# Patient Record
Sex: Male | Born: 1960
Health system: Southern US, Community
[De-identification: ages and names within clinical notes are randomized; demographics above are authoritative.]

## PROBLEM LIST (undated history)

## (undated) DIAGNOSIS — U071 COVID-19: Secondary | ICD-10-CM

## (undated) DIAGNOSIS — I10 Essential (primary) hypertension: Secondary | ICD-10-CM

## (undated) DIAGNOSIS — I493 Ventricular premature depolarization: Secondary | ICD-10-CM

## (undated) DIAGNOSIS — R0789 Other chest pain: Secondary | ICD-10-CM

## (undated) DIAGNOSIS — I4891 Unspecified atrial fibrillation: Secondary | ICD-10-CM

## (undated) DIAGNOSIS — R001 Bradycardia, unspecified: Secondary | ICD-10-CM

## (undated) DIAGNOSIS — K219 Gastro-esophageal reflux disease without esophagitis: Secondary | ICD-10-CM

## (undated) DIAGNOSIS — I491 Atrial premature depolarization: Secondary | ICD-10-CM

## (undated) DIAGNOSIS — Z8489 Family history of other specified conditions: Secondary | ICD-10-CM

## (undated) HISTORY — PX: NO PAST SURGERIES: SHX2092

## (undated) HISTORY — PX: COLONOSCOPY: SHX174

## (undated) HISTORY — DX: Other chest pain: R07.89

## (undated) HISTORY — DX: Ventricular premature depolarization: I49.3

## (undated) HISTORY — DX: Bradycardia, unspecified: R00.1

## (undated) HISTORY — DX: Gastro-esophageal reflux disease without esophagitis: K21.9

## (undated) HISTORY — DX: Atrial premature depolarization: I49.1

---

## 2007-01-04 ENCOUNTER — Ambulatory Visit (HOSPITAL_COMMUNITY): Admission: RE | Admit: 2007-01-04 | Discharge: 2007-01-04 | Payer: Self-pay | Admitting: Family Medicine

## 2009-12-24 ENCOUNTER — Encounter: Admission: RE | Admit: 2009-12-24 | Discharge: 2009-12-24 | Payer: Self-pay | Admitting: Neurosurgery

## 2012-03-27 DIAGNOSIS — Z125 Encounter for screening for malignant neoplasm of prostate: Secondary | ICD-10-CM | POA: Diagnosis not present

## 2012-03-27 DIAGNOSIS — Z23 Encounter for immunization: Secondary | ICD-10-CM | POA: Diagnosis not present

## 2012-03-27 DIAGNOSIS — J309 Allergic rhinitis, unspecified: Secondary | ICD-10-CM | POA: Diagnosis not present

## 2012-03-27 DIAGNOSIS — F329 Major depressive disorder, single episode, unspecified: Secondary | ICD-10-CM | POA: Diagnosis not present

## 2012-03-27 DIAGNOSIS — I10 Essential (primary) hypertension: Secondary | ICD-10-CM | POA: Diagnosis not present

## 2012-05-08 DIAGNOSIS — M47812 Spondylosis without myelopathy or radiculopathy, cervical region: Secondary | ICD-10-CM | POA: Diagnosis not present

## 2012-11-27 DIAGNOSIS — M47812 Spondylosis without myelopathy or radiculopathy, cervical region: Secondary | ICD-10-CM | POA: Diagnosis not present

## 2013-03-07 DIAGNOSIS — M47812 Spondylosis without myelopathy or radiculopathy, cervical region: Secondary | ICD-10-CM | POA: Diagnosis not present

## 2013-04-09 DIAGNOSIS — I10 Essential (primary) hypertension: Secondary | ICD-10-CM | POA: Diagnosis not present

## 2013-04-09 DIAGNOSIS — F329 Major depressive disorder, single episode, unspecified: Secondary | ICD-10-CM | POA: Diagnosis not present

## 2013-04-09 DIAGNOSIS — J019 Acute sinusitis, unspecified: Secondary | ICD-10-CM | POA: Diagnosis not present

## 2013-04-09 DIAGNOSIS — Z6834 Body mass index (BMI) 34.0-34.9, adult: Secondary | ICD-10-CM | POA: Diagnosis not present

## 2013-06-06 DIAGNOSIS — M47812 Spondylosis without myelopathy or radiculopathy, cervical region: Secondary | ICD-10-CM | POA: Diagnosis not present

## 2014-01-07 DIAGNOSIS — M47812 Spondylosis without myelopathy or radiculopathy, cervical region: Secondary | ICD-10-CM | POA: Diagnosis not present

## 2014-04-02 DIAGNOSIS — M47812 Spondylosis without myelopathy or radiculopathy, cervical region: Secondary | ICD-10-CM | POA: Diagnosis not present

## 2014-04-24 DIAGNOSIS — Z6834 Body mass index (BMI) 34.0-34.9, adult: Secondary | ICD-10-CM | POA: Diagnosis not present

## 2014-04-24 DIAGNOSIS — E669 Obesity, unspecified: Secondary | ICD-10-CM | POA: Diagnosis not present

## 2014-04-24 DIAGNOSIS — I10 Essential (primary) hypertension: Secondary | ICD-10-CM | POA: Diagnosis not present

## 2014-04-24 DIAGNOSIS — Z125 Encounter for screening for malignant neoplasm of prostate: Secondary | ICD-10-CM | POA: Diagnosis not present

## 2014-07-10 DIAGNOSIS — M47812 Spondylosis without myelopathy or radiculopathy, cervical region: Secondary | ICD-10-CM | POA: Diagnosis not present

## 2014-07-10 DIAGNOSIS — M25819 Other specified joint disorders, unspecified shoulder: Secondary | ICD-10-CM | POA: Diagnosis not present

## 2014-07-11 ENCOUNTER — Other Ambulatory Visit: Payer: Self-pay | Admitting: Neurosurgery

## 2014-07-11 DIAGNOSIS — M25819 Other specified joint disorders, unspecified shoulder: Secondary | ICD-10-CM

## 2014-08-06 ENCOUNTER — Ambulatory Visit
Admission: RE | Admit: 2014-08-06 | Discharge: 2014-08-06 | Disposition: A | Discharge status: Home / Self Care | Payer: Medicare Other | Source: Ambulatory Visit | Attending: Neurosurgery | Admitting: Neurosurgery

## 2014-08-06 DIAGNOSIS — M7500 Adhesive capsulitis of unspecified shoulder: Secondary | ICD-10-CM | POA: Diagnosis not present

## 2014-08-06 DIAGNOSIS — M6688 Spontaneous rupture of other tendons, other: Secondary | ICD-10-CM | POA: Diagnosis not present

## 2014-08-06 DIAGNOSIS — M25819 Other specified joint disorders, unspecified shoulder: Secondary | ICD-10-CM

## 2014-08-06 DIAGNOSIS — M67919 Unspecified disorder of synovium and tendon, unspecified shoulder: Secondary | ICD-10-CM | POA: Diagnosis not present

## 2014-08-21 DIAGNOSIS — M75120 Complete rotator cuff tear or rupture of unspecified shoulder, not specified as traumatic: Secondary | ICD-10-CM | POA: Diagnosis not present

## 2015-02-19 DIAGNOSIS — M4302 Spondylolysis, cervical region: Secondary | ICD-10-CM | POA: Diagnosis not present

## 2015-02-19 DIAGNOSIS — M25811 Other specified joint disorders, right shoulder: Secondary | ICD-10-CM | POA: Diagnosis not present

## 2015-03-24 DIAGNOSIS — E669 Obesity, unspecified: Secondary | ICD-10-CM | POA: Diagnosis not present

## 2015-03-24 DIAGNOSIS — F329 Major depressive disorder, single episode, unspecified: Secondary | ICD-10-CM | POA: Diagnosis not present

## 2015-03-24 DIAGNOSIS — Z6834 Body mass index (BMI) 34.0-34.9, adult: Secondary | ICD-10-CM | POA: Diagnosis not present

## 2015-03-24 DIAGNOSIS — I10 Essential (primary) hypertension: Secondary | ICD-10-CM | POA: Diagnosis not present

## 2015-03-24 DIAGNOSIS — K625 Hemorrhage of anus and rectum: Secondary | ICD-10-CM | POA: Diagnosis not present

## 2015-08-27 DIAGNOSIS — M25811 Other specified joint disorders, right shoulder: Secondary | ICD-10-CM | POA: Diagnosis not present

## 2015-08-27 DIAGNOSIS — M4302 Spondylolysis, cervical region: Secondary | ICD-10-CM | POA: Diagnosis not present

## 2016-02-25 DIAGNOSIS — M25811 Other specified joint disorders, right shoulder: Secondary | ICD-10-CM | POA: Diagnosis not present

## 2016-02-25 DIAGNOSIS — M4302 Spondylolysis, cervical region: Secondary | ICD-10-CM | POA: Diagnosis not present

## 2016-04-09 DIAGNOSIS — Z125 Encounter for screening for malignant neoplasm of prostate: Secondary | ICD-10-CM | POA: Diagnosis not present

## 2016-04-09 DIAGNOSIS — I10 Essential (primary) hypertension: Secondary | ICD-10-CM | POA: Diagnosis not present

## 2016-04-09 DIAGNOSIS — F329 Major depressive disorder, single episode, unspecified: Secondary | ICD-10-CM | POA: Diagnosis not present

## 2016-04-09 DIAGNOSIS — Z6833 Body mass index (BMI) 33.0-33.9, adult: Secondary | ICD-10-CM | POA: Diagnosis not present

## 2016-08-25 DIAGNOSIS — M25811 Other specified joint disorders, right shoulder: Secondary | ICD-10-CM | POA: Diagnosis not present

## 2016-08-25 DIAGNOSIS — M4302 Spondylolysis, cervical region: Secondary | ICD-10-CM | POA: Diagnosis not present

## 2017-03-29 DIAGNOSIS — M4302 Spondylolysis, cervical region: Secondary | ICD-10-CM | POA: Diagnosis not present

## 2017-03-29 DIAGNOSIS — M25811 Other specified joint disorders, right shoulder: Secondary | ICD-10-CM | POA: Diagnosis not present

## 2017-04-22 ENCOUNTER — Encounter (HOSPITAL_COMMUNITY): Payer: Self-pay

## 2017-04-22 ENCOUNTER — Emergency Department (HOSPITAL_COMMUNITY)
Admission: EM | Admit: 2017-04-22 | Discharge: 2017-04-22 | Disposition: A | Discharge status: Home / Self Care | Payer: Medicare Other | Attending: Emergency Medicine | Admitting: Emergency Medicine

## 2017-04-22 DIAGNOSIS — Z79899 Other long term (current) drug therapy: Secondary | ICD-10-CM | POA: Insufficient documentation

## 2017-04-22 DIAGNOSIS — R51 Headache: Secondary | ICD-10-CM | POA: Diagnosis not present

## 2017-04-22 DIAGNOSIS — I10 Essential (primary) hypertension: Secondary | ICD-10-CM | POA: Diagnosis not present

## 2017-04-22 DIAGNOSIS — R0789 Other chest pain: Secondary | ICD-10-CM | POA: Diagnosis not present

## 2017-04-22 DIAGNOSIS — R519 Headache, unspecified: Secondary | ICD-10-CM

## 2017-04-22 HISTORY — DX: Essential (primary) hypertension: I10

## 2017-04-22 LAB — I-STAT TROPONIN, ED: TROPONIN I, POC: 0.01 ng/mL (ref 0.00–0.08)

## 2017-04-22 NOTE — ED Notes (Signed)
Pt states understanding of care given and follow up instructions.  Pt alert and oriented.  Ambulated from ED with steady gait

## 2017-04-22 NOTE — ED Notes (Signed)
Pt states he does not need any medication for headache or nausea and that he is feeling much better

## 2017-04-22 NOTE — ED Provider Notes (Signed)
[facility] DEPT Provider Note   CSN: [id number] Arrival date & time: 04/22/17  0147  Time seen 03:00 AM   History   Chief Complaint Chief Complaint  Patient presents with  . Hypertension    HPI Ronald Conner is a 56 y.o. male.  HPI  patient states he has a history of hypertension. He states he has been eating healthy and losing weight so he decided to take one half of his blood pressure pills for about 3 or 4 months. He states his blood pressure is normally 962 to 229 systolic and his diastolic is usually around 80. About 3 days ago he decided to stop taking his blood pressure medication currently. He states today he's had a headache off-and-on all day. He states he took his blood pressure about 1 AM this morning and his blood pressure was 220/110. He states about 1:15 he took one whole lisinopril. He states he drank a lot of water. He also reports he took a hydrocodone at 1 AM and he had taken ibuprofen at 8 PM for his chronic neck pain. He states the headaches he had today were behind his eyes but the right was worse than left, he started seeing squiggly vision and he had trouble focusing his eyes. He describes the headache as throbbing. He states the last time he had that was when he had a very high blood pressure about a year ago. He states also about 1:00 he has chest felt tight and he felt like it was hard to breathe. This was about 1:00. He states it lasted about an hour. He denies any numbness or tingling of his extremities. He states since he took the lisinopril he is feeling better. He also complains of his right ear ringing and feeling full.  PCP Redmond [employer], MD   Past Medical History:  Diagnosis Date  . Hypertension     There are no active problems to display for this patient.   History reviewed. No pertinent surgical history.     Home Medications    Prior to Admission medications   Medication Sig Start Date End Date Taking? Authorizing Provider    HYDROcodone-acetaminophen (NORCO/VICODIN) 5-325 MG tablet Take 1 tablet by mouth every 6 (six) hours as needed for moderate pain.   Yes [provider]  lisinopril (PRINIVIL,ZESTRIL) 10 MG tablet Take 10 mg by mouth daily.   Yes [provider]    Family History No family history on file.  Social History Social History  Substance Use Topics  . Smoking status: Never Smoker  . Smokeless tobacco: Current User  . 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   Review of Systems Review of Systems  All other systems reviewed and are negative.    Physical Exam ED Triage Vitals  Enc Vitals Group     BP 04/22/17 0200 (!) 177/93     Pulse Rate 04/22/17 0200 (!) 50     Resp 04/22/17 0200 15     Temp 04/22/17 0200 98.2 F (36.8 C)     Temp Source 04/22/17 0200 Oral     SpO2 04/22/17 0200 100 %     Weight 04/22/17 0201 235 lb (106.6 kg)     Height 04/22/17 0201 5\' 10"  (1.778 m)     Head Circumference --      Peak Flow --      Pain Score 04/22/17 0159 7  Pain Loc --      Pain Edu? --      Excl. in [place]? --    Vital signs normal Except hypertension and bradycardia    Physical Exam  Constitutional: He is oriented to person, place, and time. He appears well-developed and well-nourished.  Non-toxic appearance. He does not appear ill. No distress.  HENT:  Head: Normocephalic and atraumatic.  Right Ear: External ear normal.  Left Ear: External ear normal.  Nose: Nose normal. No mucosal edema or rhinorrhea.  Mouth/Throat: Oropharynx is clear and moist and mucous membranes are normal. No dental abscesses or uvula swelling.  Right TM is normal  Eyes: Conjunctivae and EOM are normal. Pupils are equal, round, and reactive to light.  Neck: Normal range of motion and full passive range of motion without pain. Neck supple.  Cardiovascular: Normal rate, regular rhythm and normal heart sounds.   Exam reveals no gallop and no friction rub.   No murmur heard. Pulmonary/Chest: Effort normal and breath sounds normal. No respiratory distress. He has no wheezes. He has no rhonchi. He has no rales. He exhibits no tenderness and no crepitus.  Abdominal: Soft. Normal appearance and bowel sounds are normal. He exhibits no distension. There is no tenderness. There is no rebound and no guarding.  Musculoskeletal: Normal range of motion. He exhibits no edema or tenderness.  Moves all extremities well.   Neurological: He is alert and oriented to person, place, and time. He has normal strength. No cranial nerve deficit.  Skin: Skin is warm, dry and intact. No rash noted. No erythema. No pallor.  Psychiatric: He has a normal mood and affect. His speech is normal and behavior is normal. His mood appears not anxious.  Nursing note and vitals reviewed.    ED Treatments / Results  Labs (all labs ordered are listed, but only abnormal results are displayed) Results for orders placed or performed during the hospital encounter of 04/22/17  I-stat troponin, ED  Result Value Ref Range   Troponin i, poc 0.01 0.00 - 0.08 ng/mL   Comment 3           Laboratory interpretation all normal    EKG  EKG Interpretation  Date/Time:  Friday April 22 2017 02:50:26 EDT Ventricular Rate:  46 PR Interval:    QRS Duration: 112 QT Interval:  412 QTC Calculation: 361 R Axis:   -2 Text Interpretation:  Sinus bradycardia Multiple ventricular premature complexes Borderline intraventricular conduction delay No old tracing to compare Confirmed by Rolland Porter [phone]) on 04/22/2017 3:01:23 AM       Radiology No results found.  Procedures Procedures (including critical care time)  Medications Ordered in ED Medications - No data to display   Initial Impression / Assessment and Plan / ED Course  I have reviewed the triage vital signs and the nursing notes.  Pertinent labs & imaging results that were available  during my care of the patient were reviewed by me and considered in my medical decision making (see chart for details).   Patient's blood pressure improved to 135/78 in the ED from him taking his lisinopril at home. Patient's heart rate did drop down to 48. 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 He states at the time it dropped into the 40s he was feeling a little bit nauseated. Patient also reports multiple episodes of urinary output since he came to the ED however he does report a lot of  water prior to coming. He states his headache is almost gone and he does not have any chest pain. At this point he was felt stable to be discharged home.   Final Clinical Impressions(s) / ED Diagnoses   Final diagnoses:  Essential hypertension  Chest tightness  Nonintractable headache, unspecified chronicity pattern, unspecified headache type   Plan discharge  Rolland Porter, MD, Barbette Or, MD 04/22/17 [phone]

## 2017-04-22 NOTE — ED Notes (Signed)
Pt c/o nausea and "feeling strange".  Completed ED and put pt on cardiac monitor

## 2017-04-22 NOTE — Discharge Instructions (Signed)
Restart your blood pressure medication. Continue to monitor your blood pressure, recheck if you feel worse again.

## 2017-04-22 NOTE — ED Notes (Signed)
Pt states he is on Lisinopril 10mg  for HTN but has not been taking medication because his BP had been good.  Pt started getting headaches today and took his BP at home and discovered it was high.  Took Ibuprofen for headache and Lisinopril 10mg  for BP around 0100.

## 2017-04-22 NOTE — ED Triage Notes (Signed)
Pt states he has had a headache tonight and when he checked his blood pressure at home it was 220/110.   Pt states he took some ibuprofen for the headache which has improved.

## 2017-05-01 ENCOUNTER — Emergency Department (HOSPITAL_COMMUNITY)
Admission: EM | Admit: 2017-05-01 | Discharge: 2017-05-01 | Disposition: A | Discharge status: Home / Self Care | Payer: Medicare Other | Attending: Emergency Medicine | Admitting: Emergency Medicine

## 2017-05-01 ENCOUNTER — Encounter (HOSPITAL_COMMUNITY): Payer: Self-pay | Admitting: *Deleted

## 2017-05-01 DIAGNOSIS — F17200 Nicotine dependence, unspecified, uncomplicated: Secondary | ICD-10-CM | POA: Diagnosis not present

## 2017-05-01 DIAGNOSIS — R42 Dizziness and giddiness: Secondary | ICD-10-CM | POA: Diagnosis not present

## 2017-05-01 DIAGNOSIS — Z79899 Other long term (current) drug therapy: Secondary | ICD-10-CM | POA: Insufficient documentation

## 2017-05-01 DIAGNOSIS — E876 Hypokalemia: Secondary | ICD-10-CM | POA: Diagnosis not present

## 2017-05-01 DIAGNOSIS — I10 Essential (primary) hypertension: Secondary | ICD-10-CM | POA: Insufficient documentation

## 2017-05-01 DIAGNOSIS — R001 Bradycardia, unspecified: Secondary | ICD-10-CM

## 2017-05-01 DIAGNOSIS — I499 Cardiac arrhythmia, unspecified: Secondary | ICD-10-CM | POA: Diagnosis not present

## 2017-05-01 LAB — BASIC METABOLIC PANEL
Anion gap: 9 (ref 5–15)
BUN: 10 mg/dL (ref 6–20)
CHLORIDE: 102 mmol/L (ref 101–111)
CO2: 26 mmol/L (ref 22–32)
Calcium: 9 mg/dL (ref 8.9–10.3)
Creatinine, Ser: 0.8 mg/dL (ref 0.61–1.24)
GFR calc non Af Amer: >60 mL/min (ref >60)
Glucose, Bld: 126 mg/dL — ABNORMAL HIGH (ref 65–99)
POTASSIUM: 3.1 mmol/L — AB (ref 3.5–5.1)
SODIUM: 137 mmol/L (ref 135–145)

## 2017-05-01 LAB — MAGNESIUM: Magnesium: 2 mg/dL (ref 1.7–2.4)

## 2017-05-01 LAB — URINALYSIS, ROUTINE W REFLEX MICROSCOPIC
BACTERIA UA: NONE SEEN
BILIRUBIN URINE: NEGATIVE
Glucose, UA: NEGATIVE mg/dL
KETONES UR: NEGATIVE mg/dL
LEUKOCYTES UA: NEGATIVE
Nitrite: NEGATIVE
PROTEIN: NEGATIVE mg/dL
Specific Gravity, Urine: 1.018 (ref 1.005–1.030)
pH: 5 (ref 5.0–8.0)

## 2017-05-01 LAB — CBC
HEMATOCRIT: 43.6 % (ref 39.0–52.0)
Hemoglobin: 15.2 g/dL (ref 13.0–17.0)
MCH: 29.9 pg (ref 26.0–34.0)
MCHC: 34.9 g/dL (ref 30.0–36.0)
MCV: 85.8 fL (ref 78.0–100.0)
Platelets: 192 10*3/uL (ref 150–400)
RBC: 5.08 MIL/uL (ref 4.22–5.81)
RDW: 12.9 % (ref 11.5–15.5)
WBC: 6.5 10*3/uL (ref 4.0–10.5)

## 2017-05-01 LAB — TROPONIN I
Troponin I: <0.03 ng/mL (ref <0.03)
Troponin I: <0.03 ng/mL (ref <0.03)

## 2017-05-01 LAB — CBG MONITORING, ED: GLUCOSE-CAPILLARY: 123 mg/dL — AB (ref 65–99)

## 2017-05-01 MED ORDER — POTASSIUM CHLORIDE CRYS ER 20 MEQ PO TBCR: 40.0000 meq | EXTENDED_RELEASE_TABLET | Freq: Two times a day (BID) | ORAL | 0 refills | Status: DC

## 2017-05-01 MED ORDER — MAGNESIUM OXIDE 400 (241.3 MG) MG PO TABS
400.0000 mg | ORAL_TABLET | Freq: Once | ORAL | Status: AC
  Administered 2017-05-01: 400 mg via ORAL
  Filled 2017-05-01: qty 1

## 2017-05-01 MED ORDER — POTASSIUM CHLORIDE CRYS ER 20 MEQ PO TBCR
40.0000 meq | EXTENDED_RELEASE_TABLET | Freq: Once | ORAL | Status: AC
  Administered 2017-05-01: 40 meq via ORAL
  Filled 2017-05-01: qty 2

## 2017-05-01 NOTE — ED Notes (Signed)
Called pharmacy to request mag-ox

## 2017-05-01 NOTE — ED Notes (Signed)
Pt reports he feels weak/ dizziness after being active for a few minutes. Pt denies dizziness while laying on stretcher. Pt denies increase in symptoms upon standing. Pt denies chest pain or other sx at this time.

## 2017-05-01 NOTE — ED Notes (Signed)
Pt ambulated to bathroom, denied any weakness/ dizziness. Gait was stable.

## 2017-05-01 NOTE — ED Provider Notes (Signed)
Medical screening examination/treatment/procedure(s) were conducted as a shared visit with non-physician practitioner(s) and myself.  I personally evaluated the patient during the encounter.  He seems to be having exertional dizziness with nausea and diaphoresis. He has HR as low as 41 in ED for which he is asymptomatic. He walks around here without any problem but states at home it happens if he walks for more than 5-10 minutes. I don't think he needs admission but rather outpatient follow up as he is asymptomatic at this time. I asked him to call the office,   EKG Interpretation  Date/Time:  Sunday May 01 2017 09:50:36 EDT Ventricular Rate:  51 PR Interval:    QRS Duration: 115 QT Interval:  428 QTC Calculation: 395 R Axis:   -15 Text Interpretation:  Sinus rhythm Nonspecific intraventricular conduction delay No significant change since last tracing Confirmed by Merrily Pew [phone]) on 05/01/2017 9:54:40 AM           Roxann Vierra, Corene Cornea, MD 05/02/17 [phone]

## 2017-05-01 NOTE — ED Triage Notes (Signed)
Pt brought in by [facility] c/o dizziness and "not feeling right" for the last several hours. Pt had recent hypertensive crisis and has been taking his BP and HR daily at home. Pt's HR was ranging 48-56 this morning. Vitals otherwise normal. Denies SOB, denies pain.

## 2017-05-01 NOTE — ED Provider Notes (Signed)
[facility] DEPT Provider Note   CSN: [id number] Arrival date & time: 05/01/17  0947     History   Chief Complaint Chief Complaint  Patient presents with  . Dizziness    HPI Ronald Conner is a 56 y.o. male.  The history is provided by the patient. No language interpreter was used.  Dizziness  Quality:  Lightheadedness Severity:  Moderate Onset quality:  Gradual Timing:  Intermittent Progression:  Worsening Chronicity:  New Relieved by:  Nothing Worsened by:  Nothing Ineffective treatments:  None tried Associated symptoms: no chest pain   Risk factors: no multiple medications   Pt takes lisinopril for high blood pressure and hydrocodone for chronic neck pain.  Pt reports he had an episode of high blood pressure a week ago.  Pt reports he has had some dizziness since.  Pt reports his heart rate has been low.  Pt reports at his dizziest it was 5.  Pt reports blood pressure has been normal.  Pt reports he has a strange   Past Medical History:  Diagnosis Date  . Hypertension     There are no active problems to display for this patient.   History reviewed. No pertinent surgical history.     Home Medications    Prior to Admission medications   Medication Sig Start Date End Date Taking? Authorizing Provider  HYDROcodone-acetaminophen (NORCO/VICODIN) 5-325 MG tablet Take 1 tablet by mouth every 6 (six) hours as needed for moderate pain.   Yes [provider]  lisinopril (PRINIVIL,ZESTRIL) 10 MG tablet Take 10 mg by mouth daily.   Yes [provider]    Family History No family history on file.  Social History Social History  Substance Use Topics  . Smoking status: Never Smoker  . Smokeless tobacco: Current User  . Alcohol use No     Allergies   Patient has no known allergies.   Review of Systems Review of Systems  Cardiovascular: Negative for chest pain.  Neurological: Positive for dizziness.  All other systems reviewed and are  negative.    Physical Exam Updated Vital Signs BP 117/87   Pulse (!) 48   Temp 98.9 F (37.2 C) (Oral)   Resp 12   Ht 5\' 10"  (1.778 m)   Wt 106.6 kg (235 lb)   SpO2 97%   BMI 33.72 kg/m   Physical Exam  Constitutional: He appears well-developed and well-nourished.  Musculoskeletal: Normal range of motion.  Neurological: He is alert.  Skin: Skin is warm.  Psychiatric: He has a normal mood and affect.  Nursing note and vitals reviewed.    ED Treatments / Results  Labs (all labs ordered are listed, but only abnormal results are displayed) Labs Reviewed  BASIC METABOLIC PANEL - Abnormal; Notable for the following:       Result Value   Potassium 3.1 (*)    Glucose, Bld 126 (*)    All other components within normal limits  URINALYSIS, ROUTINE W REFLEX MICROSCOPIC - Abnormal; Notable for the following:    Hgb urine dipstick SMALL (*)    Squamous Epithelial / LPF 0-5 (*)    All other components within normal limits  CBG MONITORING, ED - Abnormal; Notable for the following:    Glucose-Capillary 123 (*)    All other components within normal limits  CBC  TROPONIN I    EKG  EKG Interpretation  Date/Time:  Sunday May 01 2017 09:50:36 EDT Ventricular Rate:  51 PR Interval:    QRS  Duration: 115 QT Interval:  428 QTC Calculation: 395 R Axis:   -15 Text Interpretation:  Sinus rhythm Nonspecific intraventricular conduction delay No significant change since last tracing Confirmed by Ronald Conner [phone]) on 05/01/2017 9:54:40 AM       Radiology No results found.  Procedures Procedures (including critical care time)  Medications Ordered in ED Medications  potassium chloride SA (K-DUR,KLOR-CON) CR tablet 40 mEq (not administered)     Initial Impression / Assessment and Plan / ED Course  I have reviewed the triage vital signs and the nursing notes.  Pertinent labs & imaging results that were available during my care of the patient were reviewed by me and  considered in my medical decision making (see chart for details).     Pt seen by Dr. Dolly Conner.  Pt advised to schedule to see cardiology for evaluation.   Final Clinical Impressions(s) / ED Diagnoses   Final diagnoses:  Dizziness  Bradycardia    New Prescriptions New Prescriptions   No medications on file   An After Visit Summary was printed and given to the patient.    Ronald Conner, [place] 05/01/17 1411    Ronald Pew, MD 05/01/17 [phone]

## 2017-05-02 ENCOUNTER — Telehealth: Payer: Self-pay

## 2017-05-02 NOTE — Telephone Encounter (Signed)
-----   Message from Desma Paganini sent at 05/02/2017 10:54 AM EDT ----- Regarding: RE: ASAP Scheduled for Friday @ 9.   ----- Message ----- From: Drema Dallas, CMA Sent: 05/02/2017  10:36 AM To: Desma Paganini Subject: ASAP                                           Hey,  Can we get him in ASAP. \  Thanks! Lattie Haw ----- Message ----- From: Arnoldo Lenis, MD Sent: 05/02/2017  10:12 AM To: Drema Dallas, CMA  Can we get this pt a new pt appt first available, can you let me know when that will be  Zandra Abts MD ----- Message ----- From: Merrily Pew, MD Sent: 05/01/2017   2:05 PM To: Arnoldo Lenis, MD  Dr. Harl Bowie,  I saw the above patient on Sunday in the ED. He seems to be having exertional dizziness with nausea and diaphoresis. He has HR as low as 41 in ED for which he is asymptomatic. He walks around here without any problem but states at home it happens if he walks for more than 5-10 minutes. I don't think he needs admission but rather outpatient follow up as he is asymptomatic at this time. I asked him to call the office, but I wanted to send a note to someone to express my concern and ask for anyone to see him at their earliest convenience. Thank You.  Corene Cornea Mesner

## 2017-05-02 NOTE — Telephone Encounter (Signed)
Pt's wife made aware. 

## 2017-05-02 NOTE — Telephone Encounter (Signed)
Pt's wife made aware of appointment.

## 2017-05-02 NOTE — Telephone Encounter (Signed)
Called pt. No answer, left message for pt to return my call. 

## 2017-05-06 ENCOUNTER — Ambulatory Visit (INDEPENDENT_AMBULATORY_CARE_PROVIDER_SITE_OTHER): Payer: Medicare Other | Admitting: Cardiology

## 2017-05-06 ENCOUNTER — Encounter: Payer: Self-pay | Admitting: Cardiology

## 2017-05-06 ENCOUNTER — Other Ambulatory Visit (HOSPITAL_COMMUNITY)
Admission: RE | Admit: 2017-05-06 | Discharge: 2017-05-06 | Disposition: A | Discharge status: Home / Self Care | Payer: Medicare Other | Source: Ambulatory Visit | Attending: Cardiology | Admitting: Cardiology

## 2017-05-06 VITALS — BP 116/76 | HR 49 | Ht 70.0 in | Wt 231.0 lb

## 2017-05-06 DIAGNOSIS — I10 Essential (primary) hypertension: Secondary | ICD-10-CM

## 2017-05-06 DIAGNOSIS — R001 Bradycardia, unspecified: Secondary | ICD-10-CM

## 2017-05-06 DIAGNOSIS — I493 Ventricular premature depolarization: Secondary | ICD-10-CM | POA: Diagnosis not present

## 2017-05-06 LAB — BASIC METABOLIC PANEL
ANION GAP: 9 (ref 5–15)
BUN: 11 mg/dL (ref 6–20)
CHLORIDE: 102 mmol/L (ref 101–111)
CO2: 27 mmol/L (ref 22–32)
Calcium: 9.6 mg/dL (ref 8.9–10.3)
Creatinine, Ser: 0.98 mg/dL (ref 0.61–1.24)
GFR calc non Af Amer: >60 mL/min (ref >60)
Glucose, Bld: 115 mg/dL — ABNORMAL HIGH (ref 65–99)
POTASSIUM: 3.8 mmol/L (ref 3.5–5.1)
SODIUM: 138 mmol/L (ref 135–145)

## 2017-05-06 LAB — MAGNESIUM: Magnesium: 2.2 mg/dL (ref 1.7–2.4)

## 2017-05-06 LAB — TSH: TSH: 0.817 u[IU]/mL (ref 0.350–4.500)

## 2017-05-06 MED ORDER — LISINOPRIL 20 MG PO TABS: 20.0000 mg | ORAL_TABLET | Freq: Every day | ORAL | 3 refills | Status: DC

## 2017-05-06 NOTE — Patient Instructions (Signed)
Your physician recommends that you schedule a follow-up appointment in: 3-4 weeks    STOP Potassium  STOP Prinzide   START Lisinopril 20 mg daily    GET lab work now: bmet,tsh,mg+      Thank you for choosing [facility] !

## 2017-05-06 NOTE — Progress Notes (Signed)
Clinical Summary Mr. Zunker is a 56 y.o.male  seen today as a new patient, he is referred by Dr Gerarda Fraction for bradycardia and dizzines.   1. Bradycardia/Dizziness - he reports his heart rates chronically being in the 50-60s - recent ER visit with heart rates in low 40s - symptoms started a few months ago.  - episodes of fatigue, low energy.  - episode 2 weeks ago, onset of headache and chest feeling funny. Checked bp 228/113 had not taken meds - warm like feeling in chest, "felt funny". +nausea. No N/V/D. Had to urinate a lot every few minutes. Blurry vision   2. Hypokalemia - noted during recent ER visit.   3. HTN - stop HCTZ, increase lisionpril to 20mg  daily   SH: retired Event organiser.     Past Medical History:  Diagnosis Date  . Hypertension      No Known Allergies   Current Outpatient Prescriptions  Medication Sig Dispense Refill  . HYDROcodone-acetaminophen (NORCO/VICODIN) 5-325 MG tablet Take 1-2 tablets by mouth every 12 (twelve) hours as needed for moderate pain.     Marland Kitchen lisinopril (PRINIVIL,ZESTRIL) 20 MG tablet Take 1 tablet (20 mg total) by mouth daily. 90 tablet 3   No current facility-administered medications for this visit.      Past Surgical History:  Procedure Laterality Date  . NO PAST SURGERIES       No Known Allergies    Family History  Problem Relation Age of Onset  . Arrhythmia Mother        h/o DCCV  . Leukemia Mother   . Hypertension Mother   . Cataracts Mother   . Peripheral Artery Disease Mother   . Heart attack Father 88  . Kidney failure Brother        on dialysis  . Hypertension Brother   . Arrhythmia Maternal Grandmother   . Heart attack Maternal Grandmother   . Stroke Maternal Grandfather   . Peripheral Artery Disease Maternal Grandfather        double amputee  . Dementia Paternal Grandmother   . Diabetes Brother   . Obesity Brother      Social History Mr. Scalisi reports that he has never smoked. His  smokeless tobacco use includes Snuff. Mr. Bracey reports that he does not drink alcohol.   Review of Systems CONSTITUTIONAL: No weight loss, fever, chills, weakness or fatigue.  HEENT: Eyes: No visual loss, blurred vision, double vision or yellow sclerae.No hearing loss, sneezing, congestion, runny nose or sore throat.  SKIN: No rash or itching.  CARDIOVASCULAR: per hpi RESPIRATORY: No shortness of breath, cough or sputum.  GASTROINTESTINAL:per hpi.  GENITOURINARY: No burning on urination, no polyuria NEUROLOGICAL: No headache, dizziness, syncope, paralysis, ataxia, numbness or tingling in the extremities. No change in bowel or bladder control.  MUSCULOSKELETAL: No muscle, back pain, joint pain or stiffness.  LYMPHATICS: No enlarged nodes. No history of splenectomy.  PSYCHIATRIC: No history of depression or anxiety.  ENDOCRINOLOGIC: No reports of sweating, cold or heat intolerance. No polyuria or polydipsia.  Marland Kitchen   Physical Examination Vitals:   05/06/17 0852  BP: 116/76  Pulse: (!) 49   Filed Weights   05/06/17 0852  Weight: 231 lb (104.8 kg)    Gen: resting comfortably, no acute distress HEENT: no scleral icterus, pupils equal round and reactive, no palptable cervical adenopathy,  CV: RRR, no m/r/g no jvd Resp: Clear to auscultation bilaterally GI: abdomen is soft, non-tender, non-distended, normal bowel sounds,  no hepatosplenomegaly MSK: extremities are warm, no edema.  Skin: warm, no rash Neuro:  no focal deficits Psych: appropriate affect     Assessment and Plan  1. Bradycardia - history of chronic bradycardia per his report 50-60s - recent episode of heart rate down to 40s by his home monitor, ER visit at the time actually showed SR with PVCs, I suspect the rate of 40s was due to undetected PVCs by his monitor - hypokalemic during his ER visit, I suspect contributed to his PVCs. He reports his symptoms have improved since taking potassium at home -continue to  monitor at this time. If recurrent symptoms consider heart monitor  2. Hypokalemia - we will d/c his HCTZ, increase lisinopril to 20mg  daily - repeat labs, likely d/c his potassium supplement pending results   F/u 1 month. If persistent symptoms consider heart montior.       Arnoldo Lenis, M.D.

## 2017-05-17 DIAGNOSIS — E876 Hypokalemia: Secondary | ICD-10-CM | POA: Diagnosis not present

## 2017-05-17 DIAGNOSIS — Z6833 Body mass index (BMI) 33.0-33.9, adult: Secondary | ICD-10-CM | POA: Diagnosis not present

## 2017-05-17 DIAGNOSIS — Z1389 Encounter for screening for other disorder: Secondary | ICD-10-CM | POA: Diagnosis not present

## 2017-05-17 DIAGNOSIS — F329 Major depressive disorder, single episode, unspecified: Secondary | ICD-10-CM | POA: Diagnosis not present

## 2017-05-17 DIAGNOSIS — Z125 Encounter for screening for malignant neoplasm of prostate: Secondary | ICD-10-CM | POA: Diagnosis not present

## 2017-05-17 DIAGNOSIS — I10 Essential (primary) hypertension: Secondary | ICD-10-CM | POA: Diagnosis not present

## 2017-05-17 DIAGNOSIS — R001 Bradycardia, unspecified: Secondary | ICD-10-CM | POA: Diagnosis not present

## 2017-05-21 DIAGNOSIS — Z1211 Encounter for screening for malignant neoplasm of colon: Secondary | ICD-10-CM | POA: Diagnosis not present

## 2017-05-23 ENCOUNTER — Emergency Department (HOSPITAL_COMMUNITY): Payer: Medicare Other

## 2017-05-23 ENCOUNTER — Encounter (HOSPITAL_COMMUNITY): Payer: Self-pay | Admitting: *Deleted

## 2017-05-23 ENCOUNTER — Emergency Department (HOSPITAL_COMMUNITY)
Admission: EM | Admit: 2017-05-23 | Discharge: 2017-05-23 | Disposition: A | Discharge status: Home / Self Care | Payer: Medicare Other | Attending: Emergency Medicine | Admitting: Emergency Medicine

## 2017-05-23 DIAGNOSIS — F17220 Nicotine dependence, chewing tobacco, uncomplicated: Secondary | ICD-10-CM | POA: Diagnosis not present

## 2017-05-23 DIAGNOSIS — R42 Dizziness and giddiness: Secondary | ICD-10-CM | POA: Diagnosis not present

## 2017-05-23 DIAGNOSIS — R531 Weakness: Secondary | ICD-10-CM | POA: Insufficient documentation

## 2017-05-23 DIAGNOSIS — Z79899 Other long term (current) drug therapy: Secondary | ICD-10-CM | POA: Diagnosis not present

## 2017-05-23 DIAGNOSIS — I10 Essential (primary) hypertension: Secondary | ICD-10-CM | POA: Diagnosis not present

## 2017-05-23 DIAGNOSIS — R079 Chest pain, unspecified: Secondary | ICD-10-CM | POA: Diagnosis not present

## 2017-05-23 DIAGNOSIS — R404 Transient alteration of awareness: Secondary | ICD-10-CM | POA: Diagnosis not present

## 2017-05-23 LAB — MAGNESIUM: MAGNESIUM: 2 mg/dL (ref 1.7–2.4)

## 2017-05-23 LAB — CBC
HEMATOCRIT: 44.9 % (ref 39.0–52.0)
HEMOGLOBIN: 15.6 g/dL (ref 13.0–17.0)
MCH: 29.7 pg (ref 26.0–34.0)
MCHC: 34.7 g/dL (ref 30.0–36.0)
MCV: 85.5 fL (ref 78.0–100.0)
Platelets: 208 10*3/uL (ref 150–400)
RBC: 5.25 MIL/uL (ref 4.22–5.81)
RDW: 12.6 % (ref 11.5–15.5)
WBC: 8.9 10*3/uL (ref 4.0–10.5)

## 2017-05-23 LAB — URINALYSIS, ROUTINE W REFLEX MICROSCOPIC
BACTERIA UA: NONE SEEN
BILIRUBIN URINE: NEGATIVE
Glucose, UA: NEGATIVE mg/dL
KETONES UR: NEGATIVE mg/dL
Leukocytes, UA: NEGATIVE
NITRITE: NEGATIVE
PROTEIN: NEGATIVE mg/dL
SPECIFIC GRAVITY, URINE: 1.008 (ref 1.005–1.030)
SQUAMOUS EPITHELIAL / LPF: NONE SEEN
pH: 5 (ref 5.0–8.0)

## 2017-05-23 LAB — BASIC METABOLIC PANEL
ANION GAP: 7 (ref 5–15)
BUN: 11 mg/dL (ref 6–20)
CHLORIDE: 105 mmol/L (ref 101–111)
CO2: 27 mmol/L (ref 22–32)
Calcium: 9.1 mg/dL (ref 8.9–10.3)
Creatinine, Ser: 0.94 mg/dL (ref 0.61–1.24)
GFR calc Af Amer: >60 mL/min (ref >60)
GFR calc non Af Amer: >60 mL/min (ref >60)
Glucose, Bld: 100 mg/dL — ABNORMAL HIGH (ref 65–99)
POTASSIUM: 4.2 mmol/L (ref 3.5–5.1)
SODIUM: 139 mmol/L (ref 135–145)

## 2017-05-23 LAB — TROPONIN I

## 2017-05-23 LAB — LIPASE, BLOOD: Lipase: 36 U/L (ref 11–51)

## 2017-05-23 MED ORDER — LACTATED RINGERS IV BOLUS (SEPSIS)
1000.0000 mL | Freq: Once | INTRAVENOUS | Status: AC
  Administered 2017-05-23: 1000 mL via INTRAVENOUS

## 2017-05-23 NOTE — ED Triage Notes (Signed)
Pt comes in by EMS from home for weakness startgin today. Pt was here around 1 month ago for weakness as well, he was dx with hypokalemia. Pt having pain in his chest at this time.

## 2017-05-23 NOTE — ED Provider Notes (Signed)
[facility] DEPT Provider Note   CSN: [id number] Arrival date & time: 05/23/17  1504     History   Chief Complaint Chief Complaint  Patient presents with  . Weakness    HPI Ronald SCHLAFER is a 56 y.o. male.   Weakness  Primary symptoms include dizziness.  Primary symptoms include no focal weakness, no speech change. This is a recurrent problem. The current episode started less than 1 hour ago. The problem has been rapidly improving. There was no focality noted. There has been no fever.    Past Medical History:  Diagnosis Date  . Hypertension     There are no active problems to display for this patient.   Past Surgical History:  Procedure Laterality Date  . NO PAST SURGERIES         Home Medications    Prior to Admission medications   Medication Sig Start Date End Date Taking? Authorizing Provider  HYDROcodone-acetaminophen (NORCO/VICODIN) 5-325 MG tablet Take 1-2 tablets by mouth every 12 (twelve) hours as needed for moderate pain.     [provider]  lisinopril (PRINIVIL,ZESTRIL) 20 MG tablet Take 1 tablet (20 mg total) by mouth daily. 05/06/17 08/04/17  Arnoldo Lenis, MD    Family History Family History  Problem Relation Age of Onset  . Arrhythmia Mother        h/o DCCV  . Leukemia Mother   . Hypertension Mother   . Cataracts Mother   . Peripheral Artery Disease Mother   . Heart attack Father 16  . Kidney failure Brother        on dialysis  . Hypertension Brother   . Arrhythmia Maternal Grandmother   . Heart attack Maternal Grandmother   . Stroke Maternal Grandfather   . Peripheral Artery Disease Maternal Grandfather        double amputee  . Dementia Paternal Grandmother   . Diabetes Brother   . Obesity Brother     Social History Social History  Substance Use Topics  . Smoking status: Never Smoker  . Smokeless tobacco: Current User    Types: Snuff     Comment: dips snuff for 25 years  . Alcohol use No     Allergies     Patient has no known allergies.   Review of Systems Review of Systems  Neurological: Positive for dizziness and weakness. Negative for speech change and focal weakness.  All other systems reviewed and are negative.    Physical Exam Updated Vital Signs BP 125/81   Pulse (!) 51   Temp 98.2 F (36.8 C) (Oral)   Resp 16   Ht 5\' 10"  (1.778 m)   Wt 104.8 kg (231 lb)   SpO2 97%   BMI 33.15 kg/m   Physical Exam  Constitutional: He is oriented to person, place, and time. He appears well-developed and well-nourished.  HENT:  Head: Normocephalic and atraumatic.  Eyes: Conjunctivae and EOM are normal. Pupils are equal, round, and reactive to light.  Neck: Normal range of motion.  Cardiovascular: Regular rhythm.  Bradycardia present.  Exam reveals no gallop and no friction rub.   No murmur heard. Pulmonary/Chest: Effort normal. No respiratory distress. He has no wheezes. He has no rales.  Abdominal: Soft. He exhibits no distension.  Musculoskeletal: Normal range of motion.  Neurological: He is alert and oriented to person, place, and time. No cranial nerve deficit. Coordination normal.  Skin: Skin is warm and dry.  Nursing note and vitals reviewed.  ED Treatments / Results  Labs (all labs ordered are listed, but only abnormal results are displayed) Labs Reviewed  BASIC METABOLIC PANEL - Abnormal; Notable for the following:       Result Value   Glucose, Bld 100 (*)    All other components within normal limits  URINALYSIS, ROUTINE W REFLEX MICROSCOPIC - Abnormal; Notable for the following:    Color, Urine STRAW (*)    Hgb urine dipstick SMALL (*)    All other components within normal limits  CBC  MAGNESIUM  TROPONIN I  LIPASE, BLOOD    EKG  EKG Interpretation  Date/Time:  Monday May 23 2017 15:04:46 EDT Ventricular Rate:  47 PR Interval:    QRS Duration: 114 QT Interval:  425 QTC Calculation: 376 R Axis:   34 Text Interpretation:  Sinus bradycardia Borderline  intraventricular conduction delay Low voltage, precordial leads slightly improved bradycardia from previously Confirmed by Merrily Pew [phone]) on 05/23/2017 3:18:14 PM       Radiology Dg Chest 2 View  Result Date: 05/23/2017 CLINICAL DATA:  Mid lower retrosternal chest pain associated with weakness and lightheadedness and clamminess. EXAM: CHEST  2 VIEW COMPARISON:  None in PACs FINDINGS: The lungs are adequately inflated. There is no focal infiltrate. There is no pleural effusion. The heart and pulmonary vascularity are normal. The mediastinum is normal in width. The bony thorax exhibits no acute abnormality. IMPRESSION: There is no acute cardiopulmonary abnormality. Electronically Signed   By: David  Martinique M.D.   On: 05/23/2017 16:07    Procedures Procedures (including critical care time)  Medications Ordered in ED Medications  lactated ringers bolus 1,000 mL (1,000 mLs Intravenous New Bag/Given 05/23/17 1535)     Initial Impression / Assessment and Plan / ED Course  I have reviewed the triage vital signs and the nursing notes.  Pertinent labs & imaging results that were available during my care of the patient were reviewed by me and considered in my medical decision making (see chart for details).    Episode of light headedness and overall weakness surrounding time of having nausea, belching and a BM. Mild midline chest/abdomen pressure as well.  Able to ambulate in ER multiple times. Not sure what the cause of his symptoms are but low suspicion for neurologic cause. HR improved from previous visit and seems to be managing that with cardiology appropriately so not likely contributing either. May have just been a vagal response from BM? Either way, stable for discharge with pcp follow up at this time.   Final Clinical Impressions(s) / ED Diagnoses   Final diagnoses:  Weakness    New Prescriptions New Prescriptions   No medications on file     Vesna Kable, Corene Cornea, MD 05/23/17  1659

## 2017-05-23 NOTE — ED Notes (Signed)
Pt ambulated with no difficulties noted. No assist needed.

## 2017-06-06 ENCOUNTER — Encounter: Payer: Self-pay | Admitting: Cardiology

## 2017-06-06 ENCOUNTER — Ambulatory Visit (INDEPENDENT_AMBULATORY_CARE_PROVIDER_SITE_OTHER): Payer: Medicare Other | Admitting: Cardiology

## 2017-06-06 VITALS — BP 148/98 | HR 62 | Ht 70.0 in | Wt 235.0 lb

## 2017-06-06 DIAGNOSIS — I493 Ventricular premature depolarization: Secondary | ICD-10-CM

## 2017-06-06 DIAGNOSIS — I10 Essential (primary) hypertension: Secondary | ICD-10-CM | POA: Diagnosis not present

## 2017-06-06 DIAGNOSIS — R001 Bradycardia, unspecified: Secondary | ICD-10-CM

## 2017-06-06 DIAGNOSIS — E876 Hypokalemia: Secondary | ICD-10-CM

## 2017-06-06 NOTE — Patient Instructions (Signed)
Your physician wants you to follow-up in: 6 months with Dr Branch You will receive a reminder letter in the mail two months in advance. If you don't receive a letter, please call our office to schedule the follow-up appointment.     Your physician recommends that you continue on your current medications as directed. Please refer to the Current Medication list given to you today.    If you need a refill on your cardiac medications before your next appointment, please call your pharmacy.     No testing or labs ordered today.      Thank you for choosing [facility] !         

## 2017-06-06 NOTE — Progress Notes (Signed)
Clinical Summary Mr. Beeks is a 56 y.o.male seen today for follow up of the following medical problems.   1 . Bradycardia/Dizziness - he reports his heart rates chronically being in the 50-60s - recent ER visit with heart rates in low 40s - symptoms started a few months ago.  - episodes of fatigue, low energy.  - episode 2 weeks ago, onset of headache and chest feeling funny. Checked bp 228/113 had not taken meds - warm like feeling in chest, "felt funny". +nausea. No N/V/D. Had to urinate a lot every few minutes. Blurry vision  - repeat episode of weakness, seen in ER. Possible vagal response. He reports it was severe heart burn and indigestion.Resolved with passing gas.  - no recurrent episodes   2. Hypokalemia - noted during recent ER visit.  - resolved after stopping thiazide diuretic  3. HTN - stop HCTZ, increase lisionpril to 20mg  daily at last visit - home bp's running around 110-120s/60-70s    SH: retired Event organiser Past Medical History:  Diagnosis Date  . Hypertension      No Known Allergies   Current Outpatient Prescriptions  Medication Sig Dispense Refill  . HYDROcodone-acetaminophen (NORCO/VICODIN) 5-325 MG tablet Take 1-2 tablets by mouth every 12 (twelve) hours as needed for moderate pain.     Marland Kitchen lisinopril (PRINIVIL,ZESTRIL) 20 MG tablet Take 1 tablet (20 mg total) by mouth daily. 90 tablet 3   No current facility-administered medications for this visit.      Past Surgical History:  Procedure Laterality Date  . NO PAST SURGERIES       No Known Allergies    Family History  Problem Relation Age of Onset  . Arrhythmia Mother        h/o DCCV  . Leukemia Mother   . Hypertension Mother   . Cataracts Mother   . Peripheral Artery Disease Mother   . Heart attack Father 69  . Kidney failure Brother        on dialysis  . Hypertension Brother   . Arrhythmia Maternal Grandmother   . Heart attack Maternal Grandmother   . Stroke  Maternal Grandfather   . Peripheral Artery Disease Maternal Grandfather        double amputee  . Dementia Paternal Grandmother   . Diabetes Brother   . Obesity Brother      Social History Mr. Noyola reports that he has never smoked. His smokeless tobacco use includes Snuff. Mr. Novosad reports that he does not drink alcohol.   Review of Systems CONSTITUTIONAL: No weight loss, fever, chills, weakness or fatigue.  HEENT: Eyes: No visual loss, blurred vision, double vision or yellow sclerae.No hearing loss, sneezing, congestion, runny nose or sore throat.  SKIN: No rash or itching.  CARDIOVASCULAR: no chest pain, no palpitations.  RESPIRATORY: No shortness of breath, cough or sputum.  GASTROINTESTINAL: No anorexia, nausea, vomiting or diarrhea. No abdominal pain or blood.  GENITOURINARY: No burning on urination, no polyuria NEUROLOGICAL: No headache, dizziness, syncope, paralysis, ataxia, numbness or tingling in the extremities. No change in bowel or bladder control.  MUSCULOSKELETAL: No muscle, back pain, joint pain or stiffness.  LYMPHATICS: No enlarged nodes. No history of splenectomy.  PSYCHIATRIC: No history of depression or anxiety.  ENDOCRINOLOGIC: No reports of sweating, cold or heat intolerance. No polyuria or polydipsia.  Marland Kitchen   Physical Examination Vitals:   06/06/17 0901  BP: (!) 148/98  Pulse: 62   Vitals:   06/06/17 0901  Weight: 235  lb (106.6 kg)  Height: 5\' 10"  (1.778 m)    Gen: resting comfortably, no acute distress HEENT: no scleral icterus, pupils equal round and reactive, no palptable cervical adenopathy,  CV: RRR, no m/r/g, no jvd Resp: Clear to auscultation bilaterally GI: abdomen is soft, non-tender, non-distended, normal bowel sounds, no hepatosplenomegaly MSK: extremities are warm, no edema.  Skin: warm, no rash Neuro:  no focal deficits Psych: appropriate affect      Assessment and Plan   1. Bradycardia - history of chronic bradycardia  per his report 50-60s - recent episode of heart rate down to 40s by his home monitor, ER visit at the time actually showed SR with PVCs, I suspect the rate of 40s was due to undetected PVCs by his monitor - hypokalemic during his ER visit, I suspect contributed to his PVCs. He reports his symptoms have improved since taking potassium at home  - overall doing well, isolated episode of dizziness probably vagal response. We will continue to montior at this time.   2. Hypokalemia - resolved off HCTZ, continue to monitor  3. HTN - at goal by home numbers, continue current meds   F/u 6 months     Arnoldo Lenis, M.D

## 2017-08-26 ENCOUNTER — Emergency Department (HOSPITAL_COMMUNITY)
Admission: EM | Admit: 2017-08-26 | Discharge: 2017-08-26 | Disposition: A | Discharge status: Home / Self Care | Payer: Medicare Other | Attending: Emergency Medicine | Admitting: Emergency Medicine

## 2017-08-26 ENCOUNTER — Encounter (HOSPITAL_COMMUNITY): Payer: Self-pay | Admitting: Emergency Medicine

## 2017-08-26 ENCOUNTER — Emergency Department (HOSPITAL_COMMUNITY): Payer: Medicare Other

## 2017-08-26 DIAGNOSIS — R0789 Other chest pain: Secondary | ICD-10-CM | POA: Diagnosis not present

## 2017-08-26 DIAGNOSIS — F17220 Nicotine dependence, chewing tobacco, uncomplicated: Secondary | ICD-10-CM | POA: Insufficient documentation

## 2017-08-26 DIAGNOSIS — R079 Chest pain, unspecified: Secondary | ICD-10-CM | POA: Diagnosis not present

## 2017-08-26 DIAGNOSIS — Z7982 Long term (current) use of aspirin: Secondary | ICD-10-CM | POA: Insufficient documentation

## 2017-08-26 DIAGNOSIS — I10 Essential (primary) hypertension: Secondary | ICD-10-CM | POA: Diagnosis not present

## 2017-08-26 DIAGNOSIS — Z79899 Other long term (current) drug therapy: Secondary | ICD-10-CM | POA: Insufficient documentation

## 2017-08-26 DIAGNOSIS — R072 Precordial pain: Secondary | ICD-10-CM | POA: Diagnosis not present

## 2017-08-26 LAB — BASIC METABOLIC PANEL
Anion gap: 12 (ref 5–15)
BUN: 12 mg/dL (ref 6–20)
CHLORIDE: 107 mmol/L (ref 101–111)
CO2: 21 mmol/L — AB (ref 22–32)
Calcium: 9.3 mg/dL (ref 8.9–10.3)
Creatinine, Ser: 0.99 mg/dL (ref 0.61–1.24)
GFR calc Af Amer: >60 mL/min (ref >60)
GFR calc non Af Amer: >60 mL/min (ref >60)
GLUCOSE: 90 mg/dL (ref 65–99)
POTASSIUM: 3.3 mmol/L — AB (ref 3.5–5.1)
Sodium: 140 mmol/L (ref 135–145)

## 2017-08-26 LAB — CBC
HEMATOCRIT: 42.9 % (ref 39.0–52.0)
HEMOGLOBIN: 14.7 g/dL (ref 13.0–17.0)
MCH: 29.5 pg (ref 26.0–34.0)
MCHC: 34.3 g/dL (ref 30.0–36.0)
MCV: 86 fL (ref 78.0–100.0)
Platelets: 214 10*3/uL (ref 150–400)
RBC: 4.99 MIL/uL (ref 4.22–5.81)
RDW: 13.4 % (ref 11.5–15.5)
WBC: 7.4 10*3/uL (ref 4.0–10.5)

## 2017-08-26 LAB — CK: Total CK: 409 U/L — ABNORMAL HIGH (ref 49–397)

## 2017-08-26 LAB — HEPATIC FUNCTION PANEL
ALK PHOS: 61 U/L (ref 38–126)
ALT: 24 U/L (ref 17–63)
AST: 33 U/L (ref 15–41)
Albumin: 4.3 g/dL (ref 3.5–5.0)
BILIRUBIN INDIRECT: 1 mg/dL — AB (ref 0.3–0.9)
Bilirubin, Direct: 0.2 mg/dL (ref 0.1–0.5)
TOTAL PROTEIN: 7.3 g/dL (ref 6.5–8.1)
Total Bilirubin: 1.2 mg/dL (ref 0.3–1.2)

## 2017-08-26 LAB — I-STAT TROPONIN, ED: Troponin i, poc: 0 ng/mL (ref 0.00–0.08)

## 2017-08-26 LAB — TROPONIN I

## 2017-08-26 MED ORDER — POTASSIUM CHLORIDE CRYS ER 20 MEQ PO TBCR
EXTENDED_RELEASE_TABLET | ORAL | Status: AC
  Filled 2017-08-26: qty 2

## 2017-08-26 MED ORDER — SODIUM CHLORIDE 0.9 % IV BOLUS (SEPSIS)
1000.0000 mL | Freq: Once | INTRAVENOUS | Status: AC
  Administered 2017-08-26: 1000 mL via INTRAVENOUS

## 2017-08-26 MED ORDER — POTASSIUM CHLORIDE CRYS ER 20 MEQ PO TBCR
40.0000 meq | EXTENDED_RELEASE_TABLET | Freq: Once | ORAL | Status: AC
  Administered 2017-08-26: 40 meq via ORAL
  Filled 2017-08-26: qty 2

## 2017-08-26 NOTE — ED Provider Notes (Signed)
[facility] DEPT Provider Note   CSN: [id number] Arrival date & time: 08/26/17  1419     History   Chief Complaint Chief Complaint  Patient presents with  . Chest Pain    HPI Ronald Conner is a 56 y.o. male.     Patient complains of chest pain earlier today when he was working out in the yard. Patient became very diaphoretic.   The history is provided by the patient. No language interpreter was used.  Chest Pain   This is a new problem. The current episode started 6 to 12 hours ago. The problem occurs rarely. The problem has been resolved. The pain is associated with exertion. The pain is present in the substernal region. The pain is at a severity of 3/10. The pain is moderate. The quality of the pain is described as brief. The pain does not radiate. The symptoms are aggravated by exertion. Pertinent negatives include no abdominal pain, no back pain, no cough and no headaches. He has tried nothing for the symptoms. The treatment provided significant relief.  Pertinent negatives for past medical history include no seizures.    Past Medical History:  Diagnosis Date  . Hypertension     There are no active problems to display for this patient.   Past Surgical History:  Procedure Laterality Date  . NO PAST SURGERIES         Home Medications    Prior to Admission medications   Medication Sig Start Date End Date Taking? Authorizing Provider  aspirin EC 81 MG tablet Take 324 mg by mouth once.   Yes [provider]  HYDROcodone-acetaminophen (NORCO/VICODIN) 5-325 MG tablet Take 1-2 tablets by mouth every 12 (twelve) hours as needed for moderate pain.    Yes [provider]  lisinopril (PRINIVIL,ZESTRIL) 20 MG tablet Take 1 tablet (20 mg total) by mouth daily. 05/06/17 08/26/17 Yes Branch, Alphonse Guild, MD    Family History Family History  Problem Relation Age of Onset  . Arrhythmia Mother        h/o DCCV  . Leukemia Mother   . Hypertension Mother     . Cataracts Mother   . Peripheral Artery Disease Mother   . Heart attack Father 71  . Kidney failure Brother        on dialysis  . Hypertension Brother   . Arrhythmia Maternal Grandmother   . Heart attack Maternal Grandmother   . Stroke Maternal Grandfather   . Peripheral Artery Disease Maternal Grandfather        double amputee  . Dementia Paternal Grandmother   . Diabetes Brother   . Obesity Brother     Social History Social History  Substance Use Topics  . Smoking status: Never Smoker  . Smokeless tobacco: Current User    Types: Snuff     Comment: dips snuff for 25 years  . Alcohol use No     Allergies   Patient has no known allergies.   Review of Systems Review of Systems  Constitutional: Negative for appetite change and fatigue.  HENT: Negative for congestion, ear discharge and sinus pressure.   Eyes: Negative for discharge.  Respiratory: Negative for cough.   Cardiovascular: Positive for chest pain.  Gastrointestinal: Negative for abdominal pain and diarrhea.  Genitourinary: Negative for frequency and hematuria.  Musculoskeletal: Negative for back pain.  Skin: Negative for rash.  Allergic/Immunologic: Negative for food allergies.  Neurological: Negative for seizures and headaches.  Psychiatric/Behavioral: Negative for hallucinations.  Physical Exam Updated Vital Signs BP 112/70   Pulse (!) 53   Temp 98.7 F (37.1 C) (Oral)   Resp 11   Ht 5\' 10"  (1.778 m)   Wt 106.6 kg (235 lb)   SpO2 98%   BMI 33.72 kg/m   Physical Exam  Constitutional: He is oriented to person, place, and time. He appears well-developed.  HENT:  Head: Normocephalic.  Eyes: Conjunctivae and EOM are normal. No scleral icterus.  Neck: Neck supple. No thyromegaly present.  Cardiovascular: Normal rate and regular rhythm.  Exam reveals no gallop and no friction rub.   No murmur heard. Pulmonary/Chest: No stridor. He has no wheezes. He has no rales. He exhibits no tenderness.   Abdominal: He exhibits no distension. There is no tenderness. There is no rebound.  Musculoskeletal: Normal range of motion. He exhibits no edema.  Lymphadenopathy:    He has no cervical adenopathy.  Neurological: He is oriented to person, place, and time. He exhibits normal muscle tone. Coordination normal.  Skin: No rash noted. No erythema.  Psychiatric: He has a normal mood and affect. His behavior is normal.     ED Treatments / Results  Labs (all labs ordered are listed, but only abnormal results are displayed) Labs Reviewed  BASIC METABOLIC PANEL - Abnormal; Notable for the following:       Result Value   Potassium 3.3 (*)    CO2 21 (*)    All other components within normal limits  HEPATIC FUNCTION PANEL - Abnormal; Notable for the following:    Indirect Bilirubin 1.0 (*)    All other components within normal limits  CK - Abnormal; Notable for the following:    Total CK 409 (*)    All other components within normal limits  CBC  TROPONIN I  I-STAT TROPONIN, ED    EKG  EKG Interpretation None       Radiology Dg Chest 2 View  Result Date: 08/26/2017 CLINICAL DATA:  He was working outside and his shirt became soaking wet, dizziness and chest pain today. EXAM: CHEST  2 VIEW COMPARISON:  05/23/2017 FINDINGS: Normal mediastinum and cardiac silhouette. Normal pulmonary vasculature. No evidence of effusion, infiltrate, or pneumothorax. No acute bony abnormality. IMPRESSION: No acute cardiopulmonary process. Electronically Signed   By: Suzy Bouchard M.D.   On: 08/26/2017 15:08    Procedures Procedures (including critical care time)  Medications Ordered in ED Medications  potassium chloride SA (K-DUR,KLOR-CON) 20 MEQ CR tablet (not administered)  sodium chloride 0.9 % bolus 1,000 mL (0 mLs Intravenous Stopped 08/26/17 1745)  potassium chloride SA (K-DUR,KLOR-CON) CR tablet 40 mEq (40 mEq Oral Given 08/26/17 1622)     Initial Impression / Assessment and Plan / ED  Course  I have reviewed the triage vital signs and the nursing notes.  Pertinent labs & imaging results that were available during my care of the patient were reviewed by me and considered in my medical decision making (see chart for details).     Labs including 2 troponins were normal. EKG unremarkable. Doubt symptoms are related to coronary artery disease. Possible mild heat exhaustion. He will follow-up with PCP next week  Final Clinical Impressions(s) / ED Diagnoses   Final diagnoses:  None    New Prescriptions New Prescriptions   No medications on file     Milton Ferguson, MD 08/26/17 [phone]

## 2017-08-26 NOTE — Discharge Instructions (Signed)
Drink plenty of fluids.  Follow-up with your family doctor next week for recheck 

## 2017-08-26 NOTE — ED Triage Notes (Signed)
Cutting brush in the yard and pulling it when he experienced CP Now likebns it to a pulled muscle

## 2017-09-20 ENCOUNTER — Emergency Department (HOSPITAL_COMMUNITY): Payer: Medicare Other

## 2017-09-20 ENCOUNTER — Encounter (HOSPITAL_COMMUNITY): Payer: Self-pay | Admitting: *Deleted

## 2017-09-20 ENCOUNTER — Emergency Department (HOSPITAL_COMMUNITY)
Admission: EM | Admit: 2017-09-20 | Discharge: 2017-09-21 | Disposition: A | Discharge status: Home / Self Care | Payer: Medicare Other | Attending: Emergency Medicine | Admitting: Emergency Medicine

## 2017-09-20 DIAGNOSIS — R079 Chest pain, unspecified: Secondary | ICD-10-CM | POA: Diagnosis present

## 2017-09-20 DIAGNOSIS — Z7982 Long term (current) use of aspirin: Secondary | ICD-10-CM | POA: Insufficient documentation

## 2017-09-20 DIAGNOSIS — I10 Essential (primary) hypertension: Secondary | ICD-10-CM | POA: Insufficient documentation

## 2017-09-20 DIAGNOSIS — R0789 Other chest pain: Secondary | ICD-10-CM | POA: Insufficient documentation

## 2017-09-20 DIAGNOSIS — F17290 Nicotine dependence, other tobacco product, uncomplicated: Secondary | ICD-10-CM | POA: Insufficient documentation

## 2017-09-20 DIAGNOSIS — K219 Gastro-esophageal reflux disease without esophagitis: Secondary | ICD-10-CM | POA: Insufficient documentation

## 2017-09-20 DIAGNOSIS — Z79899 Other long term (current) drug therapy: Secondary | ICD-10-CM | POA: Insufficient documentation

## 2017-09-20 NOTE — ED Triage Notes (Signed)
Pt reports chest pains to the left sternum. EMS gave 324 ASA & 1 nitro,

## 2017-09-21 DIAGNOSIS — R079 Chest pain, unspecified: Secondary | ICD-10-CM | POA: Diagnosis not present

## 2017-09-21 LAB — BASIC METABOLIC PANEL
ANION GAP: 10 (ref 5–15)
BUN: 9 mg/dL (ref 6–20)
CALCIUM: 9.5 mg/dL (ref 8.9–10.3)
CHLORIDE: 105 mmol/L (ref 101–111)
CO2: 27 mmol/L (ref 22–32)
Creatinine, Ser: 0.96 mg/dL (ref 0.61–1.24)
GFR calc non Af Amer: >60 mL/min (ref >60)
Glucose, Bld: 98 mg/dL (ref 65–99)
POTASSIUM: 4.1 mmol/L (ref 3.5–5.1)
Sodium: 142 mmol/L (ref 135–145)

## 2017-09-21 LAB — CBC
HCT: 42.6 % (ref 39.0–52.0)
HEMOGLOBIN: 14.7 g/dL (ref 13.0–17.0)
MCH: 29.4 pg (ref 26.0–34.0)
MCHC: 34.5 g/dL (ref 30.0–36.0)
MCV: 85.2 fL (ref 78.0–100.0)
Platelets: 213 10*3/uL (ref 150–400)
RBC: 5 MIL/uL (ref 4.22–5.81)
RDW: 12.9 % (ref 11.5–15.5)
WBC: 8 10*3/uL (ref 4.0–10.5)

## 2017-09-21 LAB — D-DIMER, QUANTITATIVE: D-Dimer, Quant: <0.27 ug/mL-FEU (ref 0.00–0.50)

## 2017-09-21 LAB — I-STAT TROPONIN, ED: TROPONIN I, POC: 0 ng/mL (ref 0.00–0.08)

## 2017-09-21 MED ORDER — GI COCKTAIL ~~LOC~~
30.0000 mL | Freq: Once | ORAL | Status: AC
  Administered 2017-09-21: 30 mL via ORAL
  Filled 2017-09-21: qty 30

## 2017-09-21 MED ORDER — FAMOTIDINE 20 MG PO TABS
20.0000 mg | ORAL_TABLET | Freq: Once | ORAL | Status: AC
  Administered 2017-09-21: 20 mg via ORAL
  Filled 2017-09-21: qty 1

## 2017-09-21 MED ORDER — OMEPRAZOLE 20 MG PO CPDR: DELAYED_RELEASE_CAPSULE | ORAL | 0 refills | Status: DC

## 2017-09-21 NOTE — ED Provider Notes (Signed)
[facility] EMERGENCY DEPARTMENT Provider Note   CSN: [id number] Arrival date & time: 09/20/17  2350  Time seen 23:58 PM   History   Chief Complaint Chief Complaint  Patient presents with  . Chest Pain    HPI Ronald Conner is a 56 y.o. male.  HPI patient states yesterday afternoon, October 29 several hours after removing several pieces of firewood he started having some left lower chest discomfort that has been there constantly until tonight.  He states there is a constant "1 out of 10" mildly stabbing discomfort that gets worse if he takes a big deep breath.  He states he feels like he cannot swallow right because his mouth is dry.  He denies shortness of breath or diaphoresis.  He had some nausea last night and he also had some nausea tonight without vomiting.  He states he has been belching and when he belches he gets a burning fluid in his throat.  If this makes him feel very nauseated.  He states during the day he had a bowel movement x4 and then his pain would feel better.  He also states he takes Tums which helps with the burning symptoms and he noticed that it is especially worse after eating spicy foods which he likes.  He states EMS gave him 4 baby aspirin and nitroglycerin sublingually which helped his discomfort.  Patient was seen for the same on October 5 after cutting brush in the yard and had a negative delta troponins and was to follow-up with his primary care doctor.  He states his mother has a irregular heartbeat but never had a MI, his father died of MI, he has a brother with diabetes and another brother on dialysis.  PCP Redmond School, MD   Past Medical History:  Diagnosis Date  . Hypertension     There are no active problems to display for this patient.   Past Surgical History:  Procedure Laterality Date  . NO PAST SURGERIES         Home Medications    Prior to Admission medications   Medication Sig Start Date End Date Taking? Authorizing Provider    aspirin EC 81 MG tablet Take 324 mg by mouth once.   Yes [provider]  HYDROcodone-acetaminophen (NORCO/VICODIN) 5-325 MG tablet Take 1-2 tablets by mouth every 12 (twelve) hours as needed for moderate pain.    Yes [provider]  lisinopril (PRINIVIL,ZESTRIL) 20 MG tablet Take 1 tablet (20 mg total) by mouth daily. 05/06/17 09/20/17 Yes BranchAlphonse Guild, MD  omeprazole (PRILOSEC) 20 MG capsule Take 1 po BID x 2 weeks then once a day 09/21/17   Ronald Porter, MD    Family History Family History  Problem Relation Age of Onset  . Arrhythmia Mother        h/o DCCV  . Leukemia Mother   . Hypertension Mother   . Cataracts Mother   . Peripheral Artery Disease Mother   . Heart attack Father 21  . Kidney failure Brother        on dialysis  . Hypertension Brother   . Arrhythmia Maternal Grandmother   . Heart attack Maternal Grandmother   . Stroke Maternal Grandfather   . Peripheral Artery Disease Maternal Grandfather        double amputee  . Dementia Paternal Grandmother   . Diabetes Brother   . Obesity Brother     Social History Social History  Substance Use Topics  . Smoking status: Never  Smoker  . Smokeless tobacco: Current User    Types: Snuff     Comment: dips snuff for 25 years  . 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   Review of Systems Review of Systems  All other systems reviewed and are negative.    Physical Exam Updated Vital Signs BP (!) 138/94   Pulse (!) 48   Temp 98.6 F (37 C) (Oral)   Resp 10   Ht 5\' 10"  (1.778 m)   Wt 106.6 kg (235 lb)   SpO2 98%   BMI 33.72 kg/m   Vital signs normal except bradycardia, patient states his heart rate is always low   Physical Exam  Constitutional: He is oriented to person, place, and time. He appears well-developed and well-nourished.  Non-toxic appearance. He does not appear ill. No  distress.  HENT:  Head: Normocephalic and atraumatic.  Right Ear: External ear normal.  Left Ear: External ear normal.  Nose: Nose normal. No mucosal edema or rhinorrhea.  Mouth/Throat: Oropharynx is clear and moist and mucous membranes are normal. No dental abscesses or uvula swelling.  Eyes: Pupils are equal, round, and reactive to light. Conjunctivae and EOM are normal.  Neck: Normal range of motion and full passive range of motion without pain. Neck supple.  Cardiovascular: Regular rhythm and normal heart sounds.  Bradycardia present.  Exam reveals no gallop and no friction rub.   No murmur heard. Pulmonary/Chest: Effort normal and breath sounds normal. No respiratory distress. He has no wheezes. He has no rhonchi. He has no rales. He exhibits no tenderness and no crepitus.    Area of chest pain noted, nontender to palpation  Abdominal: Soft. Normal appearance and bowel sounds are normal. He exhibits no distension. There is no tenderness. There is no rebound and no guarding.  Musculoskeletal: Normal range of motion. He exhibits no edema or tenderness.  Moves all extremities well.   Neurological: He is alert and oriented to person, place, and time. He has normal strength. No cranial nerve deficit.  Skin: Skin is warm, dry and intact. No rash noted. No erythema. No pallor.  Psychiatric: He has a normal mood and affect. His speech is normal and behavior is normal. His mood appears not anxious.  Nursing note and vitals reviewed.    ED Treatments / Results  Labs (all labs ordered are listed, but only abnormal results are displayed) Results for orders placed or performed during the hospital encounter of 95/63/87  Basic metabolic panel  Result Value Ref Range   Sodium 142 135 - 145 mmol/L   Potassium 4.1 3.5 - 5.1 mmol/L   Chloride 105 101 - 111 mmol/L   CO2 27 22 - 32 mmol/L   Glucose, Bld 98 65 - 99 mg/dL   BUN 9 6 - 20 mg/dL   Creatinine, Ser 0.96 0.61 - 1.24 mg/dL   Calcium 9.5  8.9 - 10.3 mg/dL   GFR calc non Af Amer >60 >60 mL/min   GFR calc Af Amer >60 >60 mL/min   Anion gap 10 5 - 15  CBC  Result Value Ref Range   WBC 8.0 4.0 - 10.5 K/uL   RBC 5.00 4.22 - 5.81 MIL/uL   Hemoglobin 14.7 13.0 - 17.0 g/dL   HCT 42.6 39.0 - 52.0 %   MCV 85.2 78.0 - 100.0 fL   MCH 29.4 26.0 - 34.0 pg  MCHC 34.5 30.0 - 36.0 g/dL   RDW 12.9 11.5 - 15.5 %   Platelets 213 150 - 400 K/uL  D-dimer, quantitative  Result Value Ref Range   D-Dimer, Quant <0.27 0.00 - 0.50 ug/mL-FEU  I-stat troponin, ED  Result Value Ref Range   Troponin i, poc 0.00 0.00 - 0.08 ng/mL   Comment 3           Laboratory interpretation all normal     EKG  EKG Interpretation  Date/Time:  Tuesday September 20 2017 23:51:39 EDT Ventricular Rate:  49 PR Interval:    QRS Duration: 107 QT Interval:  434 QTC Calculation: 392 R Axis:   37 Text Interpretation:  Sinus bradycardia Otherwise within normal limits Since last tracing rate slower 26 Aug 2017 Confirmed by Ronald Porter [phone]) on 09/20/2017 11:59:04 PM       Radiology Dg Chest 2 View  Result Date: 09/21/2017 CLINICAL DATA:  56 year old male with chest pain and difficulty swallowing. EXAM: CHEST  2 VIEW COMPARISON:  Chest radiograph dated 08/26/2017 FINDINGS: The lungs are clear. There is no pleural effusion or pneumothorax. The cardiac silhouette is within normal limits. No acute osseous pathology. IMPRESSION: No active cardiopulmonary disease. Electronically Signed   By: Anner Crete M.D.   On: 09/21/2017 00:19    Procedures Procedures (including critical care time)  Medications Ordered in ED Medications  gi cocktail (Maalox,Lidocaine,Donnatal) (30 mLs Oral Given 09/21/17 0043)  famotidine (PEPCID) tablet 20 mg (20 mg Oral Given 09/21/17 0043)     Initial Impression / Assessment and Plan / ED Course  I have reviewed the triage vital signs and the nursing notes.  Pertinent labs & imaging results that were available during my care  of the patient were reviewed by me and considered in my medical decision making (see chart for details).    Patient states he has had continuous chest pain since yesterday.  One troponin was done.  Also screen for PE was done which was negative.  Patient was given a GI cocktail and Pepcid which greatly improved his symptoms.  We discussed starting a acid reducer such as Prilosec for his GERD symptoms.  He was discharged home to follow-up with his PCP and to start Prilosec.   Final Clinical Impressions(s) / ED Diagnoses   Final diagnoses:  Atypical chest pain  Gastroesophageal reflux disease without esophagitis    New Prescriptions New Prescriptions   OMEPRAZOLE (PRILOSEC) 20 MG CAPSULE    Take 1 po BID x 2 weeks then once a day    Plan discharge  Ronald Porter, MD, Barbette Or, MD 09/21/17 [phone]

## 2017-09-21 NOTE — Discharge Instructions (Signed)
Start the prilosec as prescribed. It is also OTC if you run out. Look at the diet information for acid reflux disease. Follow up with Dr Gerarda Fraction.

## 2017-09-23 ENCOUNTER — Encounter (HOSPITAL_COMMUNITY): Payer: Self-pay

## 2017-09-23 ENCOUNTER — Emergency Department (HOSPITAL_COMMUNITY)
Admission: EM | Admit: 2017-09-23 | Discharge: 2017-09-23 | Disposition: A | Discharge status: Home / Self Care | Payer: Medicare Other | Attending: Emergency Medicine | Admitting: Emergency Medicine

## 2017-09-23 DIAGNOSIS — Z7982 Long term (current) use of aspirin: Secondary | ICD-10-CM | POA: Diagnosis not present

## 2017-09-23 DIAGNOSIS — R0602 Shortness of breath: Secondary | ICD-10-CM | POA: Diagnosis not present

## 2017-09-23 DIAGNOSIS — R072 Precordial pain: Secondary | ICD-10-CM | POA: Diagnosis not present

## 2017-09-23 DIAGNOSIS — R42 Dizziness and giddiness: Secondary | ICD-10-CM | POA: Diagnosis not present

## 2017-09-23 DIAGNOSIS — I10 Essential (primary) hypertension: Secondary | ICD-10-CM | POA: Insufficient documentation

## 2017-09-23 DIAGNOSIS — Z79899 Other long term (current) drug therapy: Secondary | ICD-10-CM | POA: Diagnosis not present

## 2017-09-23 DIAGNOSIS — R079 Chest pain, unspecified: Secondary | ICD-10-CM | POA: Diagnosis not present

## 2017-09-23 LAB — CBC
HEMATOCRIT: 42.8 % (ref 39.0–52.0)
Hemoglobin: 14.4 g/dL (ref 13.0–17.0)
MCH: 29.2 pg (ref 26.0–34.0)
MCHC: 33.6 g/dL (ref 30.0–36.0)
MCV: 86.8 fL (ref 78.0–100.0)
PLATELETS: 212 10*3/uL (ref 150–400)
RBC: 4.93 MIL/uL (ref 4.22–5.81)
RDW: 13.1 % (ref 11.5–15.5)
WBC: 8.3 10*3/uL (ref 4.0–10.5)

## 2017-09-23 LAB — COMPREHENSIVE METABOLIC PANEL
ALT: 22 U/L (ref 17–63)
AST: 23 U/L (ref 15–41)
Albumin: 4.1 g/dL (ref 3.5–5.0)
Alkaline Phosphatase: 67 U/L (ref 38–126)
Anion gap: 9 (ref 5–15)
BUN: 9 mg/dL (ref 6–20)
CHLORIDE: 106 mmol/L (ref 101–111)
CO2: 26 mmol/L (ref 22–32)
CREATININE: 0.87 mg/dL (ref 0.61–1.24)
Calcium: 9.2 mg/dL (ref 8.9–10.3)
GFR calc Af Amer: >60 mL/min (ref >60)
Glucose, Bld: 93 mg/dL (ref 65–99)
Potassium: 3.7 mmol/L (ref 3.5–5.1)
Sodium: 141 mmol/L (ref 135–145)
Total Bilirubin: 0.9 mg/dL (ref 0.3–1.2)
Total Protein: 7.2 g/dL (ref 6.5–8.1)

## 2017-09-23 LAB — LIPASE, BLOOD: LIPASE: 42 U/L (ref 11–51)

## 2017-09-23 LAB — I-STAT TROPONIN, ED
TROPONIN I, POC: 0.01 ng/mL (ref 0.00–0.08)
Troponin i, poc: 0 ng/mL (ref 0.00–0.08)

## 2017-09-23 LAB — D-DIMER, QUANTITATIVE: D-Dimer, Quant: 0.31 ug/mL-FEU (ref 0.00–0.50)

## 2017-09-23 NOTE — Discharge Instructions (Addendum)
Continue taking the Prilosec as previously prescribed.  You may take Zantac or Pepcid as well if needed.  Please schedule follow-up with Dr. Riley Kill for repeat evaluation.

## 2017-09-23 NOTE — ED Provider Notes (Signed)
[facility] EMERGENCY DEPARTMENT Provider Note   CSN: [id number] Arrival date & time: 09/23/17  0040     History   Chief Complaint Chief Complaint  Patient presents with  . Chest Pain    HPI Ronald Conner is a 56 y.o. male.  Patient presents to the ER for evaluation of chest pain.  Patient reports sudden onset of chest pain earlier tonight.  Pain was in the left sternal area and radiated into his back.  He reported shortness of breath.  Patient reports that he called EMS.  By the time he came to the house his symptoms had resolved, so he was not transported.  Since then he has been feeling weak, dizzy, like he might pass out.  No further chest or back pain.  Patient reports nausea, no vomiting.      Past Medical History:  Diagnosis Date  . Hypertension     There are no active problems to display for this patient.   Past Surgical History:  Procedure Laterality Date  . NO PAST SURGERIES         Home Medications    Prior to Admission medications   Medication Sig Start Date End Date Taking? Authorizing Provider  aspirin EC 81 MG tablet Take 324 mg by mouth once.    [provider]  HYDROcodone-acetaminophen (NORCO/VICODIN) 5-325 MG tablet Take 1-2 tablets by mouth every 12 (twelve) hours as needed for moderate pain.     [provider]  lisinopril (PRINIVIL,ZESTRIL) 20 MG tablet Take 1 tablet (20 mg total) by mouth daily. 05/06/17 09/20/17  Arnoldo Lenis, MD  omeprazole (PRILOSEC) 20 MG capsule Take 1 po BID x 2 weeks then once a day 09/21/17   Rolland Porter, MD    Family History Family History  Problem Relation Age of Onset  . Arrhythmia Mother        h/o DCCV  . Leukemia Mother   . Hypertension Mother   . Cataracts Mother   . Peripheral Artery Disease Mother   . Heart attack Father 33  . Kidney failure Brother        on dialysis  . Hypertension Brother   . Arrhythmia Maternal Grandmother   . Heart attack Maternal Grandmother   .  Stroke Maternal Grandfather   . Peripheral Artery Disease Maternal Grandfather        double amputee  . Dementia Paternal Grandmother   . Diabetes Brother   . Obesity Brother     Social History Social History  Substance Use Topics  . Smoking status: Never Smoker  . Smokeless tobacco: Current User    Types: Snuff     Comment: dips snuff for 25 years  . Alcohol use No     Allergies   Patient has no known allergies.   Review of Systems Review of Systems  Respiratory: Positive for shortness of breath.   Cardiovascular: Positive for chest pain.  Neurological: Positive for dizziness.  All other systems reviewed and are negative.    Physical Exam Updated Vital Signs BP 110/78 (BP Location: Left Arm)   Pulse (!) 45   Temp 98.2 F (36.8 C) (Oral)   Resp 18   Ht 5\' 9"  (1.753 m)   Wt 106.6 kg (235 lb)   SpO2 100%   BMI 34.70 kg/m   Physical Exam  Constitutional: He is oriented to person, place, and time. He appears well-developed and well-nourished. No distress.  HENT:  Head: Normocephalic and atraumatic.  Right Ear:  Hearing normal.  Left Ear: Hearing normal.  Nose: Nose normal.  Mouth/Throat: Oropharynx is clear and moist and mucous membranes are normal.  Eyes: Pupils are equal, round, and reactive to light. Conjunctivae and EOM are normal.  Neck: Normal range of motion. Neck supple.  Cardiovascular: Regular rhythm, S1 normal and S2 normal.  Exam reveals no gallop and no friction rub.   No murmur heard. Pulmonary/Chest: Effort normal and breath sounds normal. No respiratory distress. He exhibits no tenderness.  Abdominal: Soft. Normal appearance and bowel sounds are normal. There is no hepatosplenomegaly. There is no tenderness. There is no rebound, no guarding, no tenderness at McBurney's point and negative Murphy's sign. No hernia.  Musculoskeletal: Normal range of motion.  Neurological: He is alert and oriented to person, place, and time. He has normal strength.  No cranial nerve deficit or sensory deficit. Coordination normal. GCS eye subscore is 4. GCS verbal subscore is 5. GCS motor subscore is 6.  Skin: Skin is warm, dry and intact. No rash noted. No cyanosis.  Psychiatric: He has a normal mood and affect. His speech is normal and behavior is normal. Thought content normal.  Nursing note and vitals reviewed.    ED Treatments / Results  Labs (all labs ordered are listed, but only abnormal results are displayed) Labs Reviewed  CBC  COMPREHENSIVE METABOLIC PANEL  LIPASE, BLOOD  D-DIMER, QUANTITATIVE (NOT AT [facility])  I-STAT TROPONIN, ED  I-STAT TROPONIN, ED    EKG  EKG Interpretation  Date/Time:  Friday September 23 2017 00:52:27 EDT Ventricular Rate:  47 PR Interval:    QRS Duration: 114 QT Interval:  429 QTC Calculation: 380 R Axis:   24 Text Interpretation:  Sinus bradycardia Otherwise within normal limits No significant change since last tracing Confirmed by Orpah Greek [phone]) on 09/23/2017 1:20:30 AM       Radiology No results found.  Procedures Procedures (including critical care time)  Medications Ordered in ED Medications - No data to display   Initial Impression / Assessment and Plan / ED Course  I have reviewed the triage vital signs and the nursing notes.  Pertinent labs & imaging results that were available during my care of the patient were reviewed by me and considered in my medical decision making (see chart for details).     Patient presents to the ER for evaluation of multiple complaints.  Patient initially had chest pain that resolved spontaneously.  He has been seen in the ER 2 other times in the last month with similar complaints.  Patient reports resolution of the chest pain, then onset of weakness and dizziness.  He has had multiple visits to the ER for this as well.  Patient did follow-up with cardiology in July of this year for his bradycardia, generalized weakness, dizziness.  This was felt  to be multifactorial including possible vasovagal symptoms.  All in all, no new symptoms present today and his workup has been negative.  Patient reassured, will follow up with his primary care doctor for repeat evaluation.  Final Clinical Impressions(s) / ED Diagnoses   Final diagnoses:  Chest pain, unspecified type    New Prescriptions Discharge Medication List as of 09/23/2017  4:18 AM       Orpah Greek, MD 09/23/17 [phone]

## 2017-09-23 NOTE — ED Triage Notes (Signed)
Pt states he was sitting at home when he had sudden onset of chest pain that sometimes goes through to his back.  Pt states ems came to the house but then he started feeling better and did not come at that time.  Pain and discomfort worsened since then

## 2017-09-27 ENCOUNTER — Other Ambulatory Visit: Payer: Self-pay

## 2017-09-27 NOTE — Patient Outreach (Signed)
[place] [facility]) Care Management  09/27/2017  Ronald Conner 05-12-1961 [id number]   1st Telephone call to patient for ED Utilization screening.  No answer.  HIPAA complaint voice message left.     Plan:  RN Health Coach will make outreach attempt to patient within three business days.   Lazaro Arms RN, BSN, [place] Direct Dial:  [phone] Fax: [phone]

## 2017-09-28 ENCOUNTER — Emergency Department (HOSPITAL_COMMUNITY): Payer: Medicare Other

## 2017-09-28 ENCOUNTER — Encounter (HOSPITAL_COMMUNITY): Payer: Self-pay

## 2017-09-28 ENCOUNTER — Other Ambulatory Visit: Payer: Self-pay

## 2017-09-28 ENCOUNTER — Emergency Department (HOSPITAL_COMMUNITY)
Admission: EM | Admit: 2017-09-28 | Discharge: 2017-09-28 | Disposition: A | Discharge status: Home / Self Care | Payer: Medicare Other | Attending: Emergency Medicine | Admitting: Emergency Medicine

## 2017-09-28 DIAGNOSIS — Z79899 Other long term (current) drug therapy: Secondary | ICD-10-CM | POA: Insufficient documentation

## 2017-09-28 DIAGNOSIS — R079 Chest pain, unspecified: Secondary | ICD-10-CM | POA: Diagnosis not present

## 2017-09-28 DIAGNOSIS — K219 Gastro-esophageal reflux disease without esophagitis: Secondary | ICD-10-CM | POA: Diagnosis not present

## 2017-09-28 DIAGNOSIS — Z7982 Long term (current) use of aspirin: Secondary | ICD-10-CM | POA: Diagnosis not present

## 2017-09-28 DIAGNOSIS — R072 Precordial pain: Secondary | ICD-10-CM | POA: Insufficient documentation

## 2017-09-28 DIAGNOSIS — I10 Essential (primary) hypertension: Secondary | ICD-10-CM | POA: Diagnosis not present

## 2017-09-28 HISTORY — DX: Gastro-esophageal reflux disease without esophagitis: K21.9

## 2017-09-28 LAB — TROPONIN I

## 2017-09-28 LAB — CBC WITH DIFFERENTIAL/PLATELET
Basophils Absolute: 0 10*3/uL (ref 0.0–0.1)
Basophils Relative: 0 %
EOS ABS: 0.2 10*3/uL (ref 0.0–0.7)
EOS PCT: 3 %
HCT: 43.2 % (ref 39.0–52.0)
HEMOGLOBIN: 14.9 g/dL (ref 13.0–17.0)
LYMPHS ABS: 2.9 10*3/uL (ref 0.7–4.0)
Lymphocytes Relative: 36 %
MCH: 30 pg (ref 26.0–34.0)
MCHC: 34.5 g/dL (ref 30.0–36.0)
MCV: 86.9 fL (ref 78.0–100.0)
MONOS PCT: 7 %
Monocytes Absolute: 0.6 10*3/uL (ref 0.1–1.0)
Neutro Abs: 4.4 10*3/uL (ref 1.7–7.7)
Neutrophils Relative %: 54 %
PLATELETS: 206 10*3/uL (ref 150–400)
RBC: 4.97 MIL/uL (ref 4.22–5.81)
RDW: 13 % (ref 11.5–15.5)
WBC: 8.2 10*3/uL (ref 4.0–10.5)

## 2017-09-28 LAB — COMPREHENSIVE METABOLIC PANEL
ALBUMIN: 4 g/dL (ref 3.5–5.0)
ALK PHOS: 64 U/L (ref 38–126)
ALT: 24 U/L (ref 17–63)
AST: 22 U/L (ref 15–41)
Anion gap: 8 (ref 5–15)
BUN: 9 mg/dL (ref 6–20)
CALCIUM: 8.8 mg/dL — AB (ref 8.9–10.3)
CO2: 27 mmol/L (ref 22–32)
CREATININE: 0.87 mg/dL (ref 0.61–1.24)
Chloride: 103 mmol/L (ref 101–111)
GFR calc Af Amer: >60 mL/min (ref >60)
GFR calc non Af Amer: >60 mL/min (ref >60)
GLUCOSE: 105 mg/dL — AB (ref 65–99)
Potassium: 3.7 mmol/L (ref 3.5–5.1)
SODIUM: 138 mmol/L (ref 135–145)
Total Bilirubin: 0.8 mg/dL (ref 0.3–1.2)
Total Protein: 7.2 g/dL (ref 6.5–8.1)

## 2017-09-28 LAB — LIPASE, BLOOD: Lipase: 38 U/L (ref 11–51)

## 2017-09-28 MED ORDER — ONDANSETRON HCL 4 MG/2ML IJ SOLN
4.0000 mg | Freq: Once | INTRAMUSCULAR | Status: AC
  Administered 2017-09-28: 4 mg via INTRAVENOUS
  Filled 2017-09-28: qty 2

## 2017-09-28 MED ORDER — SODIUM CHLORIDE 0.9 % IV BOLUS (SEPSIS)
500.0000 mL | Freq: Once | INTRAVENOUS | Status: AC
  Administered 2017-09-28: 500 mL via INTRAVENOUS

## 2017-09-28 MED ORDER — PANTOPRAZOLE SODIUM 40 MG IV SOLR
40.0000 mg | Freq: Once | INTRAVENOUS | Status: AC
  Administered 2017-09-28: 40 mg via INTRAVENOUS
  Filled 2017-09-28: qty 40

## 2017-09-28 MED ORDER — SODIUM CHLORIDE 0.9 % IV SOLN
INTRAVENOUS | Status: DC
  Administered 2017-09-28: 09:00:00 via INTRAVENOUS

## 2017-09-28 NOTE — ED Notes (Signed)
Chest pain started about 12 MN.  Part to mid chest and pain goes though to back. Rates pain 3/10.  C/o muscles soreness to chest 5/10.  C/o tenderness to left chest.

## 2017-09-28 NOTE — ED Provider Notes (Signed)
[facility] EMERGENCY DEPARTMENT Provider Note   CSN: [id number] Arrival date & time: 09/28/17  7628     History   Chief Complaint Chief Complaint  Patient presents with  . Chest Pain    HPI Ronald Conner is a 56 y.o. male.  PATIENT presents with chest pain that started at midnight.  Is been constant across the anterior part of the chest radiates to the back behind and between the shoulder blades.  No nausea or vomiting no shortness of breath.  Is associated with lots of belching.  Patient does have a history of reflux disease.  Patient currently on Prilosec.  Patient states this is worse than normal.  But it does remind him of his reflux disease.      Past Medical History:  Diagnosis Date  . Acid reflux   . Hypertension     There are no active problems to display for this patient.   Past Surgical History:  Procedure Laterality Date  . NO PAST SURGERIES         Home Medications    Prior to Admission medications   Medication Sig Start Date End Date Taking? Authorizing Provider  aspirin EC 81 MG tablet Take 324 mg by mouth once.   Yes [provider]  HYDROcodone-acetaminophen (NORCO/VICODIN) 5-325 MG tablet Take 1-2 tablets by mouth every 12 (twelve) hours as needed for moderate pain.    Yes [provider]  lisinopril (PRINIVIL,ZESTRIL) 20 MG tablet Take 1 tablet (20 mg total) by mouth daily. 05/06/17 09/28/17 Yes BranchAlphonse Guild, MD  Multiple Vitamin (MULTIVITAMIN) tablet Take 1 tablet daily by mouth.   Yes [provider]  omeprazole (PRILOSEC) 20 MG capsule Take 1 po BID x 2 weeks then once a day 09/21/17  Yes Rolland Porter, MD    Family History Family History  Problem Relation Age of Onset  . Arrhythmia Mother        h/o DCCV  . Leukemia Mother   . Hypertension Mother   . Cataracts Mother   . Peripheral Artery Disease Mother   . Heart attack Father 49  . Kidney failure Brother        on dialysis  . Hypertension Brother     . Arrhythmia Maternal Grandmother   . Heart attack Maternal Grandmother   . Stroke Maternal Grandfather   . Peripheral Artery Disease Maternal Grandfather        double amputee  . Dementia Paternal Grandmother   . Diabetes Brother   . Obesity Brother     Social History Social History   Tobacco Use  . Smoking status: Never Smoker  . Smokeless tobacco: Current User    Types: Snuff  . Tobacco comment: dips snuff for 25 years  Substance Use Topics  . Alcohol use: No  . Drug use: No     Allergies   Patient has no known allergies.   Review of Systems Review of Systems  Constitutional: Negative for fever.  HENT: Negative for congestion.   Eyes: Negative for redness.  Respiratory: Negative for shortness of breath.   Cardiovascular: Positive for chest pain.  Gastrointestinal: Negative for abdominal pain, nausea and vomiting.  Genitourinary: Negative for dysuria.  Musculoskeletal: Positive for back pain.  Skin: Negative for rash.  Neurological: Negative for headaches.  Hematological: Does not bruise/bleed easily.  Psychiatric/Behavioral: Negative for confusion.     Physical Exam Updated Vital Signs BP (!) 134/93   Pulse (!) 41   Temp 97.7 F (36.5  C) (Oral)   Resp 17   Ht 1.753 m (5\' 9" )   Wt 106.6 kg (235 lb)   SpO2 97%   BMI 34.70 kg/m   Physical Exam  Constitutional: He is oriented to person, place, and time. He appears well-developed and well-nourished. He does not appear ill.  HENT:  Head: Normocephalic and atraumatic.  Eyes: Pupils are equal, round, and reactive to light.  Neck: Normal range of motion.  Cardiovascular: Normal rate and regular rhythm.  No murmur heard. Pulses:      Radial pulses are 2+ on the right side, and 2+ on the left side.  Pulmonary/Chest: Effort normal and breath sounds normal. No respiratory distress. He has no decreased breath sounds.  Abdominal: Soft. Bowel sounds are normal. There is no tenderness.  Musculoskeletal:        Right lower leg: Normal.       Left lower leg: Normal.  Neurological: He is alert and oriented to person, place, and time. No cranial nerve deficit.  Skin: Skin is warm and dry.  Nursing note and vitals reviewed.    ED Treatments / Results  Labs (all labs ordered are listed, but only abnormal results are displayed) Labs Reviewed  COMPREHENSIVE METABOLIC PANEL - Abnormal; Notable for the following components:      Result Value   Glucose, Bld 105 (*)    Calcium 8.8 (*)    All other components within normal limits  LIPASE, BLOOD  CBC WITH DIFFERENTIAL/PLATELET  TROPONIN I    EKG  EKG Interpretation None      ED ECG REPORT   Date: 09/28/2017  Rate: 45  Rhythm: sinus bradycardia  QRS Axis: normal  Intervals: normal  ST/T Wave abnormalities: normal  Conduction Disutrbances:nonspecific intraventricular conduction delay  Narrative Interpretation:   Old EKG Reviewed: none available  I have personally reviewed the EKG tracing and agree with the computerized printout as noted.     Radiology Dg Chest 2 View  Result Date: 09/28/2017 CLINICAL DATA:  Chest pain with heartburn and belching. EXAM: CHEST  2 VIEW COMPARISON:  09/20/2017 FINDINGS: The heart size and mediastinal contours are within normal limits. Both lungs are clear. The visualized skeletal structures are unremarkable. IMPRESSION: No active cardiopulmonary disease. Electronically Signed   By: Kathreen Devoid   On: 09/28/2017 09:21    Procedures Procedures (including critical care time)  Medications Ordered in ED Medications  0.9 %  sodium chloride infusion ( Intravenous New Bag/Given 09/28/17 0923)  sodium chloride 0.9 % bolus 500 mL (0 mLs Intravenous Stopped 09/28/17 0922)  ondansetron (ZOFRAN) injection 4 mg (4 mg Intravenous Given 09/28/17 0859)  pantoprazole (PROTONIX) injection 40 mg (40 mg Intravenous Given 09/28/17 0900)     Initial Impression / Assessment and Plan / ED Course  I have reviewed the triage  vital signs and the nursing notes.  Pertinent labs & imaging results that were available during my care of the patient were reviewed by me and considered in my medical decision making (see chart for details).    Patient's chest pain has been constant.  Troponin negative.  EKG without acute changes.  Chest x-ray without any acute findings.  Does not seem to be consistent with acute cardiac chest pain.  Patient given Protonix here with significant improvement in his symptoms.  Patient stable for discharge home he has primary care doctor for follow-up.  Also given referral to GI medicine.  Final Clinical Impressions(s) / ED Diagnoses   Final diagnoses:  Precordial  pain  Gastroesophageal reflux disease, esophagitis presence not specified    ED Discharge Orders    None       Fredia Sorrow, MD 09/28/17 1534

## 2017-09-28 NOTE — ED Triage Notes (Signed)
Pt reports was diagnosed recently with acid reflux and started taking omeprazole.  Reports symptoms improved until last night.  Pt says started belching a lot and having chest pain.  Reports pt high and legs feeling numb, tingly, and hot.

## 2017-09-28 NOTE — ED Notes (Signed)
Dr. Zackowski in with pt  

## 2017-09-28 NOTE — Patient Outreach (Addendum)
[place] [facility]) Care Management  09/28/2017  Ronald Conner October 19, 1961 [id number]   2nd Telephone call to patient for ED Utilization screening.  Wife answered the phone patient was asleep.  HIPAA complaint message left.     Plan: RN Health Coach will make outreach attempt to patient within three business days.   Lazaro Arms RN, BSN, [employer] Direct Dial:  [phone] Fax: [phone]

## 2017-09-28 NOTE — Discharge Instructions (Signed)
Workup today for the chest pain without evidence of any acute cardiac findings.  Symptoms seem to be most consistent with reflux disease.  Referral made to GI medicine for additional follow-up.  Continue current medications.

## 2017-09-30 ENCOUNTER — Other Ambulatory Visit: Payer: Self-pay

## 2017-09-30 NOTE — Patient Outreach (Signed)
[facility]) Care Management  09/30/2017  REHMAN LEVINSON 1961-08-06 [id number]   3rd Telephone call to patient for ED Utilization screening.  No answer.  HIPAA complaint voice message left.    Plan: RN Health Coach will send letter to attempt outreach.  If no response within ten business days will proceed with case closure.     Lazaro Arms RN, BSN, [place] Direct Dial:  [phone] Fax: [phone]

## 2017-10-03 ENCOUNTER — Ambulatory Visit (INDEPENDENT_AMBULATORY_CARE_PROVIDER_SITE_OTHER): Payer: Medicare Other | Admitting: Internal Medicine

## 2017-10-03 ENCOUNTER — Encounter (INDEPENDENT_AMBULATORY_CARE_PROVIDER_SITE_OTHER): Payer: Self-pay | Admitting: *Deleted

## 2017-10-03 ENCOUNTER — Encounter (INDEPENDENT_AMBULATORY_CARE_PROVIDER_SITE_OTHER): Payer: Self-pay | Admitting: Internal Medicine

## 2017-10-03 DIAGNOSIS — K219 Gastro-esophageal reflux disease without esophagitis: Secondary | ICD-10-CM | POA: Diagnosis not present

## 2017-10-03 HISTORY — DX: Gastro-esophageal reflux disease without esophagitis: K21.9

## 2017-10-03 MED ORDER — PANTOPRAZOLE SODIUM 40 MG PO TBEC: 40.0000 mg | DELAYED_RELEASE_TABLET | Freq: Every day | ORAL | 3 refills | Status: DC

## 2017-10-03 NOTE — Progress Notes (Signed)
Subjective:    Patient ID: Ronald Conner, male    DOB: 11-28-1960, 56 y.o.   MRN: [id number]  HPI PCP Dr. Gerarda Fraction. Referred by ED for belching.  Recently seen in the ED with chest pain x 4  in November. States all of his symtoms started after eating homemade stew in October.  After eating, he ran a fever, chills, and developed diarrhea.  Symptoms last x 4 days. Chills lasted for about 8 hrs.  Cardiac work up in the ED was normal.  X 2. States he received a GI cocktail in the ED and states it helped.  Chest pain associated with belching. Had anterior chest pain radiating to back between shoulder blades. No nausea of vomiting. Received Protonix in  The ED with significant relief.  Hx of GERD. Takes Prilosec.  He says today he feels better. He says sometimes he get queasy. Wife says he has decreased appetite because of the gas.  Has a BM daily. No melena or BRRB.  No NSAIDs.  Patient has appt with Dr. Harl Bowie (Cardiac) today.  No dysphagia.    CBC    Component Value Date/Time   WBC 8.2 09/28/2017 0816   RBC 4.97 09/28/2017 0816   HGB 14.9 09/28/2017 0816   HCT 43.2 09/28/2017 0816   PLT 206 09/28/2017 0816   MCV 86.9 09/28/2017 0816   MCH 30.0 09/28/2017 0816   MCHC 34.5 09/28/2017 0816   RDW 13.0 09/28/2017 0816   LYMPHSABS 2.9 09/28/2017 0816   MONOABS 0.6 09/28/2017 0816   EOSABS 0.2 09/28/2017 0816   BASOSABS 0.0 09/28/2017 0816   Hepatic Function Latest Ref Rng & Units 09/28/2017 09/23/2017 08/26/2017  Total Protein 6.5 - 8.1 g/dL 7.2 7.2 7.3  Albumin 3.5 - 5.0 g/dL 4.0 4.1 4.3  AST 15 - 41 U/L 22 23 33  ALT 17 - 63 U/L '24 22 24  '$ Alk Phosphatase 38 - 126 U/L 64 67 61  Total Bilirubin 0.3 - 1.2 mg/dL 0.8 0.9 1.2  Bilirubin, Direct 0.1 - 0.5 mg/dL - - 0.2     Review of Systems Past Medical History:  Diagnosis Date  . Acid reflux   . GERD (gastroesophageal reflux disease) 10/03/2017  . Hypertension     Past Surgical History:  Procedure Laterality Date  . NO PAST  SURGERIES      No Known Allergies  Current Outpatient Medications on File Prior to Visit  Medication Sig Dispense Refill  . aspirin EC 81 MG tablet Take 324 mg by mouth once.    Marland Kitchen HYDROcodone-acetaminophen (NORCO/VICODIN) 5-325 MG tablet Take 1-2 tablets by mouth every 12 (twelve) hours as needed for moderate pain.     . Multiple Vitamin (MULTIVITAMIN) tablet Take 1 tablet daily by mouth.    Marland Kitchen omeprazole (PRILOSEC) 20 MG capsule Take 1 po BID x 2 weeks then once a day 60 capsule 0  . lisinopril (PRINIVIL,ZESTRIL) 20 MG tablet Take 1 tablet (20 mg total) by mouth daily. 90 tablet 3   No current facility-administered medications on file prior to visit.         Objective:   Physical Exam  Blood pressure 118/70, pulse 60, temperature 98 F (36.7 C), height '5\' 10"'$  (1.778 m), weight 224 lb 4.8 oz (101.7 kg). Alert and oriented. Skin warm and dry. Oral mucosa is moist.   . Sclera anicteric, conjunctivae is pink. Thyroid not enlarged. No cervical lymphadenopathy. Lungs clear. Heart regular rate and rhythm.  Abdomen is soft. Bowel sounds  are positive. No hepatomegaly. No abdominal masses felt. No tenderness.  No edema to lower extremities.          Assessment & Plan:  GERD. Will get an EGD.  The risks of bleeding, perforation and infection were reviewed with patient.

## 2017-10-03 NOTE — Patient Instructions (Addendum)
EGD. The risks of bleeding, perforation and infection were reviewed with patient. Rx for Protonix sent to his pharmacy.

## 2017-10-07 ENCOUNTER — Ambulatory Visit (HOSPITAL_COMMUNITY)
Admission: RE | Admit: 2017-10-07 | Discharge: 2017-10-07 | Disposition: A | Discharge status: Home / Self Care | Payer: Medicare Other | Source: Ambulatory Visit | Attending: Internal Medicine | Admitting: Internal Medicine

## 2017-10-07 ENCOUNTER — Encounter (HOSPITAL_COMMUNITY)
Admission: RE | Disposition: A | Discharge status: Home / Self Care | Payer: Self-pay | Source: Ambulatory Visit | Attending: Internal Medicine

## 2017-10-07 ENCOUNTER — Encounter (HOSPITAL_COMMUNITY): Payer: Self-pay | Admitting: *Deleted

## 2017-10-07 ENCOUNTER — Other Ambulatory Visit: Payer: Self-pay

## 2017-10-07 DIAGNOSIS — I10 Essential (primary) hypertension: Secondary | ICD-10-CM | POA: Insufficient documentation

## 2017-10-07 DIAGNOSIS — K317 Polyp of stomach and duodenum: Secondary | ICD-10-CM | POA: Insufficient documentation

## 2017-10-07 DIAGNOSIS — F17290 Nicotine dependence, other tobacco product, uncomplicated: Secondary | ICD-10-CM | POA: Insufficient documentation

## 2017-10-07 DIAGNOSIS — R1013 Epigastric pain: Secondary | ICD-10-CM | POA: Diagnosis not present

## 2017-10-07 DIAGNOSIS — R0789 Other chest pain: Secondary | ICD-10-CM | POA: Insufficient documentation

## 2017-10-07 DIAGNOSIS — Z8249 Family history of ischemic heart disease and other diseases of the circulatory system: Secondary | ICD-10-CM | POA: Diagnosis not present

## 2017-10-07 DIAGNOSIS — K219 Gastro-esophageal reflux disease without esophagitis: Secondary | ICD-10-CM | POA: Diagnosis not present

## 2017-10-07 DIAGNOSIS — Z79899 Other long term (current) drug therapy: Secondary | ICD-10-CM | POA: Diagnosis not present

## 2017-10-07 DIAGNOSIS — K449 Diaphragmatic hernia without obstruction or gangrene: Secondary | ICD-10-CM

## 2017-10-07 DIAGNOSIS — Z7982 Long term (current) use of aspirin: Secondary | ICD-10-CM | POA: Diagnosis not present

## 2017-10-07 DIAGNOSIS — K228 Other specified diseases of esophagus: Secondary | ICD-10-CM | POA: Diagnosis not present

## 2017-10-07 HISTORY — PX: ESOPHAGOGASTRODUODENOSCOPY: SHX5428

## 2017-10-07 SURGERY — EGD (ESOPHAGOGASTRODUODENOSCOPY)
Anesthesia: Moderate Sedation

## 2017-10-07 MED ORDER — ATROPINE SULFATE 1 MG/ML IJ SOLN
INTRAMUSCULAR | Status: DC | PRN
  Administered 2017-10-07: .5 mg via INTRAVENOUS

## 2017-10-07 MED ORDER — STERILE WATER FOR IRRIGATION IR SOLN
Status: DC | PRN
  Administered 2017-10-07: 2.5 mL

## 2017-10-07 MED ORDER — ATROPINE SULFATE 1 MG/ML IJ SOLN
INTRAMUSCULAR | Status: AC
  Filled 2017-10-07: qty 1

## 2017-10-07 MED ORDER — MIDAZOLAM HCL 5 MG/5ML IJ SOLN
INTRAMUSCULAR | Status: DC | PRN
  Administered 2017-10-07 (×4): 2 mg via INTRAVENOUS

## 2017-10-07 MED ORDER — LIDOCAINE VISCOUS 2 % MT SOLN
OROMUCOSAL | Status: AC
  Filled 2017-10-07: qty 15

## 2017-10-07 MED ORDER — MIDAZOLAM HCL 5 MG/5ML IJ SOLN
INTRAMUSCULAR | Status: AC
  Filled 2017-10-07: qty 10

## 2017-10-07 MED ORDER — MEPERIDINE HCL 50 MG/ML IJ SOLN
INTRAMUSCULAR | Status: DC | PRN
  Administered 2017-10-07 (×2): 25 mg via INTRAVENOUS

## 2017-10-07 MED ORDER — SODIUM CHLORIDE 0.9 % IV SOLN
INTRAVENOUS | Status: DC
Start: 2017-10-07 — End: 2017-10-07
  Administered 2017-10-07: 1000 mL via INTRAVENOUS

## 2017-10-07 MED ORDER — MEPERIDINE HCL 50 MG/ML IJ SOLN
INTRAMUSCULAR | Status: AC
  Filled 2017-10-07: qty 1

## 2017-10-07 MED ORDER — LIDOCAINE VISCOUS 2 % MT SOLN
OROMUCOSAL | Status: DC | PRN
  Administered 2017-10-07: 1 via OROMUCOSAL

## 2017-10-07 NOTE — Discharge Instructions (Signed)
Discontinue omeprazole and begin pantoprazole at 40 mg by mouth twice daily.  Take it 30 minutes before breakfast and evening meal. Resume other medications as before. Resume usual diet. No driving for 24 hours. Physician will call with biopsy results. Gastrointestinal Endoscopy, Care After Refer to this sheet in the next few weeks. These instructions provide you with information about caring for yourself after your procedure. Your health care provider may also give you more specific instructions. Your treatment has been planned according to current medical practices, but problems sometimes occur. Call your health care provider if you have any problems or questions after your procedure. What can I expect after the procedure? After your procedure, it is common to feel:  Bloated.  Soreness in your throat.  Sleepy.  Follow these instructions at home:  Do not drive for 24 hours if you received a if you received a medicine to help you relax (sedative).  Avoid drinking warm beverages and alcohol for the first 24 hours after the procedure.  Take over-the-counter and prescription medicines only as told by your health care provider.  Drink enough fluids to keep your urine clear or pale yellow.  If you feel bloated, try going for a walk. Walking may help the feeling go away.  If your throat is sore, try gargling with salt water. Get help right away if:  You have severe nausea or vomiting.  You have severe abdominal pain, abdominal cramps that last longer than 6 hours, or abdominal swelling.  You have severe shoulder or back pain.  You have trouble swallowing.  You have shortness of breath, your breathing is shallow, or you breathing is faster than normal.  You have a fever.  Your heart is beating very fast.  You vomit blood or material that looks like coffee grounds.  You have bloody, black, or tarry stools. This information is not intended to replace advice given to you by your  health care provider. Make sure you discuss any questions you have with your health care provider. Document Released: 06/22/2004 Document Revised: 09/15/2016 Document Reviewed: 08/31/2015 Elsevier Interactive Patient Education  2018 [employer].

## 2017-10-07 NOTE — H&P (Signed)
Ronald Conner is an 56 y.o. male.   Chief Complaint: Patient is here for EGD. HPI: Patient is a 56 year old Caucasian male who was present illness began a few weeks ago when he woke up in the middle of night with profuse diaphoreses and nonbloody diarrhea.  His symptoms occurred within a few hours of eating beef stew.  Since then he has had epigastric fullness tightness chest pain as well as frequent burping.  He was begun on a PPI and felt better but since then he has had a few more episodes.  He has been back to emergency room a few more times.  He has feeling better.  However he still has some burping.  He rarely has heartburn.  Until this episode occurred he took Tums sporadically.  He did not experience nausea vomiting melena or rectal bleeding.  He does not take OTC NSAIDs.  He does not smoke cigarettes or drink alcohol.  Past Medical History:  Diagnosis Date      . GERD (gastroesophageal reflux disease) 10/03/2017  . Hypertension           Past Surgical History:  Procedure Laterality Date  . NO PAST SURGERIES      Family History  Problem Relation Age of Onset  . Arrhythmia Mother        h/o DCCV  . Leukemia Mother   . Hypertension Mother   . Cataracts Mother   . Peripheral Artery Disease Mother   . Heart attack Father 10  . Kidney failure Brother        on dialysis  . Hypertension Brother   . Arrhythmia Maternal Grandmother   . Heart attack Maternal Grandmother   . Stroke Maternal Grandfather   . Peripheral Artery Disease Maternal Grandfather        double amputee  . Dementia Paternal Grandmother   . Diabetes Brother   . Obesity Brother    Social History:  reports that he has been smoking cigars.  His smokeless tobacco use includes snuff. He reports that he does not drink alcohol or use drugs.  Allergies: No Known Allergies  Medications Prior to Admission  Medication Sig Dispense Refill  . aspirin EC 81 MG tablet Take 324 mg by mouth once.    Marland Kitchen  HYDROcodone-acetaminophen (NORCO/VICODIN) 5-325 MG tablet Take 1-2 tablets by mouth every 12 (twelve) hours as needed for moderate pain.     Marland Kitchen lisinopril (PRINIVIL,ZESTRIL) 20 MG tablet Take 1 tablet (20 mg total) by mouth daily. 90 tablet 3  . Multiple Vitamin (MULTIVITAMIN) tablet Take 1 tablet daily by mouth.    Marland Kitchen omeprazole (PRILOSEC) 20 MG capsule Take 1 po BID x 2 weeks then once a day 60 capsule 0  . pantoprazole (PROTONIX) 40 MG tablet Take 1 tablet (40 mg total) daily by mouth. 30 minutes before breakfast 90 tablet 3    No results found for this or any previous visit (from the past 48 hour(s)). No results found.  ROS  Blood pressure (!) 155/98, pulse (!) 41, temperature 97.7 F (36.5 C), temperature source Oral, resp. rate 13, height 5\' 10"  (1.778 m), weight 225 lb (102.1 kg), SpO2 99 %. Physical Exam  Constitutional: He appears well-developed and well-nourished.  HENT:  Mouth/Throat: Oropharynx is clear and moist.  Eyes: Conjunctivae are normal. No scleral icterus.  Neck: No thyromegaly present.  Cardiovascular: Normal rate, regular rhythm and normal heart sounds.  No murmur heard. Respiratory: Effort normal and breath sounds normal.  GI: Soft. He  exhibits no distension and no mass. There is no tenderness.  Musculoskeletal: He exhibits no edema.  Lymphadenopathy:    He has no cervical adenopathy.  Neurological: He is alert.  Skin: Skin is warm and dry.     Assessment/Plan  Noncardiac chest pain. Epigastric fullness and recurrent belching. Diagnostic EGD.   Hildred Laser, MD 10/07/2017, 1:20 PM

## 2017-10-07 NOTE — Op Note (Signed)
[facility] Patient Name: Ronald Conner Procedure Date: 10/07/2017 12:55 PM MRN: [id number] Date of Birth: [birth year] Attending MD: Hildred Laser , MD CSN: [id number] Age: 56 Admit Type: Outpatient Procedure:                Upper GI endoscopy Indications:              Follow-up of gastro-esophageal reflux disease,                            Chest pain (non cardiac) Providers:                Hildred Laser, MD, Lurline Del, RN, Purcell Nails. [place],                            [employer]chant [employer] officer Referring MD:             Redmond School, MD Medicines:                None, Lidocaine spray, Meperidine 50 mg IV,                            Midazolam 8 mg IV Complications:            No immediate complications. Estimated Blood Loss:     Estimated blood loss was minimal. Procedure:                Pre-Anesthesia Assessment:                           - Prior to the procedure, a History and Physical                            was performed, and patient medications and                            allergies were reviewed. The patient's tolerance of                            previous anesthesia was also reviewed. The risks                            and benefits of the procedure and the sedation                            options and risks were discussed with the patient.                            All questions were answered, and informed consent                            was obtained. ASA Grade Assessment: II - A patient                            with mild systemic disease. After reviewing the  risks and benefits, the patient was deemed in                            satisfactory condition to undergo the procedure.                           After obtaining informed consent, the endoscope was                            passed under direct vision. Throughout the                            procedure, the patient's blood pressure, pulse, and                            oxygen  saturations were monitored continuously. The                            [id number]-[id number] ([id number]) scope was introduced through the                            mouth, and advanced to the second part of duodenum.                            The upper GI endoscopy was accomplished without                            difficulty. The patient tolerated the procedure                            well. Scope In: 1:37:14 PM Scope Out: 1:48:52 PM Total Procedure Duration: 0 hours 11 minutes 38 seconds  Findings:      The proximal esophagus and mid esophagus were normal.      There were esophageal mucosal changes suspicious for short-segment       Barrett's esophagus present in the distal esophagus. There are two       patches.. The maximum longitudinal extent of these mucosal changes was       20 cm in length. Mucosa was biopsied with a cold forceps for histology       in the lower third of the esophagus. One specimen bottle was sent to       pathology.      The Z-line was irregular and was found 38 cm from the incisors.      A 2 cm hiatal hernia was present.      Two small sessile polyps with no stigmata of recent bleeding were found       in the gastric body. Biopsies were taken with a cold forceps for       histology. The pathology specimen was placed into Bottle Number 1.      The exam of the stomach was otherwise normal.      The duodenal bulb and second portion of the duodenum were normal. Impression:               - Normal proximal esophagus and mid esophagus.                           -  Esophageal mucosal changes suspicious for                            short-segment Barrett's esophagus. Biopsied.                           - Z-line irregular, 38 cm from the incisors.                           - 2 cm hiatal hernia.                           - Two gastric polyps. Biopsied.                           - Normal duodenal bulb and second portion of the                            duodenum. Moderate  Sedation:      Moderate (conscious) sedation was administered by the endoscopy nurse       and supervised by the endoscopist. The following parameters were       monitored: oxygen saturation, heart rate, blood pressure, CO2       capnography and response to care. Total physician intraservice time was       21 minutes. Recommendation:           - Patient has a contact number available for                            emergencies. The signs and symptoms of potential                            delayed complications were discussed with the                            patient. Return to normal activities tomorrow.                            Written discharge instructions were provided to the                            patient.                           - Resume previous diet today.                           - Continue present medications.                           - Await pathology results.                           - Return to GI clinic in 3 months. Procedure Code(s):        --- Professional ---  67893, Esophagogastroduodenoscopy, flexible,                            transoral; with biopsy, single or multiple                           99152, Moderate sedation services provided by the                            same physician or other qualified health care                            professional performing the diagnostic or                            therapeutic service that the sedation supports,                            requiring the presence of an independent trained                            observer to assist in the monitoring of the                            patient's level of consciousness and physiological                            status; initial 15 minutes of intraservice time,                            patient age 70 years or older Diagnosis Code(s):        --- Professional ---                           K22.8, Other specified diseases of esophagus                            K44.9, Diaphragmatic hernia without obstruction or                            gangrene                           K31.7, Polyp of stomach and duodenum                           K21.9, Gastro-esophageal reflux disease without                            esophagitis                           R07.89, Other chest pain CPT copyright 2016 American Medical Association. All rights reserved. The codes documented in this report are preliminary and upon coder review may  be revised to meet current compliance requirements. Hildred Laser, MD Hildred Laser, MD 10/07/2017  2:12:34 PM This report has been signed electronically. Number of Addenda: 0

## 2017-10-07 NOTE — Progress Notes (Deleted)
Cardiology Office Note    Date:  10/07/2017   ID:  Ronald Conner, DOB [birth year], MRN [id number]  PCP:  Redmond School, MD  Cardiologist: Dr. Harl Bowie  No chief complaint on file.   History of Present Illness:    Ronald Conner is a 56 y.o. male with past medical history of HTN, GERD, and bradycardia who presents to the office today for Emergency Department follow-up.   He was last evaluated by Dr. Harl Bowie in 05/2017 and reported having an episode two weeks prior with headaches and a "funny feeling" along his chest at which time he checked his BP and it was significantly elevated to 228/113. He presented to the ED for further evaluation and was found to have a HR in the 40's with frequent PVC's noted. Lab work showed he was hypokalemic at the time, therefore HCTZ was discontinued at the time of his appointment and Lisinopril was further titrated to 20mg  daily.   He was evaluated in the ER on 3 separate occasions in 08/2017 for chest pain. His pain was thought to be atypical during his visits, as pain was often worse with taking a deep breath or relieved with bowel movements. Troponin values were negative during his visits and his EKG's showed no acute ischemic changes. Was started on Omeprazole. His most recent ED visit was on 09/28/2017 at which time he reported having constant pain across his chest which had been constant since onset. Cardiac enzymes were again negative and his pain resolved with Protonix. He was informed to follow up with GI in the outpatient setting.     Past Medical History:  Diagnosis Date  . Acid reflux   . GERD (gastroesophageal reflux disease) 10/03/2017  . Hypertension     Past Surgical History:  Procedure Laterality Date  . NO PAST SURGERIES      Current Medications: Outpatient Medications Prior to Visit  Medication Sig Dispense Refill  . aspirin EC 81 MG tablet Take 324 mg by mouth once.    Marland Kitchen HYDROcodone-acetaminophen (NORCO/VICODIN) 5-325 MG  tablet Take 1-2 tablets by mouth every 12 (twelve) hours as needed for moderate pain.     Marland Kitchen lisinopril (PRINIVIL,ZESTRIL) 20 MG tablet Take 1 tablet (20 mg total) by mouth daily. 90 tablet 3  . Multiple Vitamin (MULTIVITAMIN) tablet Take 1 tablet daily by mouth.    . pantoprazole (PROTONIX) 40 MG tablet Take 1 tablet (40 mg total) daily by mouth. 30 minutes before breakfast 90 tablet 3   Facility-Administered Medications Prior to Visit  Medication Dose Route Frequency Provider Last Rate Last Dose  . 0.9 %  sodium chloride infusion   Intravenous Continuous Setzer, Terri L, NP   Stopped at 10/07/17 1430  . atropine 1 MG/ML injection           . lidocaine (XYLOCAINE) 2 % viscous mouth solution              Allergies:   Patient has no known allergies.   Social History   Socioeconomic History  . Marital status: Married    Spouse name: Not on file  . Number of children: Not on file  . Years of education: Not on file  . Highest education level: Not on file  Social Needs  . Financial resource strain: Not on file  . Food insecurity - worry: Not on file  . Food insecurity - inability: Not on file  . Transportation needs - medical: Not on file  . Transportation needs - non-medical:  Not on file  Occupational History  . Not on file  Tobacco Use  . Smoking status: Current Every Day Smoker    Types: Cigars  . Smokeless tobacco: Current User    Types: Snuff  . Tobacco comment: dips snuff for 25 years.cigar once or twicea year  Substance and Sexual Activity  . Alcohol use: No  . Drug use: No  . Sexual activity: Not on file  Other Topics Concern  . Not on file  Social History Narrative  . Not on file     Family History:  The patient's ***family history includes Arrhythmia in his maternal grandmother and mother; Cataracts in his mother; Dementia in his paternal grandmother; Diabetes in his brother; Heart attack in his maternal grandmother; Heart attack (age of onset: 45) in his father;  Hypertension in his brother and mother; Kidney failure in his brother; Leukemia in his mother; Obesity in his brother; Peripheral Artery Disease in his maternal grandfather and mother; Stroke in his maternal grandfather.   Review of Systems:   Please see the history of present illness.     General:  No chills, fever, night sweats or weight changes.  Cardiovascular:  No chest pain, dyspnea on exertion, edema, orthopnea, palpitations, paroxysmal nocturnal dyspnea. Dermatological: No rash, lesions/masses Respiratory: No cough, dyspnea Urologic: No hematuria, dysuria Abdominal:   No nausea, vomiting, diarrhea, bright red blood per rectum, melena, or hematemesis Neurologic:  No visual changes, wkns, changes in mental status. All other systems reviewed and are otherwise negative except as noted above.   Physical Exam:    VS:  There were no vitals taken for this visit.   General: Well developed, well nourished,male appearing in no acute distress. Head: Normocephalic, atraumatic, sclera non-icteric, no xanthomas, nares are without discharge.  Neck: No carotid bruits. JVD not elevated.  Lungs: Respirations regular and unlabored, without wheezes or rales.  Heart: ***Regular rate and rhythm. No S3 or S4.  No murmur, no rubs, or gallops appreciated. Abdomen: Soft, non-tender, non-distended with normoactive bowel sounds. No hepatomegaly. No rebound/guarding. No obvious abdominal masses. Msk:  Strength and tone appear normal for age. No joint deformities or effusions. Extremities: No clubbing or cyanosis. No edema.  Distal pedal pulses are 2+ bilaterally. Neuro: Alert and oriented X 3. Moves all extremities spontaneously. No focal deficits noted. Psych:  Responds to questions appropriately with a normal affect. Skin: No rashes or lesions noted  Wt Readings from Last 3 Encounters:  10/07/17 225 lb (102.1 kg)  10/03/17 224 lb 4.8 oz (101.7 kg)  09/28/17 235 lb (106.6 kg)        Studies/Labs  Reviewed:   EKG:  EKG is*** ordered today.  The ekg ordered today demonstrates ***  Recent Labs: 05/06/2017: TSH 0.817 05/23/2017: Magnesium 2.0 09/28/2017: ALT 24; BUN 9; Creatinine, Ser 0.87; Hemoglobin 14.9; Platelets 206; Potassium 3.7; Sodium 138   Lipid Panel No results found for: CHOL, TRIG, HDL, CHOLHDL, VLDL, LDLCALC, LDLDIRECT  Additional studies/ records that were reviewed today include:   CXR: 09/28/2017 FINDINGS: The heart size and mediastinal contours are within normal limits. Both lungs are clear. The visualized skeletal structures are unremarkable.  IMPRESSION: No active cardiopulmonary disease.   Assessment:    No diagnosis found.   Plan:   In order of problems listed above:  1. ***    Medication Adjustments/Labs and Tests Ordered: Current medicines are reviewed at length with the patient today.  Concerns regarding medicines are outlined above.  Medication changes, Labs and Tests  ordered today are listed in the Patient Instructions below. There are no Patient Instructions on file for this visit.   Signed, Erma Heritage, PA-C  10/07/2017 3:40 PM    [facility] [place], [place], [place]  [address] Phone: [phone]; Fax: [phone]  [address] [place], [place] [address] Phone: [phone]

## 2017-10-10 ENCOUNTER — Ambulatory Visit: Payer: Medicare Other | Admitting: Student

## 2017-10-10 ENCOUNTER — Encounter (INDEPENDENT_AMBULATORY_CARE_PROVIDER_SITE_OTHER): Payer: Self-pay | Admitting: *Deleted

## 2017-10-11 ENCOUNTER — Emergency Department (HOSPITAL_COMMUNITY)
Admission: EM | Admit: 2017-10-11 | Discharge: 2017-10-11 | Disposition: A | Discharge status: Home / Self Care | Payer: Medicare Other | Attending: Emergency Medicine | Admitting: Emergency Medicine

## 2017-10-11 ENCOUNTER — Other Ambulatory Visit: Payer: Self-pay

## 2017-10-11 ENCOUNTER — Emergency Department (HOSPITAL_COMMUNITY): Payer: Medicare Other

## 2017-10-11 ENCOUNTER — Encounter (HOSPITAL_COMMUNITY): Payer: Self-pay | Admitting: *Deleted

## 2017-10-11 ENCOUNTER — Telehealth (INDEPENDENT_AMBULATORY_CARE_PROVIDER_SITE_OTHER): Payer: Self-pay | Admitting: Internal Medicine

## 2017-10-11 ENCOUNTER — Encounter (INDEPENDENT_AMBULATORY_CARE_PROVIDER_SITE_OTHER): Payer: Self-pay | Admitting: Internal Medicine

## 2017-10-11 DIAGNOSIS — R110 Nausea: Secondary | ICD-10-CM | POA: Insufficient documentation

## 2017-10-11 DIAGNOSIS — Z7982 Long term (current) use of aspirin: Secondary | ICD-10-CM | POA: Diagnosis not present

## 2017-10-11 DIAGNOSIS — I10 Essential (primary) hypertension: Secondary | ICD-10-CM | POA: Diagnosis not present

## 2017-10-11 DIAGNOSIS — K760 Fatty (change of) liver, not elsewhere classified: Secondary | ICD-10-CM | POA: Diagnosis not present

## 2017-10-11 DIAGNOSIS — F17290 Nicotine dependence, other tobacco product, uncomplicated: Secondary | ICD-10-CM | POA: Insufficient documentation

## 2017-10-11 DIAGNOSIS — R1011 Right upper quadrant pain: Secondary | ICD-10-CM | POA: Insufficient documentation

## 2017-10-11 DIAGNOSIS — R0789 Other chest pain: Secondary | ICD-10-CM | POA: Insufficient documentation

## 2017-10-11 DIAGNOSIS — R109 Unspecified abdominal pain: Secondary | ICD-10-CM | POA: Diagnosis not present

## 2017-10-11 DIAGNOSIS — M25511 Pain in right shoulder: Secondary | ICD-10-CM | POA: Insufficient documentation

## 2017-10-11 DIAGNOSIS — R1031 Right lower quadrant pain: Secondary | ICD-10-CM | POA: Diagnosis not present

## 2017-10-11 LAB — CBC WITH DIFFERENTIAL/PLATELET
Basophils Absolute: 0 10*3/uL (ref 0.0–0.1)
Basophils Relative: 0 %
EOS PCT: 4 %
Eosinophils Absolute: 0.2 10*3/uL (ref 0.0–0.7)
HEMATOCRIT: 42.3 % (ref 39.0–52.0)
Hemoglobin: 14.4 g/dL (ref 13.0–17.0)
LYMPHS ABS: 2.5 10*3/uL (ref 0.7–4.0)
LYMPHS PCT: 39 %
MCH: 29.3 pg (ref 26.0–34.0)
MCHC: 34 g/dL (ref 30.0–36.0)
MCV: 86 fL (ref 78.0–100.0)
MONO ABS: 0.7 10*3/uL (ref 0.1–1.0)
Monocytes Relative: 10 %
NEUTROS ABS: 3.1 10*3/uL (ref 1.7–7.7)
Neutrophils Relative %: 47 %
PLATELETS: 200 10*3/uL (ref 150–400)
RBC: 4.92 MIL/uL (ref 4.22–5.81)
RDW: 13.2 % (ref 11.5–15.5)
WBC: 6.6 10*3/uL (ref 4.0–10.5)

## 2017-10-11 LAB — COMPREHENSIVE METABOLIC PANEL
ALT: 23 U/L (ref 17–63)
AST: 26 U/L (ref 15–41)
Albumin: 3.9 g/dL (ref 3.5–5.0)
Alkaline Phosphatase: 59 U/L (ref 38–126)
Anion gap: 9 (ref 5–15)
BILIRUBIN TOTAL: 1 mg/dL (ref 0.3–1.2)
BUN: 8 mg/dL (ref 6–20)
CALCIUM: 8.8 mg/dL — AB (ref 8.9–10.3)
CO2: 24 mmol/L (ref 22–32)
Chloride: 107 mmol/L (ref 101–111)
Creatinine, Ser: 0.92 mg/dL (ref 0.61–1.24)
GFR calc Af Amer: >60 mL/min (ref >60)
GFR calc non Af Amer: >60 mL/min (ref >60)
GLUCOSE: 103 mg/dL — AB (ref 65–99)
POTASSIUM: 3.2 mmol/L — AB (ref 3.5–5.1)
Sodium: 140 mmol/L (ref 135–145)
Total Protein: 6.9 g/dL (ref 6.5–8.1)

## 2017-10-11 LAB — LIPASE, BLOOD: Lipase: 32 U/L (ref 11–51)

## 2017-10-11 LAB — TROPONIN I: Troponin I: <0.03 ng/mL (ref <0.03)

## 2017-10-11 MED ORDER — ONDANSETRON 4 MG PO TBDP: 4.0000 mg | ORAL_TABLET | Freq: Three times a day (TID) | ORAL | 0 refills | Status: DC | PRN

## 2017-10-11 MED ORDER — GI COCKTAIL ~~LOC~~
30.0000 mL | Freq: Once | ORAL | Status: DC
  Filled 2017-10-11: qty 30

## 2017-10-11 MED ORDER — SUCRALFATE 1 G PO TABS: 1.0000 g | ORAL_TABLET | Freq: Three times a day (TID) | ORAL | 0 refills | Status: DC

## 2017-10-11 MED ORDER — POTASSIUM CHLORIDE CRYS ER 20 MEQ PO TBCR
40.0000 meq | EXTENDED_RELEASE_TABLET | Freq: Once | ORAL | Status: AC
  Administered 2017-10-11: 40 meq via ORAL
  Filled 2017-10-11: qty 2

## 2017-10-11 NOTE — Telephone Encounter (Signed)
HIDA scan sch'd 10/12/17 at 800 (745), npo after midnight, no pain meds after midnight, patient's wife aware

## 2017-10-11 NOTE — ED Triage Notes (Signed)
Pt c/o right abdominal pain that started x 30 mins ago and states the pain radiated to in between his shoulder blades;

## 2017-10-11 NOTE — ED Notes (Signed)
Pt transported to [facility]

## 2017-10-11 NOTE — Discharge Instructions (Signed)
Continue your protonix. Follow up with Dr. Laural Golden. Return to the ED if you develop new or worsening symptoms.

## 2017-10-11 NOTE — ED Provider Notes (Signed)
[facility] EMERGENCY DEPARTMENT Provider Note   CSN: [id number] Arrival date & time: 10/11/17  0404     History   Chief Complaint Chief Complaint  Patient presents with  . Abdominal Pain    HPI Ronald Conner is a 56 y.o. male.   Patient presents with right-sided abdominal pain that woke him from sleep.  Reports it is starting to improve.  Pain is in his right upper abdomen and radiated to his right shoulder and back.  Associated with nausea and diaphoresis.  Patient states this is a different location than his typical acid reflux pain.  He has had multiple ED visits this month for acid reflux pain with belching and nausea.  States he has never had pain in his abdomen like this before.  Underwent EGD 4 days ago that showed hiatal hernia and Barrett's esophagus.  He is still waiting for biopsy results.  Reports compliance with his PPI.  Has diarrhea at baseline which she attributes to his PPI.  Denies any chest pain or shortness of breath.  Has had nausea but no vomiting.  No fever.  No pain with urination or hematuria.   The history is provided by the patient.  Abdominal Pain   Associated symptoms include nausea. Pertinent negatives include fever, vomiting, dysuria, hematuria, headaches, arthralgias and myalgias.    Past Medical History:  Diagnosis Date  . Acid reflux   . GERD (gastroesophageal reflux disease) 10/03/2017  . Hypertension     Patient Active Problem List   Diagnosis Date Noted  . GERD (gastroesophageal reflux disease) 10/03/2017  . Gastroesophageal reflux disease without esophagitis 10/03/2017    Past Surgical History:  Procedure Laterality Date  . NO PAST SURGERIES         Home Medications    Prior to Admission medications   Medication Sig Start Date End Date Taking? Authorizing Provider  aspirin EC 81 MG tablet Take 324 mg by mouth once.    [provider]  HYDROcodone-acetaminophen (NORCO/VICODIN) 5-325 MG tablet Take 1-2 tablets by  mouth every 12 (twelve) hours as needed for moderate pain.     [provider]  lisinopril (PRINIVIL,ZESTRIL) 20 MG tablet Take 1 tablet (20 mg total) by mouth daily. 05/06/17 10/07/17  Arnoldo Lenis, MD  Multiple Vitamin (MULTIVITAMIN) tablet Take 1 tablet daily by mouth.    [provider]  pantoprazole (PROTONIX) 40 MG tablet Take 1 tablet (40 mg total) daily by mouth. 30 minutes before breakfast 10/03/17   Setzer, Rona Ravens, NP    Family History Family History  Problem Relation Age of Onset  . Arrhythmia Mother        h/o DCCV  . Leukemia Mother   . Hypertension Mother   . Cataracts Mother   . Peripheral Artery Disease Mother   . Heart attack Father 56  . Kidney failure Brother        on dialysis  . Hypertension Brother   . Arrhythmia Maternal Grandmother   . Heart attack Maternal Grandmother   . Stroke Maternal Grandfather   . Peripheral Artery Disease Maternal Grandfather        double amputee  . Dementia Paternal Grandmother   . Diabetes Brother   . Obesity Brother     Social History Social History   Tobacco Use  . Smoking status: Current Every Day Smoker    Types: Cigars  . Smokeless tobacco: Current User    Types: Snuff  . Tobacco comment: dips snuff for 25  years.cigar once or twicea year  Substance Use Topics  . Alcohol use: No  . Drug use: No     Allergies   Patient has no known allergies.   Review of Systems Review of Systems  Constitutional: Negative for activity change, appetite change and fever.  HENT: Negative for congestion and rhinorrhea.   Respiratory: Negative for cough, chest tightness and shortness of breath.   Cardiovascular: Negative for chest pain.  Gastrointestinal: Positive for abdominal pain and nausea. Negative for vomiting.  Genitourinary: Negative for dysuria, hematuria, testicular pain and urgency.  Musculoskeletal: Positive for back pain. Negative for arthralgias and myalgias.  Skin: Negative for rash.    Neurological: Negative for dizziness, weakness and headaches.    all other systems are negative except as noted in the HPI and PMH.    Physical Exam Updated Vital Signs BP (!) 138/95 (BP Location: Right Arm)   Pulse (!) 45   Temp 97.7 F (36.5 C) (Oral)   Resp 16   Ht 5\' 10"  (1.778 m)   Wt 102.1 kg (225 lb)   SpO2 100%   BMI 32.28 kg/m   Physical Exam  Constitutional: He is oriented to person, place, and time. He appears well-developed and well-nourished. No distress.  HENT:  Head: Normocephalic and atraumatic.  Mouth/Throat: Oropharynx is clear and moist. No oropharyngeal exudate.  Eyes: Conjunctivae and EOM are normal. Pupils are equal, round, and reactive to light.  Neck: Normal range of motion. Neck supple.  No meningismus.  Cardiovascular: Normal rate, normal heart sounds and intact distal pulses.  No murmur heard. bradycardic  Pulmonary/Chest: Effort normal and breath sounds normal. No respiratory distress.  Abdominal: Soft. There is tenderness. There is no rebound and no guarding.  TTP RUQ and epigastrium. No guarding or rebound  Musculoskeletal: Normal range of motion. He exhibits no edema or tenderness.  nO CVAT  Neurological: He is alert and oriented to person, place, and time. No cranial nerve deficit. He exhibits normal muscle tone. Coordination normal.  No ataxia on finger to nose bilaterally. No pronator drift. 5/5 strength throughout. CN 2-12 intact.Equal grip strength. Sensation intact.   Skin: Skin is warm.  Psychiatric: He has a normal mood and affect. His behavior is normal.  Nursing note and vitals reviewed.    ED Treatments / Results  Labs (all labs ordered are listed, but only abnormal results are displayed) Labs Reviewed  COMPREHENSIVE METABOLIC PANEL - Abnormal; Notable for the following components:      Result Value   Potassium 3.2 (*)    Glucose, Bld 103 (*)    Calcium 8.8 (*)    All other components within normal limits  CBC WITH  DIFFERENTIAL/PLATELET  LIPASE, BLOOD  TROPONIN I  TROPONIN I    EKG  EKG Interpretation  Date/Time:  Tuesday October 11 2017 04:25:01 EST Ventricular Rate:  47 PR Interval:    QRS Duration: 117 QT Interval:  449 QTC Calculation: 397 R Axis:   -8 Text Interpretation:  Sinus bradycardia Nonspecific intraventricular conduction delay No significant change was found Confirmed by Ezequiel Essex [phone]) on 10/11/2017 4:41:42 AM       Radiology Dg Abdomen Acute W/chest  Result Date: 10/11/2017 CLINICAL DATA:  Right-sided abdominal pain starting 30 minutes ago a radiating to the shoulder blades. EXAM: DG ABDOMEN ACUTE W/ 1V CHEST COMPARISON:  Chest 09/28/2017 FINDINGS: Normal heart size and pulmonary vascularity. No focal airspace disease or consolidation in the lungs. No blunting of costophrenic angles. No pneumothorax. Mediastinal  contours appear intact. Scattered gas and stool in the colon. No small or large bowel distention. No free intra-abdominal air. No abnormal air-fluid levels. No radiopaque stones. Visualized bones appear intact. IMPRESSION: No evidence of active pulmonary disease. Nonobstructive bowel gas pattern. Electronically Signed   By: Lucienne Capers M.D.   On: 10/11/2017 04:58   US Abdomen Limited Ruq  Result Date: 10/11/2017 CLINICAL DATA:  Right upper quadrant pain for the past 3 hours. EXAM: ULTRASOUND ABDOMEN LIMITED RIGHT UPPER QUADRANT COMPARISON:  None. FINDINGS: Gallbladder: No gallstones or wall thickening visualized. No sonographic Murphy sign noted by sonographer. Common bile duct: Diameter: 4 mm, normal. Liver: No focal lesion identified. Increased parenchymal echogenicity. Portal vein is patent on color Doppler imaging with normal direction of blood flow towards the liver. IMPRESSION: Hepatic steatosis.  No acute abnormality. Electronically Signed   By: Titus Dubin M.D.   On: 10/11/2017 08:04    Procedures Procedures (including critical care  time)  Medications Ordered in ED Medications  gi cocktail (Maalox,Lidocaine,Donnatal) (30 mLs Oral Not Given 10/11/17 0445)     Initial Impression / Assessment and Plan / ED Course  I have reviewed the triage vital signs and the nursing notes.  Pertinent labs & imaging results that were available during my care of the patient were reviewed by me and considered in my medical decision making (see chart for details).    Patient with history of acid reflux presenting with right-sided abdominal pain that woke him from sleep.  Different location than his usual pain.  Did have EGD 4 days ago that showed Barrett's esophagus and hiatal hernia.  EKG is sinus bradycardia, similar to previous.  Lipase and LFTs are normal. AAS negative.  Troponin negative x2.  Patient's abdominal pain has improved.  Suspect his pain may be due to his ongoing hiatal hernia and esophagitis issues.  Low suspicion for ACS.   RUQ [place] will be obtained to evaluate gallbladder.  Gallbladder normal on [place]. Pain has improved. Bradycardia stable and asymptomatic.   Follow up with GI, continue PPI. Return precautions discussed.   Final Clinical Impressions(s) / ED Diagnoses   Final diagnoses:  RUQ pain    ED Discharge Orders    None       Elchonon Maxson, Annie Main, MD 10/11/17 [phone]

## 2017-10-11 NOTE — Telephone Encounter (Signed)
Ann, HIDA scan 

## 2017-10-12 ENCOUNTER — Encounter (HOSPITAL_COMMUNITY): Payer: Medicare Other

## 2017-10-17 ENCOUNTER — Other Ambulatory Visit: Payer: Self-pay

## 2017-10-17 NOTE — Patient Outreach (Signed)
[facility] Care Management  10/17/2017  Ronald Conner June 26, 1961 [id number]   Multiple attempts to establish contact with patient without success. No response from letter mailed to patient. Case is being closed at this time.    Plan: RN Health Coach will notify came management assistant of case closure.  Lazaro Arms RN, BSN, [place] Direct Dial:  [phone] Fax: [phone]

## 2017-10-18 ENCOUNTER — Encounter (INDEPENDENT_AMBULATORY_CARE_PROVIDER_SITE_OTHER): Payer: Self-pay | Admitting: Internal Medicine

## 2017-10-18 ENCOUNTER — Telehealth (INDEPENDENT_AMBULATORY_CARE_PROVIDER_SITE_OTHER): Payer: Self-pay | Admitting: Internal Medicine

## 2017-10-18 NOTE — Telephone Encounter (Signed)
Patient's spouse, Bethena Roys called, lmoam stating that the patient is having some stomach pains again.  Stated that he spoke to Dr. Laural Golden last night and was doing fine.  Then got up in the middle of the night belching and belly button is blown.  She stated that his pain level is a 5 on a scale of 1-10.  [phone]

## 2017-10-18 NOTE — Telephone Encounter (Signed)
Precede with Hiida scan per Dr.Rehman. I will call and let the patient's wife know , that Ronald Conner will contact them as to when they are to go.

## 2017-10-19 ENCOUNTER — Other Ambulatory Visit (INDEPENDENT_AMBULATORY_CARE_PROVIDER_SITE_OTHER): Payer: Self-pay | Admitting: *Deleted

## 2017-10-19 MED ORDER — HYDROCODONE-ACETAMINOPHEN 5-325 MG PO TABS: 1.0000 | ORAL_TABLET | Freq: Four times a day (QID) | ORAL | 0 refills | Status: DC | PRN

## 2017-10-19 NOTE — Telephone Encounter (Signed)
HIDA scan resch'd to 10/24/17 at 800 am (745), npo after midnight, no pain meds after midnight -- sent MyChart message advising patient of appt.

## 2017-10-19 NOTE — Telephone Encounter (Signed)
Patient had a [place] 8 days ago , EGD has been done.  Dr.Rehman had called the patient to go over his results and the patient told Dr.Rehman he was feeling better. Early in the following morning , the patient began to have severe pain upper right quadrant.  His Hida Scan has been rescheduled for Monday December 3 rd.  Patient is aware but due to the pain being so bad he request something that may help with the pain. Dr.Rehman stated that we can call in Hydrocodone 5-325 mg take 1 by mouth every 6 hours as needed for severe pain. If the patient develops as fever or the pain medication does not help the discomfort , the patient is to go to the ED for further evaluation.  Patient's wife was called and made aware and due to this drug being a C2 , and Dr.Rehman is in procedures , Raeanne Gathers has agreed to sign the prescription.

## 2017-10-24 ENCOUNTER — Encounter (HOSPITAL_COMMUNITY): Payer: Self-pay

## 2017-10-24 ENCOUNTER — Encounter (HOSPITAL_COMMUNITY)
Admission: RE | Admit: 2017-10-24 | Discharge: 2017-10-24 | Disposition: A | Discharge status: Home / Self Care | Payer: Medicare Other | Source: Ambulatory Visit | Attending: Internal Medicine | Admitting: Internal Medicine

## 2017-10-24 DIAGNOSIS — R1011 Right upper quadrant pain: Secondary | ICD-10-CM | POA: Diagnosis not present

## 2017-10-24 DIAGNOSIS — R109 Unspecified abdominal pain: Secondary | ICD-10-CM | POA: Diagnosis not present

## 2017-10-24 MED ORDER — TECHNETIUM TC 99M MEBROFENIN IV KIT
5.0000 | PACK | Freq: Once | INTRAVENOUS | Status: AC | PRN
  Administered 2017-10-24: 5.3 via INTRAVENOUS

## 2017-10-26 ENCOUNTER — Other Ambulatory Visit (INDEPENDENT_AMBULATORY_CARE_PROVIDER_SITE_OTHER): Payer: Self-pay | Admitting: Internal Medicine

## 2017-10-26 ENCOUNTER — Telehealth (INDEPENDENT_AMBULATORY_CARE_PROVIDER_SITE_OTHER): Payer: Self-pay | Admitting: Internal Medicine

## 2017-10-26 ENCOUNTER — Encounter (INDEPENDENT_AMBULATORY_CARE_PROVIDER_SITE_OTHER): Payer: Self-pay | Admitting: Internal Medicine

## 2017-10-26 DIAGNOSIS — R948 Abnormal results of function studies of other organs and systems: Secondary | ICD-10-CM

## 2017-10-26 NOTE — Telephone Encounter (Signed)
Dr Arnoldo Morale office will contact patient to schedule appt

## 2017-10-26 NOTE — Telephone Encounter (Signed)
Ann, Referral to Dr. Arnoldo Morale. I have ordered

## 2017-10-27 ENCOUNTER — Encounter (HOSPITAL_COMMUNITY): Payer: Self-pay

## 2017-10-27 ENCOUNTER — Emergency Department (HOSPITAL_COMMUNITY): Payer: Medicare Other

## 2017-10-27 ENCOUNTER — Other Ambulatory Visit: Payer: Self-pay

## 2017-10-27 ENCOUNTER — Emergency Department (HOSPITAL_COMMUNITY)
Admission: EM | Admit: 2017-10-27 | Discharge: 2017-10-27 | Disposition: A | Discharge status: Home / Self Care | Payer: Medicare Other | Attending: Emergency Medicine | Admitting: Emergency Medicine

## 2017-10-27 DIAGNOSIS — Z79899 Other long term (current) drug therapy: Secondary | ICD-10-CM | POA: Diagnosis not present

## 2017-10-27 DIAGNOSIS — R1013 Epigastric pain: Secondary | ICD-10-CM | POA: Insufficient documentation

## 2017-10-27 DIAGNOSIS — K5730 Diverticulosis of large intestine without perforation or abscess without bleeding: Secondary | ICD-10-CM | POA: Diagnosis not present

## 2017-10-27 DIAGNOSIS — R404 Transient alteration of awareness: Secondary | ICD-10-CM | POA: Diagnosis not present

## 2017-10-27 DIAGNOSIS — Z1389 Encounter for screening for other disorder: Secondary | ICD-10-CM | POA: Diagnosis not present

## 2017-10-27 DIAGNOSIS — F17220 Nicotine dependence, chewing tobacco, uncomplicated: Secondary | ICD-10-CM | POA: Diagnosis not present

## 2017-10-27 DIAGNOSIS — K449 Diaphragmatic hernia without obstruction or gangrene: Secondary | ICD-10-CM | POA: Diagnosis not present

## 2017-10-27 DIAGNOSIS — R079 Chest pain, unspecified: Secondary | ICD-10-CM | POA: Diagnosis not present

## 2017-10-27 DIAGNOSIS — I10 Essential (primary) hypertension: Secondary | ICD-10-CM | POA: Insufficient documentation

## 2017-10-27 DIAGNOSIS — R42 Dizziness and giddiness: Secondary | ICD-10-CM | POA: Diagnosis not present

## 2017-10-27 DIAGNOSIS — Z6831 Body mass index (BMI) 31.0-31.9, adult: Secondary | ICD-10-CM | POA: Diagnosis not present

## 2017-10-27 DIAGNOSIS — K2270 Barrett's esophagus without dysplasia: Secondary | ICD-10-CM | POA: Diagnosis not present

## 2017-10-27 LAB — CBC WITH DIFFERENTIAL/PLATELET
BASOS ABS: 0 10*3/uL (ref 0.0–0.1)
Basophils Relative: 1 %
EOS PCT: 4 %
Eosinophils Absolute: 0.3 10*3/uL (ref 0.0–0.7)
HCT: 43.6 % (ref 39.0–52.0)
HEMOGLOBIN: 14.3 g/dL (ref 13.0–17.0)
LYMPHS PCT: 35 %
Lymphs Abs: 2.2 10*3/uL (ref 0.7–4.0)
MCH: 29.1 pg (ref 26.0–34.0)
MCHC: 32.8 g/dL (ref 30.0–36.0)
MCV: 88.8 fL (ref 78.0–100.0)
Monocytes Absolute: 0.5 10*3/uL (ref 0.1–1.0)
Monocytes Relative: 8 %
NEUTROS ABS: 3.2 10*3/uL (ref 1.7–7.7)
NEUTROS PCT: 52 %
PLATELETS: 220 10*3/uL (ref 150–400)
RBC: 4.91 MIL/uL (ref 4.22–5.81)
RDW: 12.8 % (ref 11.5–15.5)
WBC: 6.1 10*3/uL (ref 4.0–10.5)

## 2017-10-27 LAB — COMPREHENSIVE METABOLIC PANEL
ALT: 22 U/L (ref 17–63)
AST: 30 U/L (ref 15–41)
Albumin: 4.2 g/dL (ref 3.5–5.0)
Alkaline Phosphatase: 66 U/L (ref 38–126)
Anion gap: 10 (ref 5–15)
BUN: 7 mg/dL (ref 6–20)
CHLORIDE: 104 mmol/L (ref 101–111)
CO2: 23 mmol/L (ref 22–32)
CREATININE: 0.85 mg/dL (ref 0.61–1.24)
Calcium: 9.1 mg/dL (ref 8.9–10.3)
GFR calc Af Amer: >60 mL/min (ref >60)
Glucose, Bld: 97 mg/dL (ref 65–99)
Potassium: 3.4 mmol/L — ABNORMAL LOW (ref 3.5–5.1)
Sodium: 137 mmol/L (ref 135–145)
Total Bilirubin: 0.7 mg/dL (ref 0.3–1.2)
Total Protein: 7 g/dL (ref 6.5–8.1)

## 2017-10-27 LAB — TROPONIN I: Troponin I: <0.03 ng/mL (ref <0.03)

## 2017-10-27 LAB — LIPASE, BLOOD: LIPASE: 42 U/L (ref 11–51)

## 2017-10-27 MED ORDER — IOPAMIDOL (ISOVUE-300) INJECTION 61%
100.0000 mL | Freq: Once | INTRAVENOUS | Status: AC | PRN
  Administered 2017-10-27: 100 mL via INTRAVENOUS

## 2017-10-27 MED ORDER — POLYETHYLENE GLYCOL 3350 17 G PO PACK: 17.0000 g | PACK | Freq: Every day | ORAL | 0 refills | Status: DC

## 2017-10-27 MED ORDER — GI COCKTAIL ~~LOC~~
30.0000 mL | Freq: Once | ORAL | Status: DC
  Filled 2017-10-27: qty 30

## 2017-10-27 MED ORDER — SODIUM CHLORIDE 0.9 % IV SOLN
INTRAVENOUS | Status: DC
  Administered 2017-10-27: 21:00:00 via INTRAVENOUS

## 2017-10-27 MED ORDER — ONDANSETRON 4 MG PO TBDP
4.0000 mg | ORAL_TABLET | Freq: Once | ORAL | Status: DC
  Filled 2017-10-27: qty 1

## 2017-10-27 NOTE — ED Triage Notes (Signed)
Pt in by [facility] for upper abd discomfort and eipgastric pain, states it feels like his acid reflux.   Pt states pain goes up into his chest as well.

## 2017-10-27 NOTE — Discharge Instructions (Signed)
Follow-up with your GI specialist and can keep your consultation to see Dr. Arnoldo Morale.  Today's workup without any acute findings.  Prescription provided for MiraLAX which you can take daily however CT scan did not show any significant constipation.  Continue your current medications.  Return for any new or worse symptoms.

## 2017-10-27 NOTE — ED Notes (Signed)
Notified RN Eboni that patient called out and needed someone to come check on him. Upon entering the room patient and wife states that patient had a episode like at home where he was unable to swallow or talk, but it had just passed.

## 2017-10-27 NOTE — ED Provider Notes (Signed)
[facility] EMERGENCY DEPARTMENT Provider Note   CSN: [id number] Arrival date & time: 10/27/17  1946     History   Chief Complaint Chief Complaint  Patient presents with  . Abdominal Pain    epigastric pain    HPI THATCHER DOBERSTEIN is a 56 y.o. male.  Patient with a long-standing history of epigastric abdominal pain.  Had an extensive workup.  To include HIDA scan which showed some mild gallbladder dysfunction but marginal.  Upper endoscopy which was negative for peptic ulcer disease.  And also had ultrasound of the abdomen which was negative for gallstones.  Patient followed by Dr. Melony Overly from gastroenterology.  Also has a referral to Dr. Arnoldo Morale general surgery for consideration of the HIDA scan results.  Patient presents tonight with worsening of the epigastric abdominal pain brought in by EMS.  Patient states it feels like his acid reflux.  Patient was seen not too long ago and treated with Carafate he says it helps some.  The pain radiates up into his chest.  Symptoms started earlier today several hours ago.  Patient feels as if he is constipated.      Past Medical History:  Diagnosis Date  . Acid reflux   . GERD (gastroesophageal reflux disease) 10/03/2017  . Hypertension     Patient Active Problem List   Diagnosis Date Noted  . GERD (gastroesophageal reflux disease) 10/03/2017  . Gastroesophageal reflux disease without esophagitis 10/03/2017    Past Surgical History:  Procedure Laterality Date  . ESOPHAGOGASTRODUODENOSCOPY N/A 10/07/2017   Procedure: ESOPHAGOGASTRODUODENOSCOPY (EGD);  Surgeon: Rogene Houston, MD;  Location: [facility];  Service: Endoscopy;  Laterality: N/A;  12:00pm  . NO PAST SURGERIES         Home Medications    Prior to Admission medications   Medication Sig Start Date End Date Taking? Authorizing Provider  dicyclomine (BENTYL) 10 MG capsule Take 10 mg by mouth 4 (four) times daily.   Yes [provider]    HYDROcodone-acetaminophen (NORCO/VICODIN) 5-325 MG tablet Take 1 tablet by mouth every 6 (six) hours as needed for moderate pain. 10/19/17  Yes Setzer, Terri L, NP  lisinopril (PRINIVIL,ZESTRIL) 20 MG tablet Take 1 tablet (20 mg total) by mouth daily. 05/06/17 10/27/17 Yes BranchAlphonse Guild, MD  pantoprazole (PROTONIX) 40 MG tablet Take 1 tablet (40 mg total) daily by mouth. 30 minutes before breakfast 10/03/17  Yes Setzer, Terri L, NP  Simethicone (GAS-X PO) Take 1 capsule by mouth once as needed.   Yes [provider]  sucralfate (CARAFATE) 1 g tablet Take 1 tablet (1 g total) 4 (four) times daily -  with meals and at bedtime by mouth. 10/11/17  Yes Rancour, Annie Main, MD  ondansetron (ZOFRAN ODT) 4 MG disintegrating tablet Take 1 tablet (4 mg total) every 8 (eight) hours as needed by mouth for nausea or vomiting. Patient not taking: Reported on 10/27/2017 10/11/17   Rancour, Annie Main, MD  polyethylene glycol [facility]) packet Take 17 g by mouth daily. 10/27/17   Fredia Sorrow, MD    Family History Family History  Problem Relation Age of Onset  . Arrhythmia Mother        h/o DCCV  . Leukemia Mother   . Hypertension Mother   . Cataracts Mother   . Peripheral Artery Disease Mother   . Heart attack Father 11  . Kidney failure Brother        on dialysis  . Hypertension Brother   . Arrhythmia Maternal Grandmother   .  Heart attack Maternal Grandmother   . Stroke Maternal Grandfather   . Peripheral Artery Disease Maternal Grandfather        double amputee  . Dementia Paternal Grandmother   . Diabetes Brother   . Obesity Brother     Social History Social History   Tobacco Use  . Smoking status: Current Every Day Smoker    Types: Cigars  . Smokeless tobacco: Current User    Types: Snuff  . Tobacco comment: dips snuff for 25 years.cigar once or twicea year  Substance Use Topics  . Alcohol use: No  . Drug use: No     Allergies   Patient has no known  allergies.   Review of Systems Review of Systems  Constitutional: Negative for fever.  HENT: Negative for congestion.   Eyes: Negative for visual disturbance.  Respiratory: Negative for shortness of breath.   Cardiovascular: Positive for chest pain.  Gastrointestinal: Positive for abdominal pain and constipation. Negative for diarrhea, nausea and vomiting.  Genitourinary: Negative for dysuria.  Musculoskeletal: Negative for back pain.  Skin: Negative for rash.  Neurological: Negative for headaches.  Hematological: Does not bruise/bleed easily.  Psychiatric/Behavioral: Negative for confusion.     Physical Exam Updated Vital Signs BP 123/83   Pulse (!) 43   Temp 98.1 F (36.7 C) (Oral)   Resp 16   Ht 1.778 m (5\' 10" )   Wt 98.9 kg (218 lb)   SpO2 97%   BMI 31.28 kg/m   Physical Exam  Constitutional: He is oriented to person, place, and time. He appears well-developed and well-nourished.  Non-toxic appearance. He does not appear ill. No distress.  HENT:  Head: Normocephalic and atraumatic.  Mouth/Throat: Oropharynx is clear and moist.  Eyes: Conjunctivae and EOM are normal. Pupils are equal, round, and reactive to light.  Neck: Normal range of motion.  Cardiovascular: Normal rate and regular rhythm.  Pulmonary/Chest: Effort normal and breath sounds normal. No respiratory distress.  Abdominal: Soft. Bowel sounds are normal. There is no tenderness.  Musculoskeletal: Normal range of motion.  Neurological: He is alert and oriented to person, place, and time. No cranial nerve deficit. He exhibits normal muscle tone. Coordination normal.  Skin: Skin is warm.  Nursing note and vitals reviewed.    ED Treatments / Results  Labs (all labs ordered are listed, but only abnormal results are displayed) Labs Reviewed  COMPREHENSIVE METABOLIC PANEL - Abnormal; Notable for the following components:      Result Value   Potassium 3.4 (*)    All other components within normal limits   CBC WITH DIFFERENTIAL/PLATELET  LIPASE, BLOOD  TROPONIN I    EKG  EKG Interpretation None      ED ECG REPORT   Date: 10/28/2017  Rate: 47  Rhythm: sinus bradycardia  QRS Axis: normal  Intervals: normal  ST/T Wave abnormalities: normal  Conduction Disutrbances:nonspecific intraventricular conduction delay  Narrative Interpretation:   Old EKG Reviewed: none available  I have personally reviewed the EKG tracing and agree with the computerized printout as noted.   Radiology Ct Abdomen Pelvis W Contrast  Result Date: 10/27/2017 CLINICAL DATA:  56 year old male with abdominal infection. Upper abdominal and epigastric pain. EXAM: CT ABDOMEN AND PELVIS WITH CONTRAST TECHNIQUE: Multidetector CT imaging of the abdomen and pelvis was performed using the standard protocol following bolus administration of intravenous contrast. CONTRAST:  121mL ISOVUE-300 IOPAMIDOL (ISOVUE-300) INJECTION 61% COMPARISON:  Abdominal ultrasound dated 10/11/2017 FINDINGS: Lower chest: There is a 4 mm subpleural nodule  at the left lung base. The visualized lung bases are otherwise clear. No intra-abdominal free air or free fluid. Hepatobiliary: Subcentimeter hypodense lesion in the left lobe of the liver is too small to characterize but likely represents a cyst or hemangioma. The liver is otherwise unremarkable. No intrahepatic biliary ductal dilatation. The gallbladder is unremarkable. Pancreas: Unremarkable. No pancreatic ductal dilatation or surrounding inflammatory changes. Spleen: Normal in size without focal abnormality. Adrenals/Urinary Tract: A 1 cm right renal hypodense lesion most consistent with a cyst. The kidney is otherwise unremarkable. There is symmetric enhancement and excretion of contrast by both kidneys. The visualized ureters and urinary bladder appear unremarkable. Stomach/Bowel: There is scattered colonic diverticula without active inflammatory changes. There is no evidence of bowel obstruction or  active inflammation. Normal appendix. Vascular/Lymphatic: Mild aortoiliac atherosclerotic disease. The abdominal aorta and IVC are otherwise unremarkable. No portal venous gas. There is no adenopathy. Reproductive: The prostate and seminal vesicles are grossly unremarkable. Other: Small fat containing umbilical hernia. Musculoskeletal: Degenerative changes of the lumbar spine with disc desiccation and vacuum phenomena. No acute osseous pathology. IMPRESSION: 1. No acute intra-abdominal or pelvic pathology. No bowel obstruction or active inflammation. Normal appendix. 2. Mild colonic diverticulosis. 3. Mild Aortic Atherosclerosis (ICD10-I70.0). Electronically Signed   By: Anner Crete M.D.   On: 10/27/2017 22:29    Procedures Procedures (including critical care time)  Medications Ordered in ED Medications  ondansetron (ZOFRAN-ODT) disintegrating tablet 4 mg (4 mg Oral Refused 10/27/17 2053)  0.9 %  sodium chloride infusion ( Intravenous New Bag/Given 10/27/17 2056)  gi cocktail (Maalox,Lidocaine,Donnatal) (30 mLs Oral Refused 10/27/17 2056)  iopamidol (ISOVUE-300) 61 % injection 100 mL (100 mLs Intravenous Contrast Given 10/27/17 2151)     Initial Impression / Assessment and Plan / ED Course  I have reviewed the triage vital signs and the nursing notes.  Pertinent labs & imaging results that were available during my care of the patient were reviewed by me and considered in my medical decision making (see chart for details).    As stated above patient's had extensive workup for the epigastric abdominal pain.  Has not had a full CT scan of the abdomen.  So we will do that this evening.  We will also rule out any acute cardiac event.  Patient's EKG did not crossover.  But is shows a heart rate of 47 with sinus bradycardia.  Nonspecific conduction delay.  Patient's troponin was negative labs without any significant abnormalities CT scan of the abdomen was negative.  Would recommend continuing  current medications and follow-up with gastroenterology and also keep his appointment with general surgery to go over the HIDA scan results.   Final Clinical Impressions(s) / ED Diagnoses   Final diagnoses:  Epigastric pain    ED Discharge Orders        Ordered    polyethylene glycol (MIRALAX) packet  Daily     10/27/17 2311       Fredia Sorrow, MD 10/28/17 [phone]

## 2017-11-01 ENCOUNTER — Encounter: Payer: Self-pay | Admitting: General Surgery

## 2017-11-02 DIAGNOSIS — E6609 Other obesity due to excess calories: Secondary | ICD-10-CM | POA: Diagnosis not present

## 2017-11-02 DIAGNOSIS — Z6830 Body mass index (BMI) 30.0-30.9, adult: Secondary | ICD-10-CM | POA: Diagnosis not present

## 2017-11-02 DIAGNOSIS — K8050 Calculus of bile duct without cholangitis or cholecystitis without obstruction: Secondary | ICD-10-CM | POA: Diagnosis not present

## 2017-11-02 DIAGNOSIS — I10 Essential (primary) hypertension: Secondary | ICD-10-CM | POA: Diagnosis not present

## 2017-11-02 DIAGNOSIS — R1011 Right upper quadrant pain: Secondary | ICD-10-CM | POA: Diagnosis not present

## 2017-11-03 ENCOUNTER — Other Ambulatory Visit: Payer: Self-pay

## 2017-11-03 ENCOUNTER — Ambulatory Visit (INDEPENDENT_AMBULATORY_CARE_PROVIDER_SITE_OTHER): Payer: Medicare Other | Admitting: General Surgery

## 2017-11-03 ENCOUNTER — Encounter: Payer: Self-pay | Admitting: General Surgery

## 2017-11-03 VITALS — BP 110/71 | HR 54 | Temp 97.7°F | Ht 70.0 in | Wt 213.0 lb

## 2017-11-03 DIAGNOSIS — M47812 Spondylosis without myelopathy or radiculopathy, cervical region: Secondary | ICD-10-CM | POA: Diagnosis not present

## 2017-11-03 DIAGNOSIS — K811 Chronic cholecystitis: Secondary | ICD-10-CM | POA: Diagnosis not present

## 2017-11-03 NOTE — Patient Outreach (Signed)
[facility]) Care Management  11/03/2017  Ronald Conner [birth year] [id number]   1st Telephone call to patient for ED Utilization screening. Wife answered the phone HIPAA complaint voice message left with contact information.    Plan: RN Health Coach will contact the patient within three business days.  Lazaro Arms RN, BSN, [place] Direct Dial:  [phone] Fax: [phone]

## 2017-11-03 NOTE — Patient Outreach (Signed)
[facility]) Care Management  11/03/2017  Ronald Conner 1961/05/06 [id number]   Received call from the patient for ED Utilization screen.  HIPAA verified.  The patient lives with his spouse.  He is doing ok but has some pain. He has transportation to his appointments.   He states that he is adherent with his medications. The patient is having his gallbladder removed on Monday and states that he does not have any needs at this time.  He did request that a consent form be mailed to him so that his wife will be able to speak with medical personnel.  Patient has been assessed and no further interventions are needed at this time.  Plan: [facility] will notify the [facility] assistants of case status.   Lazaro Arms RN, BSN, [place] Direct Dial:  [phone] Fax: [phone]

## 2017-11-03 NOTE — Patient Instructions (Signed)
Ronald Conner  11/03/2017     @PREFPERIOPPHARMACY @   Your procedure is scheduled on   11/07/2017   Report to [facility] at  825   A.M.  Call this number if you have problems the morning of surgery:  [phone]   Remember:  Do not eat food or drink liquids after midnight.  Take these medicines the morning of surgery with A SIP OF WATER  Hydrocodone, lisinopril, zofran, protonix.   Do not wear jewelry, make-up or nail polish.  Do not wear lotions, powders, or perfumes, or deodorant.  Do not shave 48 hours prior to surgery.  Men may shave face and neck.  Do not bring valuables to the hospital.  [facility] is not responsible for any belongings or valuables.  Contacts, dentures or bridgework may not be worn into surgery.  Leave your suitcase in the car.  After surgery it may be brought to your room.  For patients admitted to the hospital, discharge time will be determined by your treatment team.  Patients discharged the day of surgery will not be allowed to drive home.   Name and phone number of your driver:   family Special instructions:  None  Please read over the following fact sheets that you were given. Anesthesia Post-op Instructions and Care and Recovery After Surgery       Laparoscopic Cholecystectomy Laparoscopic cholecystectomy is surgery to remove the gallbladder. The gallbladder is a pear-shaped organ that lies beneath the liver on the right side of the body. The gallbladder stores bile, which is a fluid that helps the body to digest fats. Cholecystectomy is often done for inflammation of the gallbladder (cholecystitis). This condition is usually caused by a buildup of gallstones (cholelithiasis) in the gallbladder. Gallstones can block the flow of bile, which can result in inflammation and pain. In severe cases, emergency surgery may be required. This procedure is done though small incisions in your abdomen (laparoscopic surgery). A thin scope  with a camera (laparoscope) is inserted through one incision. Thin surgical instruments are inserted through the other incisions. In some cases, a laparoscopic procedure may be turned into a type of surgery that is done through a larger incision (open surgery). Tell a health care provider about:  Any allergies you have.  All medicines you are taking, including vitamins, herbs, eye drops, creams, and over-the-counter medicines.  Any problems you or family members have had with anesthetic medicines.  Any blood disorders you have.  Any surgeries you have had.  Any medical conditions you have.  Whether you are pregnant or may be pregnant. What are the risks? Generally, this is a safe procedure. However, problems may occur, including:  Infection.  Bleeding.  Allergic reactions to medicines.  Damage to other structures or organs.  A stone remaining in the common bile duct. The common bile duct carries bile from the gallbladder into the small intestine.  A bile leak from the cyst duct that is clipped when your gallbladder is removed.  What happens before the procedure? Staying hydrated Follow instructions from your health care provider about hydration, which may include:  Up to 2 hours before the procedure - you may continue to drink clear liquids, such as water, clear fruit juice, black coffee, and plain tea.  Eating and drinking restrictions Follow instructions from your health care provider about eating and drinking, which may include:  8 hours before the procedure - stop eating heavy  meals or foods such as meat, fried foods, or fatty foods.  6 hours before the procedure - stop eating light meals or foods, such as toast or cereal.  6 hours before the procedure - stop drinking milk or drinks that contain milk.  2 hours before the procedure - stop drinking clear liquids.  Medicines  Ask your health care provider about: ? Changing or stopping your regular medicines. This  is especially important if you are taking diabetes medicines or blood thinners. ? Taking medicines such as aspirin and ibuprofen. These medicines can thin your blood. Do not take these medicines before your procedure if your health care provider instructs you not to.  You may be given antibiotic medicine to help prevent infection. General instructions  Let your health care provider know if you develop a cold or an infection before surgery.  Plan to have someone take you home from the hospital or clinic.  Ask your health care provider how your surgical site will be marked or identified. What happens during the procedure?  To reduce your risk of infection: ? Your health care team will wash or sanitize their hands. ? Your skin will be washed with soap. ? Hair may be removed from the surgical area.  An IV tube may be inserted into one of your veins.  You will be given one or more of the following: ? A medicine to help you relax (sedative). ? A medicine to make you fall asleep (general anesthetic).  A breathing tube will be placed in your mouth.  Your surgeon will make several small cuts (incisions) in your abdomen.  The laparoscope will be inserted through one of the small incisions. The camera on the laparoscope will send images to a TV screen (monitor) in the operating room. This lets your surgeon see inside your abdomen.  Air-like gas will be pumped into your abdomen. This will expand your abdomen to give the surgeon more room to perform the surgery.  Other tools that are needed for the procedure will be inserted through the other incisions. The gallbladder will be removed through one of the incisions.  Your common bile duct may be examined. If stones are found in the common bile duct, they may be removed.  After your gallbladder has been removed, the incisions will be closed with stitches (sutures), staples, or skin glue.  Your incisions may be covered with a bandage  (dressing). The procedure may vary among health care providers and hospitals. What happens after the procedure?  Your blood pressure, heart rate, breathing rate, and blood oxygen level will be monitored until the medicines you were given have worn off.  You will be given medicines as needed to control your pain.  Do not drive for 24 hours if you were given a sedative. This information is not intended to replace advice given to you by your health care provider. Make sure you discuss any questions you have with your health care provider. Document Released: 11/08/2005 Document Revised: 05/30/2016 Document Reviewed: 04/26/2016 Elsevier Interactive Patient Education  2018 [employer].  Laparoscopic Cholecystectomy, Care After This sheet gives you information about how to care for yourself after your procedure. Your health care provider may also give you more specific instructions. If you have problems or questions, contact your health care provider. What can I expect after the procedure? After the procedure, it is common to have:  Pain at your incision sites. You will be given medicines to control this pain.  Mild nausea or  vomiting.  Bloating and possible shoulder pain from the air-like gas that was used during the procedure.  Follow these instructions at home: Incision care   Follow instructions from your health care provider about how to take care of your incisions. Make sure you: ? Wash your hands with soap and water before you change your bandage (dressing). If soap and water are not available, use hand sanitizer. ? Change your dressing as told by your health care provider. ? Leave stitches (sutures), skin glue, or adhesive strips in place. These skin closures may need to be in place for 2 weeks or longer. If adhesive strip edges start to loosen and curl up, you may trim the loose edges. Do not remove adhesive strips completely unless your health care provider tells you to do  that.  Do not take baths, swim, or use a hot tub until your health care provider approves. Ask your health care provider if you can take showers. You may only be allowed to take sponge baths for bathing.  Check your incision area every day for signs of infection. Check for: ? More redness, swelling, or pain. ? More fluid or blood. ? Warmth. ? Pus or a bad smell. Activity  Do not drive or use heavy machinery while taking prescription pain medicine.  Do not lift anything that is heavier than 10 lb (4.5 kg) until your health care provider approves.  Do not play contact sports until your health care provider approves.  Do not drive for 24 hours if you were given a medicine to help you relax (sedative).  Rest as needed. Do not return to work or school until your health care provider approves. General instructions  Take over-the-counter and prescription medicines only as told by your health care provider.  To prevent or treat constipation while you are taking prescription pain medicine, your health care provider may recommend that you: ? Drink enough fluid to keep your urine clear or pale yellow. ? Take over-the-counter or prescription medicines. ? Eat foods that are high in fiber, such as fresh fruits and vegetables, whole grains, and beans. ? Limit foods that are high in fat and processed sugars, such as fried and sweet foods. Contact a health care provider if:  You develop a rash.  You have more redness, swelling, or pain around your incisions.  You have more fluid or blood coming from your incisions.  Your incisions feel warm to the touch.  You have pus or a bad smell coming from your incisions.  You have a fever.  One or more of your incisions breaks open. Get help right away if:  You have trouble breathing.  You have chest pain.  You have increasing pain in your shoulders.  You faint or feel dizzy when you stand.  You have severe pain in your abdomen.  You have  nausea or vomiting that lasts for more than one day.  You have leg pain. This information is not intended to replace advice given to you by your health care provider. Make sure you discuss any questions you have with your health care provider. Document Released: 11/08/2005 Document Revised: 05/29/2016 Document Reviewed: 04/26/2016 Elsevier Interactive Patient Education  2017 [place] Anesthesia, Adult General anesthesia is the use of medicines to make a person "go to sleep" (be unconscious) for a medical procedure. General anesthesia is often recommended when a procedure:  Is long.  Requires you to be still or in an unusual position.  Is major and can  cause you to lose blood.  Is impossible to do without general anesthesia.  The medicines used for general anesthesia are called general anesthetics. In addition to making you sleep, the medicines:  Prevent pain.  Control your blood pressure.  Relax your muscles.  Tell a health care provider about:  Any allergies you have.  All medicines you are taking, including vitamins, herbs, eye drops, creams, and over-the-counter medicines.  Any problems you or family members have had with anesthetic medicines.  Types of anesthetics you have had in the past.  Any bleeding disorders you have.  Any surgeries you have had.  Any medical conditions you have.  Any history of heart or lung conditions, such as heart failure, sleep apnea, or chronic obstructive pulmonary disease (COPD).  Whether you are pregnant or may be pregnant.  Whether you use tobacco, alcohol, marijuana, or street drugs.  Any history of Armed forces logistics/support/administrative officer.  Any history of depression or anxiety. What are the risks? Generally, this is a safe procedure. However, problems may occur, including:  Allergic reaction to anesthetics.  Lung and heart problems.  Inhaling food or liquids from your stomach into your lungs (aspiration).  Injury to  nerves.  Waking up during your procedure and being unable to move (rare).  Extreme agitation or a state of mental confusion (delirium) when you wake up from the anesthetic.  Air in the bloodstream, which can lead to stroke.  These problems are more likely to develop if you are having a major surgery or if you have an advanced medical condition. You can prevent some of these complications by answering all of your health care provider's questions thoroughly and by following all pre-procedure instructions. General anesthesia can cause side effects, including:  Nausea or vomiting  A sore throat from the breathing tube.  Feeling cold or shivery.  Feeling tired, washed out, or achy.  Sleepiness or drowsiness.  Confusion or agitation.  What happens before the procedure? Staying hydrated Follow instructions from your health care provider about hydration, which may include:  Up to 2 hours before the procedure - you may continue to drink clear liquids, such as water, clear fruit juice, black coffee, and plain tea.  Eating and drinking restrictions Follow instructions from your health care provider about eating and drinking, which may include:  8 hours before the procedure - stop eating heavy meals or foods such as meat, fried foods, or fatty foods.  6 hours before the procedure - stop eating light meals or foods, such as toast or cereal.  6 hours before the procedure - stop drinking milk or drinks that contain milk.  2 hours before the procedure - stop drinking clear liquids.  Medicines  Ask your health care provider about: ? Changing or stopping your regular medicines. This is especially important if you are taking diabetes medicines or blood thinners. ? Taking medicines such as aspirin and ibuprofen. These medicines can thin your blood. Do not take these medicines before your procedure if your health care provider instructs you not to. ? Taking new dietary supplements or  medicines. Do not take these during the week before your procedure unless your health care provider approves them.  If you are told to take a medicine or to continue taking a medicine on the day of the procedure, take the medicine with sips of water. General instructions   Ask if you will be going home the same day, the following day, or after a longer hospital stay. ? Plan to have  someone take you home. ? Plan to have someone stay with you for the first 24 hours after you leave the hospital or clinic.  For 3-6 weeks before the procedure, try not to use any tobacco products, such as cigarettes, chewing tobacco, and e-cigarettes.  You may brush your teeth on the morning of the procedure, but make sure to spit out the toothpaste. What happens during the procedure?  You will be given anesthetics through a mask and through an IV tube in one of your veins.  You may receive medicine to help you relax (sedative).  As soon as you are asleep, a breathing tube may be used to help you breathe.  An anesthesia specialist will stay with you throughout the procedure. He or she will help keep you comfortable and safe by continuing to give you medicines and adjusting the amount of medicine that you get. He or she will also watch your blood pressure, pulse, and oxygen levels to make sure that the anesthetics do not cause any problems.  If a breathing tube was used to help you breathe, it will be removed before you wake up. The procedure may vary among health care providers and hospitals. What happens after the procedure?  You will wake up, often slowly, after the procedure is complete, usually in a recovery area.  Your blood pressure, heart rate, breathing rate, and blood oxygen level will be monitored until the medicines you were given have worn off.  You may be given medicine to help you calm down if you feel anxious or agitated.  If you will be going home the same day, your health care provider may  check to make sure you can stand, drink, and urinate.  Your health care providers will treat your pain and side effects before you go home.  Do not drive for 24 hours if you received a sedative.  You may: ? Feel nauseous and vomit. ? Have a sore throat. ? Have mental slowness. ? Feel cold or shivery. ? Feel sleepy. ? Feel tired. ? Feel sore or achy, even in parts of your body where you did not have surgery. This information is not intended to replace advice given to you by your health care provider. Make sure you discuss any questions you have with your health care provider. Document Released: 02/15/2008 Document Revised: 04/20/2016 Document Reviewed: 10/23/2015 Elsevier Interactive Patient Education  2018 [place] Anesthesia, Adult, Care After These instructions provide you with information about caring for yourself after your procedure. Your health care provider may also give you more specific instructions. Your treatment has been planned according to current medical practices, but problems sometimes occur. Call your health care provider if you have any problems or questions after your procedure. What can I expect after the procedure? After the procedure, it is common to have:  Vomiting.  A sore throat.  Mental slowness.  It is common to feel:  Nauseous.  Cold or shivery.  Sleepy.  Tired.  Sore or achy, even in parts of your body where you did not have surgery.  Follow these instructions at home: For at least 24 hours after the procedure:  Do not: ? Participate in activities where you could fall or become injured. ? Drive. ? Use heavy machinery. ? Drink alcohol. ? Take sleeping pills or medicines that cause drowsiness. ? Make important decisions or sign legal documents. ? Take care of children on your own.  Rest. Eating and drinking  If you vomit, drink water,  juice, or soup when you can drink without vomiting.  Drink enough fluid to keep your  urine clear or pale yellow.  Make sure you have little or no nausea before eating solid foods.  Follow the diet recommended by your health care provider. General instructions  Have a responsible adult stay with you until you are awake and alert.  Return to your normal activities as told by your health care provider. Ask your health care provider what activities are safe for you.  Take over-the-counter and prescription medicines only as told by your health care provider.  If you smoke, do not smoke without supervision.  Keep all follow-up visits as told by your health care provider. This is important. Contact a health care provider if:  You continue to have nausea or vomiting at home, and medicines are not helpful.  You cannot drink fluids or start eating again.  You cannot urinate after 8-12 hours.  You develop a skin rash.  You have fever.  You have increasing redness at the site of your procedure. Get help right away if:  You have difficulty breathing.  You have chest pain.  You have unexpected bleeding.  You feel that you are having a life-threatening or urgent problem. This information is not intended to replace advice given to you by your health care provider. Make sure you discuss any questions you have with your health care provider. Document Released: 02/14/2001 Document Revised: 04/12/2016 Document Reviewed: 10/23/2015 Elsevier Interactive Patient Education  [employer].

## 2017-11-03 NOTE — H&P (Signed)
Ronald Conner; [id number]; 08/14/1961   HPI Patient is a 56 year old white male who was referred to my care by Dr. Laural Golden for evaluation and treatment of upper abdominal pain.  Patient states that over the last 2 months, he has had intermittent episodes of epigastric bloating and right upper quadrant abdominal tightness.  This is made worse with fatty foods.  At this point, he is only drinking liquids as he seems to have attacks daily.  He does have nausea, but no emesis.  He denies any fever, chills, or jaundice.  His pain level is 5 out of 10 when these attacks occur.  He has had multiple ER visits for epigastric tightness.  Cardiac workup has been negative.  Ultrasound gallbladder is negative.  HIDA scan reveals a borderline gallbladder ejection fraction at 30% with reproducible symptoms with a fatty meal. Past Medical History:  Diagnosis Date  . Acid reflux   . GERD (gastroesophageal reflux disease) 10/03/2017  . Hypertension     Past Surgical History:  Procedure Laterality Date  . ESOPHAGOGASTRODUODENOSCOPY N/A 10/07/2017   Procedure: ESOPHAGOGASTRODUODENOSCOPY (EGD);  Surgeon: Rogene Houston, MD;  Location: [facility];  Service: Endoscopy;  Laterality: N/A;  12:00pm  . NO PAST SURGERIES      Family History  Problem Relation Age of Onset  . Arrhythmia Mother        h/o DCCV  . Leukemia Mother   . Hypertension Mother   . Cataracts Mother   . Peripheral Artery Disease Mother   . Heart attack Father 7  . Kidney failure Brother        on dialysis  . Hypertension Brother   . Arrhythmia Maternal Grandmother   . Heart attack Maternal Grandmother   . Stroke Maternal Grandfather   . Peripheral Artery Disease Maternal Grandfather        double amputee  . Dementia Paternal Grandmother   . Diabetes Brother   . Obesity Brother     Current Outpatient Medications on File Prior to Visit  Medication Sig Dispense Refill  . dicyclomine (BENTYL) 10 MG capsule Take 10 mg by mouth 4  (four) times daily.    Marland Kitchen HYDROcodone-acetaminophen (NORCO/VICODIN) 5-325 MG tablet Take 1 tablet by mouth every 6 (six) hours as needed for moderate pain. 20 tablet 0  . lisinopril (PRINIVIL,ZESTRIL) 20 MG tablet Take 1 tablet (20 mg total) by mouth daily. 90 tablet 3  . ondansetron (ZOFRAN ODT) 4 MG disintegrating tablet Take 1 tablet (4 mg total) every 8 (eight) hours as needed by mouth for nausea or vomiting. (Patient not taking: Reported on 10/27/2017) 20 tablet 0  . pantoprazole (PROTONIX) 40 MG tablet Take 1 tablet (40 mg total) daily by mouth. 30 minutes before breakfast 90 tablet 3  . polyethylene glycol (MIRALAX) packet Take 17 g by mouth daily. 14 each 0  . Simethicone (GAS-X PO) Take 1 capsule by mouth once as needed.    . sucralfate (CARAFATE) 1 g tablet Take 1 tablet (1 g total) 4 (four) times daily -  with meals and at bedtime by mouth. 30 tablet 0   No current facility-administered medications on file prior to visit.     No Known Allergies  Social History   Substance and Sexual Activity  Alcohol Use No    Social History   Tobacco Use  Smoking Status Current Every Day Smoker  . Types: Cigars  Smokeless Tobacco Current User  . Types: Snuff  Tobacco Comment   dips snuff for  25 years.cigar once or twicea year    Review of Systems  Constitutional: Negative.   HENT: Positive for sinus pain.   Eyes: Positive for blurred vision.  Respiratory: Positive for cough, shortness of breath and wheezing.   Cardiovascular: Negative.   Gastrointestinal: Positive for abdominal pain, heartburn and nausea.  Genitourinary: Positive for frequency.  Musculoskeletal: Positive for back pain, joint pain and neck pain.  Skin: Negative.   Neurological: Positive for dizziness.  Psychiatric/Behavioral: Negative.     Objective   Vitals:   11/03/17 0905  BP: 110/71  Pulse: (!) 54  Temp: 97.7 F (36.5 C)    Physical Exam  Constitutional: He is oriented to person, place, and time  and well-developed, well-nourished, and in no distress.  HENT:  Head: Normocephalic and atraumatic.  Eyes: No scleral icterus.  Cardiovascular: Normal rate, regular rhythm and normal heart sounds. Exam reveals no gallop and no friction rub.  No murmur heard. Pulmonary/Chest: Effort normal and breath sounds normal. No respiratory distress. He has no wheezes. He has no rales.  Abdominal: Soft. Bowel sounds are normal. He exhibits no distension. There is no tenderness. There is no rebound.  Neurological: He is alert and oriented to person, place, and time.  Skin: Skin is warm and dry.  Vitals reviewed. GI notes reviewed.  HIDA scan and U/S reports reviewed.  Assessment  Chronic cholecystitis Plan   Patient is scheduled for laparoscopic cholecystectomy on 11/07/2017.  The risks and benefits of the procedure including bleeding, infection, hepatobiliary injury, the possibility of operative procedure were fully explained to the patient, who gave informed consent.  I did tell the patient that this may not cure all his symptoms.  He understands this and agrees to proceed with the surgery.

## 2017-11-03 NOTE — Progress Notes (Addendum)
Ronald Conner; [id number]; 1961-03-23   HPI Patient is a 56 year old white male who was referred to my care by Dr. Laural Golden for evaluation and treatment of upper abdominal pain.  Patient states that over the last 2 months, he has had intermittent episodes of epigastric bloating and right upper quadrant abdominal tightness.  This is made worse with fatty foods.  At this point, he is only drinking liquids as he seems to have attacks daily.  He does have nausea, but no emesis.  He denies any fever, chills, or jaundice.  His pain level is 5 out of 10 when these attacks occur.  He has had multiple ER visits for epigastric tightness.  Cardiac workup has been negative.  Ultrasound gallbladder is negative.  HIDA scan reveals a borderline gallbladder ejection fraction at 30% with reproducible symptoms with a fatty meal. Past Medical History:  Diagnosis Date  . Acid reflux   . GERD (gastroesophageal reflux disease) 10/03/2017  . Hypertension     Past Surgical History:  Procedure Laterality Date  . ESOPHAGOGASTRODUODENOSCOPY N/A 10/07/2017   Procedure: ESOPHAGOGASTRODUODENOSCOPY (EGD);  Surgeon: Rogene Houston, MD;  Location: [facility];  Service: Endoscopy;  Laterality: N/A;  12:00pm  . NO PAST SURGERIES      Family History  Problem Relation Age of Onset  . Arrhythmia Mother        h/o DCCV  . Leukemia Mother   . Hypertension Mother   . Cataracts Mother   . Peripheral Artery Disease Mother   . Heart attack Father 12  . Kidney failure Brother        on dialysis  . Hypertension Brother   . Arrhythmia Maternal Grandmother   . Heart attack Maternal Grandmother   . Stroke Maternal Grandfather   . Peripheral Artery Disease Maternal Grandfather        double amputee  . Dementia Paternal Grandmother   . Diabetes Brother   . Obesity Brother     Current Outpatient Medications on File Prior to Visit  Medication Sig Dispense Refill  . dicyclomine (BENTYL) 10 MG capsule Take 10 mg by mouth 4  (four) times daily.    Marland Kitchen HYDROcodone-acetaminophen (NORCO/VICODIN) 5-325 MG tablet Take 1 tablet by mouth every 6 (six) hours as needed for moderate pain. 20 tablet 0  . lisinopril (PRINIVIL,ZESTRIL) 20 MG tablet Take 1 tablet (20 mg total) by mouth daily. 90 tablet 3  . ondansetron (ZOFRAN ODT) 4 MG disintegrating tablet Take 1 tablet (4 mg total) every 8 (eight) hours as needed by mouth for nausea or vomiting. (Patient not taking: Reported on 10/27/2017) 20 tablet 0  . pantoprazole (PROTONIX) 40 MG tablet Take 1 tablet (40 mg total) daily by mouth. 30 minutes before breakfast 90 tablet 3  . polyethylene glycol (MIRALAX) packet Take 17 g by mouth daily. 14 each 0  . Simethicone (GAS-X PO) Take 1 capsule by mouth once as needed.    . sucralfate (CARAFATE) 1 g tablet Take 1 tablet (1 g total) 4 (four) times daily -  with meals and at bedtime by mouth. 30 tablet 0   No current facility-administered medications on file prior to visit.     No Known Allergies  Social History   Substance and Sexual Activity  Alcohol Use No    Social History   Tobacco Use  Smoking Status Current Every Day Smoker  . Types: Cigars  Smokeless Tobacco Current User  . Types: Snuff  Tobacco Comment   dips snuff for  25 years.cigar once or twicea year    Review of Systems  Constitutional: Negative.   HENT: Positive for sinus pain.   Eyes: Positive for blurred vision.  Respiratory: Positive for cough, shortness of breath and wheezing.   Cardiovascular: Negative.   Gastrointestinal: Positive for abdominal pain, heartburn and nausea.  Genitourinary: Positive for frequency.  Musculoskeletal: Positive for back pain, joint pain and neck pain.  Skin: Negative.   Neurological: Positive for dizziness.  Psychiatric/Behavioral: Negative.     Objective   Vitals:   11/03/17 0905  BP: 110/71  Pulse: (!) 54  Temp: 97.7 F (36.5 C)    Physical Exam  Constitutional: He is oriented to person, place, and time  and well-developed, well-nourished, and in no distress.  HENT:  Head: Normocephalic and atraumatic.  Eyes: No scleral icterus.  Cardiovascular: Normal rate, regular rhythm and normal heart sounds. Exam reveals no gallop and no friction rub.  No murmur heard. Pulmonary/Chest: Effort normal and breath sounds normal. No respiratory distress. He has no wheezes. He has no rales.  Abdominal: Soft. Bowel sounds are normal. He exhibits no distension. There is no tenderness. There is no rebound.  Neurological: He is alert and oriented to person, place, and time.  Skin: Skin is warm and dry.  Vitals reviewed. GI notes reviewed.  HIDA scan and U/S reports reviewed.  Assessment  Chronic cholecystitis Plan   Patient is scheduled for laparoscopic cholecystectomy on 11/07/2017.  The risks and benefits of the procedure including bleeding, infection, hepatobiliary injury, the possibility of operative procedure were fully explained to the patient, who gave informed consent.  I did tell the patient that this may not cure all his symptoms.  He understands this and agrees to proceed with the surgery.

## 2017-11-03 NOTE — Patient Instructions (Signed)

## 2017-11-04 ENCOUNTER — Encounter (HOSPITAL_COMMUNITY): Payer: Self-pay

## 2017-11-04 ENCOUNTER — Encounter (HOSPITAL_COMMUNITY)
Admission: RE | Admit: 2017-11-04 | Discharge: 2017-11-04 | Disposition: A | Discharge status: Home / Self Care | Payer: Medicare Other | Source: Ambulatory Visit | Attending: General Surgery | Admitting: General Surgery

## 2017-11-04 ENCOUNTER — Other Ambulatory Visit: Payer: Self-pay

## 2017-11-04 DIAGNOSIS — Z833 Family history of diabetes mellitus: Secondary | ICD-10-CM | POA: Diagnosis not present

## 2017-11-04 DIAGNOSIS — Z79899 Other long term (current) drug therapy: Secondary | ICD-10-CM | POA: Diagnosis not present

## 2017-11-04 DIAGNOSIS — Z820 Family history of epilepsy and other diseases of the nervous system: Secondary | ICD-10-CM | POA: Diagnosis not present

## 2017-11-04 DIAGNOSIS — K219 Gastro-esophageal reflux disease without esophagitis: Secondary | ICD-10-CM | POA: Diagnosis not present

## 2017-11-04 DIAGNOSIS — I10 Essential (primary) hypertension: Secondary | ICD-10-CM | POA: Diagnosis not present

## 2017-11-04 DIAGNOSIS — Z8489 Family history of other specified conditions: Secondary | ICD-10-CM | POA: Diagnosis not present

## 2017-11-04 DIAGNOSIS — K811 Chronic cholecystitis: Secondary | ICD-10-CM | POA: Diagnosis not present

## 2017-11-04 DIAGNOSIS — Z8249 Family history of ischemic heart disease and other diseases of the circulatory system: Secondary | ICD-10-CM | POA: Diagnosis not present

## 2017-11-04 DIAGNOSIS — Z823 Family history of stroke: Secondary | ICD-10-CM | POA: Diagnosis not present

## 2017-11-04 DIAGNOSIS — Z806 Family history of leukemia: Secondary | ICD-10-CM | POA: Diagnosis not present

## 2017-11-04 DIAGNOSIS — F17290 Nicotine dependence, other tobacco product, uncomplicated: Secondary | ICD-10-CM | POA: Diagnosis not present

## 2017-11-04 DIAGNOSIS — Z841 Family history of disorders of kidney and ureter: Secondary | ICD-10-CM | POA: Diagnosis not present

## 2017-11-04 HISTORY — DX: Family history of other specified conditions: Z84.89

## 2017-11-07 ENCOUNTER — Ambulatory Visit (HOSPITAL_COMMUNITY)
Admission: RE | Admit: 2017-11-07 | Discharge: 2017-11-07 | Disposition: A | Discharge status: Home / Self Care | Payer: Medicare Other | Source: Ambulatory Visit | Attending: General Surgery | Admitting: General Surgery

## 2017-11-07 ENCOUNTER — Ambulatory Visit: Payer: Self-pay

## 2017-11-07 ENCOUNTER — Ambulatory Visit (HOSPITAL_COMMUNITY): Payer: Medicare Other | Admitting: Anesthesiology

## 2017-11-07 ENCOUNTER — Encounter (HOSPITAL_COMMUNITY): Payer: Self-pay | Admitting: Anesthesiology

## 2017-11-07 ENCOUNTER — Encounter (HOSPITAL_COMMUNITY)
Admission: RE | Disposition: A | Discharge status: Home / Self Care | Payer: Self-pay | Source: Ambulatory Visit | Attending: General Surgery

## 2017-11-07 ENCOUNTER — Encounter (INDEPENDENT_AMBULATORY_CARE_PROVIDER_SITE_OTHER): Payer: Self-pay | Admitting: *Deleted

## 2017-11-07 DIAGNOSIS — Z8249 Family history of ischemic heart disease and other diseases of the circulatory system: Secondary | ICD-10-CM | POA: Insufficient documentation

## 2017-11-07 DIAGNOSIS — K219 Gastro-esophageal reflux disease without esophagitis: Secondary | ICD-10-CM | POA: Diagnosis not present

## 2017-11-07 DIAGNOSIS — Z841 Family history of disorders of kidney and ureter: Secondary | ICD-10-CM | POA: Insufficient documentation

## 2017-11-07 DIAGNOSIS — I10 Essential (primary) hypertension: Secondary | ICD-10-CM | POA: Diagnosis not present

## 2017-11-07 DIAGNOSIS — K811 Chronic cholecystitis: Secondary | ICD-10-CM | POA: Insufficient documentation

## 2017-11-07 DIAGNOSIS — Z8489 Family history of other specified conditions: Secondary | ICD-10-CM | POA: Diagnosis not present

## 2017-11-07 DIAGNOSIS — Z806 Family history of leukemia: Secondary | ICD-10-CM | POA: Insufficient documentation

## 2017-11-07 DIAGNOSIS — Z833 Family history of diabetes mellitus: Secondary | ICD-10-CM | POA: Insufficient documentation

## 2017-11-07 DIAGNOSIS — Z823 Family history of stroke: Secondary | ICD-10-CM | POA: Diagnosis not present

## 2017-11-07 DIAGNOSIS — Z820 Family history of epilepsy and other diseases of the nervous system: Secondary | ICD-10-CM | POA: Diagnosis not present

## 2017-11-07 DIAGNOSIS — Z79899 Other long term (current) drug therapy: Secondary | ICD-10-CM | POA: Diagnosis not present

## 2017-11-07 DIAGNOSIS — F17290 Nicotine dependence, other tobacco product, uncomplicated: Secondary | ICD-10-CM | POA: Insufficient documentation

## 2017-11-07 HISTORY — PX: GALLBLADDER SURGERY: SHX652

## 2017-11-07 HISTORY — PX: CHOLECYSTECTOMY: SHX55

## 2017-11-07 SURGERY — LAPAROSCOPIC CHOLECYSTECTOMY
Anesthesia: General

## 2017-11-07 MED ORDER — MIDAZOLAM HCL 2 MG/2ML IJ SOLN
1.0000 mg | INTRAMUSCULAR | Status: AC
  Administered 2017-11-07: 2 mg via INTRAVENOUS

## 2017-11-07 MED ORDER — ONDANSETRON HCL 4 MG/2ML IJ SOLN
INTRAMUSCULAR | Status: AC
  Filled 2017-11-07: qty 2

## 2017-11-07 MED ORDER — ATROPINE SULFATE 0.4 MG/ML IJ SOLN
INTRAMUSCULAR | Status: DC | PRN
  Administered 2017-11-07: 0.4 mg via INTRAVENOUS

## 2017-11-07 MED ORDER — BUPIVACAINE HCL (PF) 0.5 % IJ SOLN
INTRAMUSCULAR | Status: DC | PRN
  Administered 2017-11-07: 10 mL

## 2017-11-07 MED ORDER — PROPOFOL 10 MG/ML IV BOLUS
INTRAVENOUS | Status: AC
  Filled 2017-11-07: qty 40

## 2017-11-07 MED ORDER — ONDANSETRON HCL 4 MG/2ML IJ SOLN
4.0000 mg | Freq: Once | INTRAMUSCULAR | Status: AC
  Administered 2017-11-07: 4 mg via INTRAVENOUS

## 2017-11-07 MED ORDER — ROCURONIUM BROMIDE 100 MG/10ML IV SOLN
INTRAVENOUS | Status: DC | PRN
  Administered 2017-11-07: 40 mg via INTRAVENOUS

## 2017-11-07 MED ORDER — SODIUM CHLORIDE 0.9 % IR SOLN
Status: DC | PRN
  Administered 2017-11-07: 1000 mL

## 2017-11-07 MED ORDER — MIDAZOLAM HCL 2 MG/2ML IJ SOLN
INTRAMUSCULAR | Status: AC
  Filled 2017-11-07: qty 2

## 2017-11-07 MED ORDER — FENTANYL CITRATE (PF) 100 MCG/2ML IJ SOLN
INTRAMUSCULAR | Status: DC | PRN
Start: 2017-11-07 — End: 2017-11-07
  Administered 2017-11-07 (×5): 50 ug via INTRAVENOUS

## 2017-11-07 MED ORDER — BUPIVACAINE HCL (PF) 0.5 % IJ SOLN
INTRAMUSCULAR | Status: AC
  Filled 2017-11-07: qty 30

## 2017-11-07 MED ORDER — SUGAMMADEX SODIUM 500 MG/5ML IV SOLN
INTRAVENOUS | Status: DC | PRN
  Administered 2017-11-07: 192.4 mg via INTRAVENOUS

## 2017-11-07 MED ORDER — ROCURONIUM BROMIDE 50 MG/5ML IV SOLN
INTRAVENOUS | Status: AC
  Filled 2017-11-07: qty 1

## 2017-11-07 MED ORDER — FENTANYL CITRATE (PF) 100 MCG/2ML IJ SOLN
25.0000 ug | INTRAMUSCULAR | Status: DC | PRN
  Administered 2017-11-07 (×2): 50 ug via INTRAVENOUS
  Filled 2017-11-07: qty 2

## 2017-11-07 MED ORDER — EPHEDRINE SULFATE 50 MG/ML IJ SOLN
INTRAMUSCULAR | Status: DC | PRN
  Administered 2017-11-07: 10 mg via INTRAVENOUS

## 2017-11-07 MED ORDER — CHLORHEXIDINE GLUCONATE CLOTH 2 % EX PADS: 6.0000 | MEDICATED_PAD | Freq: Once | CUTANEOUS | Status: DC

## 2017-11-07 MED ORDER — GLYCOPYRROLATE 0.2 MG/ML IJ SOLN
INTRAMUSCULAR | Status: AC
  Filled 2017-11-07: qty 1

## 2017-11-07 MED ORDER — SUCCINYLCHOLINE CHLORIDE 20 MG/ML IJ SOLN
INTRAMUSCULAR | Status: AC
  Filled 2017-11-07: qty 1

## 2017-11-07 MED ORDER — HYDROCODONE-ACETAMINOPHEN 5-325 MG PO TABS: 1.0000 | ORAL_TABLET | Freq: Four times a day (QID) | ORAL | 0 refills | Status: AC | PRN

## 2017-11-07 MED ORDER — HEMOSTATIC AGENTS (NO CHARGE) OPTIME
TOPICAL | Status: DC | PRN
  Administered 2017-11-07: 1 via TOPICAL

## 2017-11-07 MED ORDER — FENTANYL CITRATE (PF) 250 MCG/5ML IJ SOLN
INTRAMUSCULAR | Status: AC
  Filled 2017-11-07: qty 5

## 2017-11-07 MED ORDER — EPHEDRINE SULFATE 50 MG/ML IJ SOLN
INTRAMUSCULAR | Status: AC
  Filled 2017-11-07: qty 1

## 2017-11-07 MED ORDER — LACTATED RINGERS IV SOLN
INTRAVENOUS | Status: DC
  Administered 2017-11-07 (×2): via INTRAVENOUS

## 2017-11-07 MED ORDER — DEXAMETHASONE SODIUM PHOSPHATE 4 MG/ML IJ SOLN
INTRAMUSCULAR | Status: AC
  Filled 2017-11-07: qty 1

## 2017-11-07 MED ORDER — LIDOCAINE HCL (CARDIAC) 20 MG/ML IV SOLN
INTRAVENOUS | Status: DC | PRN
  Administered 2017-11-07: 30 mg via INTRAVENOUS

## 2017-11-07 MED ORDER — KETOROLAC TROMETHAMINE 30 MG/ML IJ SOLN
30.0000 mg | Freq: Once | INTRAMUSCULAR | Status: AC
  Administered 2017-11-07: 30 mg via INTRAVENOUS
  Filled 2017-11-07: qty 1

## 2017-11-07 MED ORDER — PHENYLEPHRINE 40 MCG/ML (10ML) SYRINGE FOR IV PUSH (FOR BLOOD PRESSURE SUPPORT)
PREFILLED_SYRINGE | INTRAVENOUS | Status: AC
  Filled 2017-11-07: qty 10

## 2017-11-07 MED ORDER — GLYCOPYRROLATE 0.2 MG/ML IJ SOLN
INTRAMUSCULAR | Status: DC | PRN
  Administered 2017-11-07: 0.2 mg via INTRAVENOUS

## 2017-11-07 MED ORDER — PROPOFOL 10 MG/ML IV BOLUS
INTRAVENOUS | Status: DC | PRN
  Administered 2017-11-07: 150 mg via INTRAVENOUS
  Administered 2017-11-07: 50 mg via INTRAVENOUS

## 2017-11-07 MED ORDER — DEXAMETHASONE SODIUM PHOSPHATE 4 MG/ML IJ SOLN
4.0000 mg | Freq: Once | INTRAMUSCULAR | Status: AC
  Administered 2017-11-07: 4 mg via INTRAVENOUS

## 2017-11-07 MED ORDER — LIDOCAINE HCL (PF) 1 % IJ SOLN
INTRAMUSCULAR | Status: AC
  Filled 2017-11-07: qty 5

## 2017-11-07 MED ORDER — POVIDONE-IODINE 10 % EX OINT
TOPICAL_OINTMENT | CUTANEOUS | Status: AC
  Filled 2017-11-07: qty 1

## 2017-11-07 MED ORDER — SODIUM CHLORIDE 0.9 % IJ SOLN
INTRAMUSCULAR | Status: AC
  Filled 2017-11-07: qty 10

## 2017-11-07 MED ORDER — SUGAMMADEX SODIUM 500 MG/5ML IV SOLN
INTRAVENOUS | Status: AC
  Filled 2017-11-07: qty 5

## 2017-11-07 MED ORDER — CIPROFLOXACIN IN D5W 400 MG/200ML IV SOLN
400.0000 mg | INTRAVENOUS | Status: AC
  Administered 2017-11-07: 400 mg via INTRAVENOUS
  Filled 2017-11-07: qty 200

## 2017-11-07 SURGICAL SUPPLY — 48 items
ADH SKN CLS APL DERMABOND .7 (GAUZE/BANDAGES/DRESSINGS) ×1
APPLIER CLIP ROT 10 11.4 M/L (STAPLE) ×2
APR CLP MED LRG 11.4X10 (STAPLE) ×1
BAG HAMPER (MISCELLANEOUS) ×2 IMPLANT
BAG RETRIEVAL 10 (BASKET) ×1
CHLORAPREP W/TINT 26ML (MISCELLANEOUS) ×2 IMPLANT
CLIP APPLIE ROT 10 11.4 M/L (STAPLE) ×1 IMPLANT
CLOTH BEACON ORANGE TIMEOUT ST (SAFETY) ×2 IMPLANT
COVER LIGHT HANDLE STERIS (MISCELLANEOUS) ×4 IMPLANT
DECANTER SPIKE VIAL GLASS SM (MISCELLANEOUS) ×2 IMPLANT
DERMABOND ADVANCED (GAUZE/BANDAGES/DRESSINGS) ×1
DERMABOND ADVANCED .7 DNX12 (GAUZE/BANDAGES/DRESSINGS) IMPLANT
ELECT REM PT RETURN 9FT ADLT (ELECTROSURGICAL) ×2
ELECTRODE REM PT RTRN 9FT ADLT (ELECTROSURGICAL) ×1 IMPLANT
FILTER SMOKE EVAC LAPAROSHD (FILTER) ×2 IMPLANT
GLOVE BIOGEL PI IND STRL 6.5 (GLOVE) IMPLANT
GLOVE BIOGEL PI IND STRL 7.0 (GLOVE) ×1 IMPLANT
GLOVE BIOGEL PI INDICATOR 6.5 (GLOVE) ×2
GLOVE BIOGEL PI INDICATOR 7.0 (GLOVE) ×2
GLOVE ECLIPSE 6.5 STRL STRAW (GLOVE) ×2 IMPLANT
GLOVE SURG SS PI 7.5 STRL IVOR (GLOVE) ×2 IMPLANT
GOWN STRL REUS W/ TWL XL LVL3 (GOWN DISPOSABLE) ×1 IMPLANT
GOWN STRL REUS W/TWL LRG LVL3 (GOWN DISPOSABLE) ×4 IMPLANT
GOWN STRL REUS W/TWL XL LVL3 (GOWN DISPOSABLE) ×2
HEMOSTAT SNOW SURGICEL 2X4 (HEMOSTASIS) ×2 IMPLANT
INST SET LAPROSCOPIC AP (KITS) ×2 IMPLANT
IV NS IRRIG 3000ML ARTHROMATIC (IV SOLUTION) IMPLANT
KIT ROOM TURNOVER APOR (KITS) ×2 IMPLANT
MANIFOLD NEPTUNE II (INSTRUMENTS) ×2 IMPLANT
NDL INSUFFLATION 14GA 120MM (NEEDLE) ×1 IMPLANT
NEEDLE INSUFFLATION 14GA 120MM (NEEDLE) ×2 IMPLANT
NS IRRIG 1000ML POUR BTL (IV SOLUTION) ×2 IMPLANT
PACK LAP CHOLE LZT030E (CUSTOM PROCEDURE TRAY) ×2 IMPLANT
PAD ARMBOARD 7.5X6 YLW CONV (MISCELLANEOUS) ×2 IMPLANT
SET BASIN LINEN APH (SET/KITS/TRAYS/PACK) ×2 IMPLANT
SET TUBE IRRIG SUCTION NO TIP (IRRIGATION / IRRIGATOR) IMPLANT
SLEEVE ENDOPATH XCEL 5M (ENDOMECHANICALS) ×2 IMPLANT
SPONGE GAUZE 2X2 8PLY STRL LF (GAUZE/BANDAGES/DRESSINGS) ×8 IMPLANT
SUT MNCRL AB 4-0 PS2 18 (SUTURE) ×2 IMPLANT
SUT VICRYL 0 UR6 27IN ABS (SUTURE) ×2 IMPLANT
SYS BAG RETRIEVAL 10MM (BASKET) ×1
SYSTEM BAG RETRIEVAL 10MM (BASKET) ×1 IMPLANT
TROCAR ENDO BLADELESS 11MM (ENDOMECHANICALS) ×2 IMPLANT
TROCAR XCEL NON-BLD 5MMX100MML (ENDOMECHANICALS) ×2 IMPLANT
TROCAR XCEL UNIV SLVE 11M 100M (ENDOMECHANICALS) ×2 IMPLANT
TUBE CONNECTING 12X1/4 (SUCTIONS) ×2 IMPLANT
TUBING INSUFFLATION (TUBING) ×2 IMPLANT
WARMER LAPAROSCOPE (MISCELLANEOUS) ×2 IMPLANT

## 2017-11-07 NOTE — Anesthesia Procedure Notes (Signed)
Procedure Name: Intubation Date/Time: 11/07/2017 10:23 AM Performed by: Andree Elk, Tarika Mckethan A, CRNA Pre-anesthesia Checklist: Patient identified, Patient being monitored, Timeout performed, Emergency Drugs available and Suction available Patient Re-evaluated:Patient Re-evaluated prior to induction Oxygen Delivery Method: Circle System Utilized Preoxygenation: Pre-oxygenation with 100% oxygen Induction Type: IV induction Ventilation: Mask ventilation without difficulty Laryngoscope Size: Mac and 3 Grade View: Grade II Tube type: Oral Tube size: 7.0 mm Number of attempts: 1 Airway Equipment and Method: Stylet Placement Confirmation: ETT inserted through vocal cords under direct vision,  positive ETCO2 and breath sounds checked- equal and bilateral Secured at: 21 cm Tube secured with: Tape Dental Injury: Teeth and Oropharynx as per pre-operative assessment

## 2017-11-07 NOTE — Anesthesia Preprocedure Evaluation (Signed)
Anesthesia Evaluation  Patient identified by MRN, date of birth, ID band Patient awake    Reviewed: Allergy & Precautions, NPO status , Patient's Chart, lab work & pertinent test results, reviewed documented beta blocker date and time   Airway Mallampati: III  TM Distance: >3 FB Neck ROM: Full    Dental  (+) Edentulous Upper, Edentulous Lower   Pulmonary former smoker,    breath sounds clear to auscultation       Cardiovascular hypertension, Pt. on medications and Pt. on home beta blockers  Rhythm:Regular Rate:Normal     Neuro/Psych negative neurological ROS  negative psych ROS   GI/Hepatic GERD  Medicated and Poorly Controlled,  Endo/Other    Renal/GU      Musculoskeletal   Abdominal   Peds  Hematology   Anesthesia Other Findings   Reproductive/Obstetrics                             Anesthesia Physical Anesthesia Plan  ASA: II  Anesthesia Plan: General   Post-op Pain Management:    Induction: Intravenous, Rapid sequence and Cricoid pressure planned  PONV Risk Score and Plan:   Airway Management Planned: Oral ETT  Additional Equipment:   Intra-op Plan:   Post-operative Plan: Extubation in OR  Informed Consent: I have reviewed the patients History and Physical, chart, labs and discussed the procedure including the risks, benefits and alternatives for the proposed anesthesia with the patient or authorized representative who has indicated his/her understanding and acceptance.     Plan Discussed with:   Anesthesia Plan Comments:         Anesthesia Quick Evaluation

## 2017-11-07 NOTE — Anesthesia Postprocedure Evaluation (Signed)
Anesthesia Post Note  Patient: Ronald Conner  Procedure(s) Performed: LAPAROSCOPIC CHOLECYSTECTOMY (N/A )  Patient location during evaluation: PACU Anesthesia Type: General Level of consciousness: awake and alert, oriented and patient cooperative Pain management: pain level controlled Vital Signs Assessment: post-procedure vital signs reviewed and stable Respiratory status: spontaneous breathing and respiratory function stable Cardiovascular status: stable Postop Assessment: no apparent nausea or vomiting Anesthetic complications: no     Last Vitals:  Vitals:   11/07/17 1124 11/07/17 1130  BP: (!) 154/98 137/89  Pulse: 83 80  Resp: 15 16  Temp: 36.5 C   SpO2: 93% 96%    Last Pain:  Vitals:   11/07/17 1124  TempSrc:   PainSc: 8                  ADAMS, AMY A

## 2017-11-07 NOTE — Op Note (Signed)
Patient:  Ronald Conner  DOB:  [birth year]  MRN:  [id number]   Preop Diagnosis: Chronic cholecystitis  Postop Diagnosis: Same  Procedure: Laparoscopic cholecystectomy  Surgeon: Aviva Signs, MD  Anes: General endotracheal  Indications: Patient is a 56 year old white male who presents with biliary colic secondary to chronic cholecystitis.  The risks and benefits of the procedure including bleeding, infection, hepatobiliary injury, and the possibility of an open procedure were fully explained to the patient, who gave informed consent.  Procedure note: Patient was placed in supine position.  After induction of general endotracheal anesthesia, the abdomen was prepped and draped using the usual sterile technique with DuraPrep.  Surgical site confirmation was performed.  A supraumbilical incision was made down to the fascia.  A Veress needle was introduced into the abdominal cavity and confirmation of placement was done using saline drop test.  The abdomen was then insufflated to 16 mmHg pressure.  An 11 mm trocar was introduced into the abdominal cavity under direct visualization without difficulty.  The patient was placed in reverse Trendelenburg position and an additional 11 mm trocar was placed in the epigastric region and 5 mm trochars were placed in the right upper quadrant and right flank regions.  Liver was inspected and noted to be within normal limits.  The gallbladder was retracted in a dynamic fashion in order to provide a critical view of the triangle of Calot.  The cystic duct was first identified.  Its juncture to the infundibulum was fully identified.  Endoclips were placed proximally and distally on the cystic duct, and the cystic duct was divided.  This was likewise done to the cystic artery.  The gallbladder was then freed away from the gallbladder fossa using Bovie electrocautery.  The gallbladder was delivered through the epigastric trocar site using an Endo Catch bag.  The  gallbladder fossa was inspected and no abnormal bleeding or bile leakage was noted.  Surgicel was placed in the gallbladder fossa.  All fluid and air were then evacuated from the abdominal cavity prior to the removal of the trochars.  All wounds were irrigated with normal saline.  All wounds were injected with 0.5% Sensorcaine.  The supraumbilical fascia as well as epigastric fascia were reapproximated using 0 Vicryl interrupted sutures.  All skin incisions were closed using staples.  Betadine ointment and dry sterile dressings were applied.  All tape needle counts were correct at the end of the procedure.  The patient was extubated in the operating room and transferred to PACU in stable condition.  Complications: None  EBL: Minimal  Specimen: Gallbladder

## 2017-11-07 NOTE — Transfer of Care (Signed)
Immediate Anesthesia Transfer of Care Note  Patient: Ronald Conner  Procedure(s) Performed: LAPAROSCOPIC CHOLECYSTECTOMY (N/A )  Patient Location: PACU  Anesthesia Type:General  Level of Consciousness: awake, oriented and patient cooperative  Airway & Oxygen Therapy: Patient Spontanous Breathing  Post-op Assessment: Report given to RN and Post -op Vital signs reviewed and stable  Post vital signs: Reviewed and stable  Last Vitals:  Vitals:   11/07/17 0940 11/07/17 0950  BP: 110/79 119/77  Pulse: (!) 44   Resp: 18 14  Temp: 36.6 C   SpO2: 100% 98%    Last Pain:  Vitals:   11/07/17 0940  TempSrc: Oral      Patients Stated Pain Goal: 6 (46/[id number]/29 [id number])  Complications: No apparent anesthesia complications

## 2017-11-07 NOTE — Discharge Instructions (Signed)
PATIENT INSTRUCTIONS POST-ANESTHESIA  IMMEDIATELY FOLLOWING SURGERY:  Do not drive or operate machinery for the first twenty four hours after surgery.  Do not make any important decisions for twenty four hours after surgery or while taking narcotic pain medications or sedatives.  If you develop intractable nausea and vomiting or a severe headache please notify your doctor immediately.  FOLLOW-UP:  Please make an appointment with your surgeon as instructed. You do not need to follow up with anesthesia unless specifically instructed to do so.  WOUND CARE INSTRUCTIONS (if applicable):  Keep a dry clean dressing on the anesthesia/puncture wound site if there is drainage.  Once the wound has quit draining you may leave it open to air.  Generally you should leave the bandage intact for twenty four hours unless there is drainage.  If the epidural site drains for more than 36-48 hours please call the anesthesia department.  QUESTIONS?:  Please feel free to call your physician or the hospital operator if you have any questions, and they will be happy to assist you.      Laparoscopic Cholecystectomy, Care After This sheet gives you information about how to care for yourself after your procedure. Your doctor may also give you more specific instructions. If you have problems or questions, contact your doctor. Follow these instructions at home: Care for cuts from surgery (incisions)   Follow instructions from your doctor about how to take care of your cuts from surgery. Make sure you: ? Wash your hands with soap and water before you change your bandage (dressing). If you cannot use soap and water, use hand sanitizer. ? Change your bandage as told by your doctor. ? Leave stitches (sutures), skin glue, or skin tape (adhesive) strips in place. They may need to stay in place for 2 weeks or longer. If tape strips get loose and curl up, you may trim the loose edges. Do not remove tape strips completely unless your  doctor says it is okay.  Do not take baths, swim, or use a hot tub until your doctor says it is okay. Ask your doctor if you can take showers. You may only be allowed to take sponge baths for bathing.  Check your surgical cut area every day for signs of infection. Check for: ? More redness, swelling, or pain. ? More fluid or blood. ? Warmth. ? Pus or a bad smell. Activity  Do not drive or use heavy machinery while taking prescription pain medicine.  Do not lift anything that is heavier than 10 lb (4.5 kg) until your doctor says it is okay.  Do not play contact sports until your doctor says it is okay.  Do not drive for 24 hours if you were given a medicine to help you relax (sedative).  Rest as needed. Do not return to work or school until your doctor says it is okay. General instructions  Take over-the-counter and prescription medicines only as told by your doctor.  To prevent or treat constipation while you are taking prescription pain medicine, your doctor may recommend that you: ? Drink enough fluid to keep your pee (urine) clear or pale yellow. ? Take over-the-counter or prescription medicines. ? Eat foods that are high in fiber, such as fresh fruits and vegetables, whole grains, and beans. ? Limit foods that are high in fat and processed sugars, such as fried and sweet foods. Contact a doctor if:  You develop a rash.  You have more redness, swelling, or pain around your surgical cuts.  You have more fluid or blood coming from your surgical cuts.  Your surgical cuts feel warm to the touch.  You have pus or a bad smell coming from your surgical cuts.  You have a fever.  One or more of your surgical cuts breaks open. Get help right away if:  You have trouble breathing.  You have chest pain.  You have pain that is getting worse in your shoulders.  You faint or feel dizzy when you stand.  You have very bad pain in your belly (abdomen).  You are sick to your  stomach (nauseous) for more than one day.  You have throwing up (vomiting) that lasts for more than one day.  You have leg pain. This information is not intended to replace advice given to you by your health care provider. Make sure you discuss any questions you have with your health care provider. Document Released: 08/17/2008 Document Revised: 05/29/2016 Document Reviewed: 04/26/2016 Elsevier Interactive Patient Education  2017 [employer].

## 2017-11-07 NOTE — Interval H&P Note (Signed)
History and Physical Interval Note:  11/07/2017 9:40 AM  Ronald Conner  has presented today for surgery, with the diagnosis of chronic cholecystitis  The various methods of treatment have been discussed with the patient and family. After consideration of risks, benefits and other options for treatment, the patient has consented to  Procedure(s): LAPAROSCOPIC CHOLECYSTECTOMY (N/A) as a surgical intervention .  The patient's history has been reviewed, patient examined, no change in status, stable for surgery.  I have reviewed the patient's chart and labs.  Questions were answered to the patient's satisfaction.     Aviva Signs

## 2017-11-08 ENCOUNTER — Ambulatory Visit: Payer: Medicare Other | Admitting: General Surgery

## 2017-11-08 ENCOUNTER — Encounter (HOSPITAL_COMMUNITY): Payer: Self-pay | Admitting: General Surgery

## 2017-11-16 ENCOUNTER — Encounter (INDEPENDENT_AMBULATORY_CARE_PROVIDER_SITE_OTHER): Payer: Self-pay | Admitting: Internal Medicine

## 2017-11-20 ENCOUNTER — Emergency Department (HOSPITAL_COMMUNITY): Payer: Medicare Other

## 2017-11-20 ENCOUNTER — Encounter (HOSPITAL_COMMUNITY): Payer: Self-pay | Admitting: Emergency Medicine

## 2017-11-20 ENCOUNTER — Emergency Department (HOSPITAL_COMMUNITY)
Admission: EM | Admit: 2017-11-20 | Discharge: 2017-11-21 | Disposition: A | Discharge status: Home / Self Care | Payer: Medicare Other | Attending: Emergency Medicine | Admitting: Emergency Medicine

## 2017-11-20 ENCOUNTER — Other Ambulatory Visit: Payer: Self-pay

## 2017-11-20 DIAGNOSIS — Z79899 Other long term (current) drug therapy: Secondary | ICD-10-CM | POA: Insufficient documentation

## 2017-11-20 DIAGNOSIS — Z87891 Personal history of nicotine dependence: Secondary | ICD-10-CM | POA: Insufficient documentation

## 2017-11-20 DIAGNOSIS — M546 Pain in thoracic spine: Secondary | ICD-10-CM | POA: Diagnosis not present

## 2017-11-20 DIAGNOSIS — R0789 Other chest pain: Secondary | ICD-10-CM

## 2017-11-20 DIAGNOSIS — I10 Essential (primary) hypertension: Secondary | ICD-10-CM | POA: Diagnosis not present

## 2017-11-20 DIAGNOSIS — R079 Chest pain, unspecified: Secondary | ICD-10-CM | POA: Diagnosis not present

## 2017-11-20 LAB — CBC
HEMATOCRIT: 45.4 % (ref 39.0–52.0)
Hemoglobin: 14.8 g/dL (ref 13.0–17.0)
MCH: 28.5 pg (ref 26.0–34.0)
MCHC: 32.6 g/dL (ref 30.0–36.0)
MCV: 87.3 fL (ref 78.0–100.0)
PLATELETS: 226 10*3/uL (ref 150–400)
RBC: 5.2 MIL/uL (ref 4.22–5.81)
RDW: 12.8 % (ref 11.5–15.5)
WBC: 7.3 10*3/uL (ref 4.0–10.5)

## 2017-11-20 LAB — URINALYSIS, ROUTINE W REFLEX MICROSCOPIC
BILIRUBIN URINE: NEGATIVE
Bacteria, UA: NONE SEEN
Glucose, UA: NEGATIVE mg/dL
KETONES UR: 5 mg/dL — AB
LEUKOCYTES UA: NEGATIVE
Nitrite: NEGATIVE
Protein, ur: 30 mg/dL — AB
SPECIFIC GRAVITY, URINE: 1.026 (ref 1.005–1.030)
pH: 5 (ref 5.0–8.0)

## 2017-11-20 LAB — D-DIMER, QUANTITATIVE (NOT AT ARMC): D DIMER QUANT: 0.55 ug{FEU}/mL — AB (ref 0.00–0.50)

## 2017-11-20 LAB — BASIC METABOLIC PANEL
Anion gap: 11 (ref 5–15)
BUN: 9 mg/dL (ref 6–20)
CALCIUM: 8.9 mg/dL (ref 8.9–10.3)
CO2: 24 mmol/L (ref 22–32)
CREATININE: 0.88 mg/dL (ref 0.61–1.24)
Chloride: 105 mmol/L (ref 101–111)
GFR calc Af Amer: >60 mL/min (ref >60)
GLUCOSE: 88 mg/dL (ref 65–99)
Potassium: 3.8 mmol/L (ref 3.5–5.1)
Sodium: 140 mmol/L (ref 135–145)

## 2017-11-20 LAB — TROPONIN I

## 2017-11-20 NOTE — ED Triage Notes (Signed)
Pt states he had gall bladder surgery 11/07/17, started right lower leg pain yesterday, back pain started today below right shoulder blade, and left chest tightness with right jaw pain, denies SOB/n/v/d

## 2017-11-21 ENCOUNTER — Emergency Department (HOSPITAL_COMMUNITY): Payer: Medicare Other

## 2017-11-21 DIAGNOSIS — R0789 Other chest pain: Secondary | ICD-10-CM | POA: Diagnosis not present

## 2017-11-21 NOTE — Discharge Instructions (Signed)
As discussed, your lab tests, ekg and imaging tests tonight are normal with no obvious reason for your symptoms. You may try tylenol or motrin if your pain returns. Call your doctor for a recheck this week if your symptoms continue.

## 2017-11-21 NOTE — Progress Notes (Signed)
Cardiology Office Note    Date:  11/23/2017   ID:  [name], DOB July 19, 1961, MRN [id number]  PCP:  Redmond School, MD  Cardiologist: Dr. Harl Bowie  Chief Complaint  Patient presents with  . Follow-up    recent Emergency Dept visit    History of Present Illness:    Ronald Conner is a 56 y.o. male with past medical history of HTN, GERD, and bradycardia who presents to the office today for Emergency Department follow-up.   He was last evaluated by Dr. Harl Bowie in 05/2017 and reported having an episode two weeks prior with headaches and a "funny feeling" along his chest at which time he checked his BP and it was significantly elevated to 228/113. He presented to the ED for further evaluation and was found to have a HR in the 40's with frequent PVC's noted. Lab work showed he was hypokalemic at the time, therefore HCTZ was discontinued at the time of his appointment and Lisinopril was further titrated to 20mg  daily.   He was evaluated in the ER on 3 separate occasions in 08/2017 for chest pain. His pain was thought to be atypical during his visits, as pain was often worse with taking a deep breath or relieved with bowel movements. Troponin values were negative during his visits and his EKG's showed no acute ischemic changes. Was started on Omeprazole.  He followed up with GI in the outpatient setting for episodes of intermittent RUQ pain. HIDA Scan was pursued and showed a gallbladder EF of 30%. He was referred to General Surgery and underwent laparoscopic cholecystectomy on 11/07/2017 with no immediate complications noted.   In talking with the patient today, he reports significant improvement in his symptoms since his recent surgery. He denies any repeat episodes of chest discomfort.  Does have occasional abdominal pain which resolves with belching.  He denies any recent orthopnea, PND, lower extremity edema, or palpitations. Does have occasional lightheadedness with the belching  episodes but this resolves within seconds to minutes. No other associated symptoms.    Past Medical History:  Diagnosis Date  . Acid reflux   . Family history of adverse reaction to anesthesia    MOM- PONV  . GERD (gastroesophageal reflux disease) 10/03/2017  . Hypertension     Past Surgical History:  Procedure Laterality Date  . CHOLECYSTECTOMY N/A 11/07/2017   Procedure: LAPAROSCOPIC CHOLECYSTECTOMY;  Surgeon: Aviva Signs, MD;  Location: [facility];  Service: General;  Laterality: N/A;  . ESOPHAGOGASTRODUODENOSCOPY N/A 10/07/2017   Procedure: ESOPHAGOGASTRODUODENOSCOPY (EGD);  Surgeon: Rogene Houston, MD;  Location: [facility];  Service: Endoscopy;  Laterality: N/A;  12:00pm  . GALLBLADDER SURGERY  11/07/2017  . NO PAST SURGERIES      Current Medications: Outpatient Medications Prior to Visit  Medication Sig Dispense Refill  . HYDROcodone-acetaminophen (NORCO/VICODIN) 5-325 MG tablet Take 1 tablet by mouth every 6 (six) hours as needed for moderate pain. 25 tablet 0  . Oxymetazoline HCl (AFRIN NASAL SPRAY NA) Place 1 spray into the nose daily as needed (stuffiness).    . pantoprazole (PROTONIX) 40 MG tablet Take 40 mg by mouth daily.    Marland Kitchen lisinopril (PRINIVIL,ZESTRIL) 20 MG tablet Take 1 tablet (20 mg total) by mouth daily. 90 tablet 3  . Simethicone (GAS-X PO) Take 1 capsule by mouth once as needed.    . dicyclomine (BENTYL) 10 MG capsule Take 10 mg by mouth 4 (four) times daily.    Marland Kitchen omeprazole (PRILOSEC) 20 MG capsule Take  20 mg by mouth daily.    . polyethylene glycol (MIRALAX) packet Take 17 g by mouth daily. 14 each 0   No facility-administered medications prior to visit.      Allergies:   Patient has no known allergies.   Social History   Socioeconomic History  . Marital status: Married    Spouse name: None  . Number of children: None  . Years of education: None  . Highest education level: None  Social Needs  . Financial resource strain: None  . Food  insecurity - worry: None  . Food insecurity - inability: None  . Transportation needs - medical: None  . Transportation needs - non-medical: None  Occupational History  . None  Tobacco Use  . Smoking status: Former Smoker    Types: Cigars    Last attempt to quit: 11/05/2007    Years since quitting: 10.0  . Smokeless tobacco: Former Systems developer    Types: Snuff    Quit date: 11/05/2015  . Tobacco comment: dips snuff for 25 years.cigar once or twicea year  Substance and Sexual Activity  . Alcohol use: No  . Drug use: No  . Sexual activity: Yes    Birth control/protection: None  Other Topics Concern  . None  Social History Narrative  . None     Family History:  The patient's family history includes Arrhythmia in his maternal grandmother and mother; Cataracts in his mother; Dementia in his paternal grandmother; Diabetes in his brother; Heart attack in his maternal grandmother; Heart attack (age of onset: 71) in his father; Hypertension in his brother and mother; Kidney failure in his brother; Leukemia in his mother; Obesity in his brother; Peripheral Artery Disease in his maternal grandfather and mother; Stroke in his maternal grandfather.   Review of Systems:   Please see the history of present illness.     General:  No chills, fever, night sweats or weight changes.  Cardiovascular:  No chest pain, dyspnea on exertion, edema, orthopnea, palpitations, paroxysmal nocturnal dyspnea. Dermatological: No rash, lesions/masses Respiratory: No cough, dyspnea Urologic: No hematuria, dysuria Abdominal:   No nausea, vomiting, diarrhea, bright red blood per rectum, melena, or hematemesis Neurologic:  No visual changes, wkns, changes in mental status. Positive for lightheadedness.   All other systems reviewed and are otherwise negative except as noted above.   Physical Exam:    VS:  BP 132/88   Pulse 64   SpO2 95%    General: Well developed, well nourished Caucasian male appearing in no acute  distress. Head: Normocephalic, atraumatic, sclera non-icteric, no xanthomas, nares are without discharge.  Neck: No carotid bruits. JVD not elevated.  Lungs: Respirations regular and unlabored, without wheezes or rales.  Heart: Regular rate and rhythm. No S3 or S4.  No murmur, no rubs, or gallops appreciated. Abdomen: Soft, non-tender, non-distended with normoactive bowel sounds. No hepatomegaly. No rebound/guarding. No obvious abdominal masses. Incision from recent lap cholecystectomy appear well-healing with no drainage or significant erythema.  Msk:  Strength and tone appear normal for age. No joint deformities or effusions. Extremities: No clubbing or cyanosis. No edema.  Distal pedal pulses are 2+ bilaterally. Neuro: Alert and oriented X 3. Moves all extremities spontaneously. No focal deficits noted. Psych:  Responds to questions appropriately with a normal affect. Skin: No rashes or lesions noted  Wt Readings from Last 3 Encounters:  11/20/17 212 lb (96.2 kg)  11/04/17 212 lb (96.2 kg)  11/03/17 213 lb (96.6 kg)     Studies/Labs  Reviewed:   EKG:  EKG is not ordered today.    Recent Labs: 05/06/2017: TSH 0.817 05/23/2017: Magnesium 2.0 10/27/2017: ALT 22 11/20/2017: BUN 9; Creatinine, Ser 0.88; Hemoglobin 14.8; Platelets 226; Potassium 3.8; Sodium 140   Lipid Panel No results found for: CHOL, TRIG, HDL, CHOLHDL, VLDL, LDLCALC, LDLDIRECT  Additional studies/ records that were reviewed today include:   EKG: 11/20/2017: Sinus bradycardia, HR 53, with no acute ST or T-wave changes.   Assessment:    1. Essential hypertension   2. Bradycardia   3. Chronic cholecystitis      Plan:   In order of problems listed above:  1. HTN - BP is well-controlled at 132/88 during today's visit. Reports this has been in the 110's-130's/70's when checked at home.  - continue Lisinopril 20mg  daily. K+ and kidney function were recently checked on 11/20/2017 and stable at that time.   2.  Bradycardia - baseline HR is in the 40's - 50's. He denies any associated symptoms with this. HR is in the 60's during today's visit.  - continue to avoid AV nodal blocking agents.   3. GERD/ Abdominal Pain - had been followed by GI and HIDA scan showed a gallbladder EF of 30%. He was referred to General Surgery and underwent a laparoscopic cholecystectomy on 11/07/2017 with no immediate complications noted.  - reports significant improvement in his abdominal/chest pain since surgery and his appetite has significantly improved. Has follow-up with Dr. Arnoldo Morale scheduled for tomorrow.  - Would not pursue further ischemic evaluation at this time as his episodes of pain have resolved. I informed the patient to contact our office if he develops recurrent chest pain as we could consider further testing at that time.     Medication Adjustments/Labs and Tests Ordered: Current medicines are reviewed at length with the patient today.  Concerns regarding medicines are outlined above.  Medication changes, Labs and Tests ordered today are listed in the Patient Instructions below. Patient Instructions  Medication Instructions:  Your physician recommends that you continue on your current medications as directed. Please refer to the Current Medication list given to you today.   Labwork: NONE   Testing/Procedures: NONE   Follow-Up: Your physician wants you to follow-up in: 1 Year with Dr. Harl Bowie. You will receive a reminder letter in the mail two months in advance. If you don't receive a letter, please call our office to schedule the follow-up appointment.  Any Other Special Instructions Will Be Listed Below (If Applicable).  If you need a refill on your cardiac medications before your next appointment, please call your pharmacy. Thank you for choosing [facility]!     Signed, Erma Heritage, PA-C  11/23/2017 4:33 PM    Ronald Conner, [address]  [place], [place]  [address] Phone: [phone]; Fax: [phone]  [address], [place], [place] [address] Phone: [phone]

## 2017-11-21 NOTE — ED Provider Notes (Signed)
[facility] EMERGENCY DEPARTMENT Provider Note   CSN: [id number] Arrival date & time: 11/20/17  2003     History   Chief Complaint Chief Complaint  Patient presents with  . Chest Pain  . Back Pain  . Leg Pain    HPI Ronald Conner is a 56 y.o. male with a past medical history of hypertension, GERD and recently underwent a laparoscopic cholecystectomy on December 17 presenting with vague chest pain, describing fleeting episodes of stabbing pain of his left chest and midsternal which has been intermittent since yesterday along with right lower leg pain described as a charley horse sensation in his right calf, also yesterday but now resolved.  This afternoon he also experienced sudden onset of right mid back pain which he stated initially was intermittent and worsened with movement and walking and over time has become constant and was pretty intense prior to arrival but has nearly resolved.  He denies fevers or chills, denies shortness of breath or pleuritic chest pain, no neck pain, abdominal pain, nausea vomiting or diaphoresis.  He also denies swelling in his lower extremities.  He has had no recent long periods of sedation or other risk factors for DVT or PE.   The history is provided by the patient and the spouse.    Past Medical History:  Diagnosis Date  . Acid reflux   . Family history of adverse reaction to anesthesia    MOM- PONV  . GERD (gastroesophageal reflux disease) 10/03/2017  . Hypertension     Patient Active Problem List   Diagnosis Date Noted  . Chronic cholecystitis   . GERD (gastroesophageal reflux disease) 10/03/2017  . Gastroesophageal reflux disease without esophagitis 10/03/2017    Past Surgical History:  Procedure Laterality Date  . CHOLECYSTECTOMY N/A 11/07/2017   Procedure: LAPAROSCOPIC CHOLECYSTECTOMY;  Surgeon: Aviva Signs, MD;  Location: [facility];  Service: General;  Laterality: N/A;  . ESOPHAGOGASTRODUODENOSCOPY N/A 10/07/2017   Procedure:  ESOPHAGOGASTRODUODENOSCOPY (EGD);  Surgeon: Rogene Houston, MD;  Location: [facility];  Service: Endoscopy;  Laterality: N/A;  12:00pm  . GALLBLADDER SURGERY  11/07/2017  . NO PAST SURGERIES         Home Medications    Prior to Admission medications   Medication Sig Start Date End Date Taking? Authorizing Provider  dicyclomine (BENTYL) 10 MG capsule Take 10 mg by mouth 4 (four) times daily.    [provider]  HYDROcodone-acetaminophen (NORCO/VICODIN) 5-325 MG tablet Take 1 tablet by mouth every 6 (six) hours as needed for moderate pain. 11/07/17   Aviva Signs, MD  lisinopril (PRINIVIL,ZESTRIL) 20 MG tablet Take 1 tablet (20 mg total) by mouth daily. 05/06/17 11/04/17  Arnoldo Lenis, MD  omeprazole (PRILOSEC) 20 MG capsule Take 20 mg by mouth daily.    [provider]  Oxymetazoline HCl (AFRIN NASAL SPRAY NA) Place 1 spray into the nose daily as needed (stuffiness).    [provider]  polyethylene glycol (MIRALAX) packet Take 17 g by mouth daily. 10/27/17   Fredia Sorrow, MD  Simethicone (GAS-X PO) Take 1 capsule by mouth once as needed.    [provider]    Family History Family History  Problem Relation Age of Onset  . Arrhythmia Mother        h/o DCCV  . Leukemia Mother   . Hypertension Mother   . Cataracts Mother   . Peripheral Artery Disease Mother   . Heart attack Father 77  . Kidney failure Brother  on dialysis  . Hypertension Brother   . Arrhythmia Maternal Grandmother   . Heart attack Maternal Grandmother   . Stroke Maternal Grandfather   . Peripheral Artery Disease Maternal Grandfather        double amputee  . Dementia Paternal Grandmother   . Diabetes Brother   . Obesity Brother     Social History Social History   Tobacco Use  . Smoking status: Former Smoker    Types: Cigars    Last attempt to quit: 11/05/2007    Years since quitting: 10.0  . Smokeless tobacco: Former Systems developer    Types: Snuff    Quit  date: 11/05/2015  . Tobacco comment: dips snuff for 25 years.cigar once or twicea year  Substance Use Topics  . Alcohol use: No  . Drug use: No     Allergies   Patient has no known allergies.   Review of Systems Review of Systems  Constitutional: Negative for diaphoresis and fever.  HENT: Negative for congestion and sore throat.   Eyes: Negative.   Respiratory: Negative for chest tightness and shortness of breath.   Cardiovascular: Positive for chest pain.  Gastrointestinal: Negative for abdominal pain, nausea and vomiting.  Genitourinary: Negative.   Musculoskeletal: Positive for arthralgias and back pain. Negative for joint swelling and neck pain.  Skin: Negative.  Negative for rash and wound.  Neurological: Negative for dizziness, weakness, light-headedness, numbness and headaches.  Psychiatric/Behavioral: Negative.      Physical Exam Updated Vital Signs BP 137/86   Pulse (!) 49   Temp 98.4 F (36.9 C) (Oral)   Resp 14   Ht 5\' 10"  (1.778 m)   Wt 96.2 kg (212 lb)   SpO2 100%   BMI 30.42 kg/m   Physical Exam  Constitutional: He appears well-developed and well-nourished.  HENT:  Head: Normocephalic and atraumatic.  Eyes: Conjunctivae are normal.  Neck: Normal range of motion.  Cardiovascular: Normal rate, regular rhythm, normal heart sounds and intact distal pulses.  Pulmonary/Chest: Effort normal and breath sounds normal. No stridor. He has no decreased breath sounds. He has no wheezes. He has no rhonchi. He has no rales.  Abdominal: Soft. Bowel sounds are normal. He exhibits no distension. There is no tenderness. There is no guarding.  No CVA tenderness.  Musculoskeletal: Normal range of motion.       Right lower leg: He exhibits no edema.       Left lower leg: He exhibits no edema.  Negative Homans sign.  Neurological: He is alert.  Skin: Skin is warm and dry.  Psychiatric: He has a normal mood and affect.  Nursing note and vitals reviewed.    ED  Treatments / Results  Labs (all labs ordered are listed, but only abnormal results are displayed) Labs Reviewed  URINALYSIS, ROUTINE W REFLEX MICROSCOPIC - Abnormal; Notable for the following components:      Result Value   Color, Urine AMBER (*)    Hgb urine dipstick SMALL (*)    Ketones, ur 5 (*)    Protein, ur 30 (*)    Squamous Epithelial / LPF 0-5 (*)    All other components within normal limits  D-DIMER, QUANTITATIVE (NOT AT [facility]) - Abnormal; Notable for the following components:   D-Dimer, Quant 0.55 (*)    All other components within normal limits  BASIC METABOLIC PANEL  CBC  TROPONIN I    EKG  ED ECG REPORT   Date: 11/21/2017  Rate: 53  Rhythm: sinus bradycardia  QRS Axis: normal  Intervals: normal  ST/T Wave abnormalities: normal  Conduction Disutrbances:none  Narrative Interpretation:   Old EKG Reviewed: unchanged  I have personally reviewed the EKG tracing and agree with the computerized printout as noted.   Radiology Dg Chest 2 View  Result Date: 11/20/2017 CLINICAL DATA:  Chest pain radiating to left shoulder. 2 weeks status post cholecystectomy. EXAM: CHEST  2 VIEW COMPARISON:  09/28/2017 FINDINGS: The heart size and mediastinal contours are within normal limits. Both lungs are clear. The visualized skeletal structures are unremarkable. IMPRESSION: No active cardiopulmonary disease. Electronically Signed   By: Earle Gell M.D.   On: 11/20/2017 21:00   Dg Abd Acute W/chest  Result Date: 11/21/2017 CLINICAL DATA:  Acute onset of right shoulder pain and left-sided chest tightness. Recent cholecystectomy. EXAM: DG ABDOMEN ACUTE W/ 1V CHEST COMPARISON:  Chest radiograph performed 11/20/2017, and CT of the abdomen and pelvis performed 10/27/2017 FINDINGS: The lungs are well-aerated and clear. There is no evidence of focal opacification, pleural effusion or pneumothorax. The cardiomediastinal silhouette is within normal limits. A likely bone island or calcified  granuloma is noted overlying the left midlung zone. The visualized bowel gas pattern is unremarkable. Scattered stool and air are seen within the colon; there is no evidence of small bowel dilatation to suggest obstruction. No free intra-abdominal air is identified on the provided upright view. Clips are noted within the right upper quadrant, reflecting prior cholecystectomy. No acute osseous abnormalities are seen; the sacroiliac joints are unremarkable in appearance. IMPRESSION: 1. Unremarkable bowel gas pattern; no free intra-abdominal air seen. Small amount of stool noted in the colon. 2. No acute cardiopulmonary process seen. Electronically Signed   By: Garald Balding M.D.   On: 11/21/2017 01:14    Procedures Procedures (including critical care time)  Medications Ordered in ED Medications - No data to display   Initial Impression / Assessment and Plan / ED Course  I have reviewed the triage vital signs and the nursing notes.  Pertinent labs & imaging results that were available during my care of the patient were reviewed by me and considered in my medical decision making (see chart for details).     Patient with very nonspecific symptoms, nearly resolved prior to discharge home.  EKG is stable, he does have a chronic bradycardia which was reviewed and documented elsewhere in his chart.  His age-adjusted d-dimer is negative.  He has no pleuritic quality to his symptoms, no tachycardia and no hypoxia therefore I doubt this is a PE.  His chest pain is atypical for ACS.  He does endorse sleeping semi-recombinant since his surgery secondary to abdominal soreness, I suspect he may have some thoracic musculoskeletal strain secondary to this positional change.  Advised heat, NSAIDs or Tylenol.  Final Clinical Impressions(s) / ED Diagnoses   Final diagnoses:  Atypical chest pain  Acute right-sided thoracic back pain    ED Discharge Orders    None       Landis Martins 11/21/17 Johnsie Cancel, MD 11/23/17 [phone]

## 2017-11-23 ENCOUNTER — Ambulatory Visit (INDEPENDENT_AMBULATORY_CARE_PROVIDER_SITE_OTHER): Payer: Medicare Other | Admitting: Student

## 2017-11-23 ENCOUNTER — Encounter: Payer: Self-pay | Admitting: Student

## 2017-11-23 VITALS — BP 132/88 | HR 64

## 2017-11-23 DIAGNOSIS — R001 Bradycardia, unspecified: Secondary | ICD-10-CM

## 2017-11-23 DIAGNOSIS — K811 Chronic cholecystitis: Secondary | ICD-10-CM | POA: Diagnosis not present

## 2017-11-23 DIAGNOSIS — I10 Essential (primary) hypertension: Secondary | ICD-10-CM | POA: Diagnosis not present

## 2017-11-23 MED ORDER — LISINOPRIL 20 MG PO TABS: 20.0000 mg | ORAL_TABLET | Freq: Every day | ORAL | 3 refills | Status: DC

## 2017-11-23 NOTE — Patient Instructions (Signed)
Medication Instructions:  Your physician recommends that you continue on your current medications as directed. Please refer to the Current Medication list given to you today.   Labwork: NONE   Testing/Procedures: NONE   Follow-Up: Your physician wants you to follow-up in: 1 Year with Dr. Branch.  You will receive a reminder letter in the mail two months in advance. If you don't receive a letter, please call our office to schedule the follow-up appointment.   Any Other Special Instructions Will Be Listed Below (If Applicable).     If you need a refill on your cardiac medications before your next appointment, please call your pharmacy. Thank you for choosing [facility]!    

## 2017-11-24 ENCOUNTER — Encounter: Payer: Self-pay | Admitting: General Surgery

## 2017-11-24 ENCOUNTER — Ambulatory Visit (INDEPENDENT_AMBULATORY_CARE_PROVIDER_SITE_OTHER): Payer: Self-pay | Admitting: General Surgery

## 2017-11-24 VITALS — BP 128/83 | HR 45 | Temp 97.1°F | Ht 70.0 in | Wt 205.0 lb

## 2017-11-24 DIAGNOSIS — Z09 Encounter for follow-up examination after completed treatment for conditions other than malignant neoplasm: Secondary | ICD-10-CM

## 2017-11-24 NOTE — Progress Notes (Signed)
Subjective:     Ronald Conner  Status post laparoscopic cholecystectomy.  Doing well.  Had some mild right shoulder pain but this is resolving.  Is pleased with the results.  Preoperative symptoms resolved. Objective:    BP 128/83   Pulse (!) 45   Temp (!) 97.1 F (36.2 C)   Ht 5\' 10"  (1.778 m)   Wt 205 lb (93 kg)   BMI 29.41 kg/m   General:  alert, cooperative and no distress  Abdomen soft, incisions healing well. Final pathology consistent with diagnosis.     Assessment:    Doing well postoperatively.    Plan:   Increase activity as able.  Follow-up as needed.

## 2017-11-25 ENCOUNTER — Encounter (INDEPENDENT_AMBULATORY_CARE_PROVIDER_SITE_OTHER): Payer: Self-pay | Admitting: Internal Medicine

## 2017-11-25 ENCOUNTER — Ambulatory Visit (INDEPENDENT_AMBULATORY_CARE_PROVIDER_SITE_OTHER): Payer: Medicare Other | Admitting: Internal Medicine

## 2017-11-25 VITALS — BP 140/72 | HR 72 | Temp 97.5°F | Ht 70.0 in | Wt 203.8 lb

## 2017-11-25 DIAGNOSIS — R142 Eructation: Secondary | ICD-10-CM

## 2017-11-25 NOTE — Progress Notes (Signed)
Subjective:    Patient ID: Ronald Conner, male    DOB: October 06, 1961, 57 y.o.   MRN: [id number]  HPI Here today for f/u. Last seen in November with GERD symptoms. Underwent and EGD 10/07/2017 which revealed:  Impression:               - Normal proximal esophagus and mid esophagus.                           - Esophageal mucosal changes suspicious for                            short-segment Barrett's esophagus. Biopsied.                           - Z-line irregular, 38 cm from the incisors.                           - 2 cm hiatal hernia.                           - Two gastric polyps. Biopsied.                           - Normal duodenal bulb and second portion of the  In December underwent a leptoscopic cholecystectomy for chronic cholecystitis. Today he states the Protonix and then eat breakfast. He has no problems after eating lunch. He is taking Protonix BID. He states every night he has pressure in his chest and he has to force himself to belch. Symptoms will last about 30 minutes.  Sometimes he can taste the acid reflux after belching.  His appetite is okay. He has lost 40 pounds which was intentional.  Usually has a BM daily. No melena or BRRB He has never undergone a colonoscopy.        Review of Systems Past Medical History:  Diagnosis Date  . Acid reflux   . Family history of adverse reaction to anesthesia    MOM- PONV  . GERD (gastroesophageal reflux disease) 10/03/2017  . Hypertension     Past Surgical History:  Procedure Laterality Date  . CHOLECYSTECTOMY N/A 11/07/2017   Procedure: LAPAROSCOPIC CHOLECYSTECTOMY;  Surgeon: Aviva Signs, MD;  Location: [facility];  Service: General;  Laterality: N/A;  . ESOPHAGOGASTRODUODENOSCOPY N/A 10/07/2017   Procedure: ESOPHAGOGASTRODUODENOSCOPY (EGD);  Surgeon: Rogene Houston, MD;  Location: [facility];  Service: Endoscopy;  Laterality: N/A;  12:00pm  . GALLBLADDER SURGERY  11/07/2017  . NO PAST SURGERIES      No Known  Allergies  Current Outpatient Medications on File Prior to Visit  Medication Sig Dispense Refill  . HYDROcodone-acetaminophen (NORCO/VICODIN) 5-325 MG tablet Take 1 tablet by mouth every 6 (six) hours as needed for moderate pain. 25 tablet 0  . lisinopril (PRINIVIL,ZESTRIL) 20 MG tablet Take 1 tablet (20 mg total) by mouth daily. 90 tablet 3  . Oxymetazoline HCl (AFRIN NASAL SPRAY NA) Place 1 spray into the nose daily as needed (stuffiness).    . pantoprazole (PROTONIX) 40 MG tablet Take 40 mg by mouth daily.     No current facility-administered medications on file prior to visit.         Objective:   Physical Exam Blood pressure  140/72, pulse 72, temperature (!) 97.5 F (36.4 C), height 5\' 10"  (1.778 m), weight 203 lb 12.8 oz (92.4 kg). Alert and oriented. Skin warm and dry. Oral mucosa is moist.   . Sclera anicteric, conjunctivae is pink. Thyroid not enlarged. No cervical lymphadenopathy. Lungs clear. Heart regular rate and rhythm.  Abdomen is soft. Bowel sounds are positive. No hepatomegaly. No abdominal masses felt. No tenderness.  No edema to lower extremities.           Assessment & Plan:  Belching. Try GAS X at night before bed.  OV in 1 year.  Recall for colonoscopy in 3 months.

## 2017-11-25 NOTE — Patient Instructions (Signed)
Try Gas X at night.

## 2017-12-05 ENCOUNTER — Encounter (HOSPITAL_COMMUNITY): Payer: Self-pay | Admitting: Emergency Medicine

## 2017-12-05 ENCOUNTER — Emergency Department (HOSPITAL_COMMUNITY)
Admission: EM | Admit: 2017-12-05 | Discharge: 2017-12-05 | Disposition: A | Discharge status: Home / Self Care | Payer: Medicare Other | Attending: Emergency Medicine | Admitting: Emergency Medicine

## 2017-12-05 ENCOUNTER — Other Ambulatory Visit: Payer: Self-pay

## 2017-12-05 DIAGNOSIS — M549 Dorsalgia, unspecified: Secondary | ICD-10-CM | POA: Insufficient documentation

## 2017-12-05 DIAGNOSIS — M25519 Pain in unspecified shoulder: Secondary | ICD-10-CM | POA: Insufficient documentation

## 2017-12-05 DIAGNOSIS — R110 Nausea: Secondary | ICD-10-CM | POA: Diagnosis not present

## 2017-12-05 DIAGNOSIS — Z5321 Procedure and treatment not carried out due to patient leaving prior to being seen by health care provider: Secondary | ICD-10-CM | POA: Insufficient documentation

## 2017-12-05 DIAGNOSIS — R6884 Jaw pain: Secondary | ICD-10-CM | POA: Insufficient documentation

## 2017-12-05 DIAGNOSIS — R42 Dizziness and giddiness: Secondary | ICD-10-CM | POA: Diagnosis not present

## 2017-12-05 DIAGNOSIS — M546 Pain in thoracic spine: Secondary | ICD-10-CM | POA: Diagnosis not present

## 2017-12-05 NOTE — ED Notes (Signed)
Ronald Conner, with registration called to inform pt leaving ED lobby, ambulatory, leaving with family

## 2017-12-05 NOTE — ED Triage Notes (Addendum)
Pt c/o of pain between shoulder blades, nausea, lightheaded when standing, right jaw pain and pressure in ears and head, thoracic pain increased with standing

## 2017-12-06 ENCOUNTER — Other Ambulatory Visit: Payer: Self-pay

## 2017-12-06 NOTE — Patient Outreach (Signed)
[place] [facility]) Care Management  12/06/2017  Ronald Conner [birth year] [id number]   1st Telephone call to patient for ED Utilization screening.  No answer.  HIPAA complaint voice message left with contact information.     Plan:  RN Health Coach will make an outreach attempt to the patient within the next three business days  [place], BSN, [place]:  [phone] Fax: [phone]

## 2017-12-07 ENCOUNTER — Other Ambulatory Visit: Payer: Self-pay

## 2017-12-07 NOTE — Patient Outreach (Signed)
[place] [facility]) Care Management  12/07/2017  Ronald Conner Feb 22, 1961 [id number]  2nd Telephone call to patient for ED Utilization screening.  Wife answered the phone. HIPAA compliant message left with contact information.  Plan:  RN Health Coach will make an outreach attempt to the patient within three business days.  Lazaro Arms RN, BSN, [place] Direct Dial:  [phone] Fax: [phone]

## 2017-12-08 DIAGNOSIS — M546 Pain in thoracic spine: Secondary | ICD-10-CM | POA: Diagnosis not present

## 2017-12-08 DIAGNOSIS — Z6828 Body mass index (BMI) 28.0-28.9, adult: Secondary | ICD-10-CM | POA: Diagnosis not present

## 2017-12-08 DIAGNOSIS — R1310 Dysphagia, unspecified: Secondary | ICD-10-CM | POA: Diagnosis not present

## 2017-12-08 DIAGNOSIS — K224 Dyskinesia of esophagus: Secondary | ICD-10-CM | POA: Diagnosis not present

## 2017-12-09 ENCOUNTER — Other Ambulatory Visit: Payer: Self-pay

## 2017-12-09 NOTE — Patient Outreach (Signed)
[place] [facility]) Care Management  12/09/2017  Ronald Conner 09-17-1961 [id number]   3rd Telephone call to patient for ED Utilization screening.  No answer.  HIPAA complaint voice message left with contact information.    Plan: RN Health Coach will send letter to attempt outreach.  If no response within ten business days will proceed with case closure.    Lazaro Arms RN, BSN, [place] Direct Dial:  [phone] Fax: [phone]

## 2017-12-13 ENCOUNTER — Other Ambulatory Visit: Payer: Self-pay

## 2017-12-13 ENCOUNTER — Emergency Department (HOSPITAL_COMMUNITY)
Admission: EM | Admit: 2017-12-13 | Discharge: 2017-12-13 | Disposition: A | Discharge status: Home / Self Care | Payer: Medicare Other | Attending: Emergency Medicine | Admitting: Emergency Medicine

## 2017-12-13 ENCOUNTER — Encounter (HOSPITAL_COMMUNITY): Payer: Self-pay | Admitting: *Deleted

## 2017-12-13 DIAGNOSIS — Z79899 Other long term (current) drug therapy: Secondary | ICD-10-CM | POA: Diagnosis not present

## 2017-12-13 DIAGNOSIS — Z87891 Personal history of nicotine dependence: Secondary | ICD-10-CM | POA: Diagnosis not present

## 2017-12-13 DIAGNOSIS — R21 Rash and other nonspecific skin eruption: Secondary | ICD-10-CM | POA: Insufficient documentation

## 2017-12-13 LAB — BASIC METABOLIC PANEL
Anion gap: 8 (ref 5–15)
BUN: 10 mg/dL (ref 6–20)
CO2: 27 mmol/L (ref 22–32)
CREATININE: 0.92 mg/dL (ref 0.61–1.24)
Calcium: 9 mg/dL (ref 8.9–10.3)
Chloride: 104 mmol/L (ref 101–111)
GFR calc Af Amer: >60 mL/min (ref >60)
GLUCOSE: 103 mg/dL — AB (ref 65–99)
POTASSIUM: 3.2 mmol/L — AB (ref 3.5–5.1)
SODIUM: 139 mmol/L (ref 135–145)

## 2017-12-13 LAB — CBC WITH DIFFERENTIAL/PLATELET
Basophils Absolute: 0 10*3/uL (ref 0.0–0.1)
Basophils Relative: 0 %
EOS ABS: 0.2 10*3/uL (ref 0.0–0.7)
EOS PCT: 4 %
HCT: 41.6 % (ref 39.0–52.0)
Hemoglobin: 13.9 g/dL (ref 13.0–17.0)
LYMPHS ABS: 2.4 10*3/uL (ref 0.7–4.0)
LYMPHS PCT: 40 %
MCH: 28.4 pg (ref 26.0–34.0)
MCHC: 33.4 g/dL (ref 30.0–36.0)
MCV: 84.9 fL (ref 78.0–100.0)
MONO ABS: 0.5 10*3/uL (ref 0.1–1.0)
MONOS PCT: 9 %
Neutro Abs: 2.9 10*3/uL (ref 1.7–7.7)
Neutrophils Relative %: 47 %
PLATELETS: 181 10*3/uL (ref 150–400)
RBC: 4.9 MIL/uL (ref 4.22–5.81)
RDW: 12.8 % (ref 11.5–15.5)
WBC: 6.1 10*3/uL (ref 4.0–10.5)

## 2017-12-13 LAB — TROPONIN I

## 2017-12-13 MED ORDER — PREDNISONE 50 MG PO TABS
60.0000 mg | ORAL_TABLET | Freq: Once | ORAL | Status: AC
  Administered 2017-12-13: 60 mg via ORAL
  Filled 2017-12-13: qty 1

## 2017-12-13 MED ORDER — DIPHENHYDRAMINE HCL 25 MG PO CAPS
25.0000 mg | ORAL_CAPSULE | Freq: Once | ORAL | Status: AC
  Administered 2017-12-13: 25 mg via ORAL
  Filled 2017-12-13: qty 1

## 2017-12-13 MED ORDER — DIPHENHYDRAMINE HCL 25 MG PO TABS: 25.0000 mg | ORAL_TABLET | Freq: Four times a day (QID) | ORAL | 0 refills | Status: DC

## 2017-12-13 MED ORDER — PREDNISONE 50 MG PO TABS: ORAL_TABLET | ORAL | 0 refills | Status: DC

## 2017-12-13 NOTE — Discharge Instructions (Signed)
Stop taking the omeprazole and go back to your previous Protonix.  Let Dr. Gerarda Fraction know about your allergic reaction.  Take the steroids as prescribed.  Return to the ED if you develop difficulty breathing, chest pain or any other concerns.

## 2017-12-13 NOTE — ED Notes (Signed)
Pt ambulatory to waiting room. Pt verbalized understanding of discharge instructions.   

## 2017-12-13 NOTE — ED Triage Notes (Signed)
Pt states he started a new medication last week and started having a rash x 2 days later; pt has red rash to left elbow and purple spots to his right knee and lower leg

## 2017-12-13 NOTE — ED Provider Notes (Signed)
[facility] EMERGENCY DEPARTMENT Provider Note   CSN: [id number] Arrival date & time: 12/13/17  0141     History   Chief Complaint Chief Complaint  Patient presents with  . Allergic Reaction    HPI Ronald Conner is a 57 y.o. male.  Patient states she noticed itchy blotchy red rash to his right knee and leg January 18.  This was after he took one dose of omeprazole.  He was switched to the day before by his PCP from Protonix to omeprazole.  He took some Benadryl which helped the itchiness and the redness is starting to resolve.  He states that this did not bother him until he noticed a similar rash appearing tonight on his left elbow.  Started out as itchy red blotches as well.  He did not take any Benadryl tonight.  No chest pain or shortness of breath.  No difficulty breathing or difficulty swallowing.  No other new medications.  Patient also takes lisinopril and a pain medication.  No recent new foods, exposures or clothing.  No new hygiene products.  Patient had a cholecystectomy a month ago and has known hiatal hernia, Barrett's esophagus and acid reflux.  He denies any tick bites or recent travel.   The history is provided by the patient.  Allergic Reaction  Presenting symptoms: rash     Past Medical History:  Diagnosis Date  . Acid reflux   . Family history of adverse reaction to anesthesia    MOM- PONV  . GERD (gastroesophageal reflux disease) 10/03/2017  . Hypertension     Patient Active Problem List   Diagnosis Date Noted  . Chronic cholecystitis   . GERD (gastroesophageal reflux disease) 10/03/2017  . Gastroesophageal reflux disease without esophagitis 10/03/2017    Past Surgical History:  Procedure Laterality Date  . CHOLECYSTECTOMY N/A 11/07/2017   Procedure: LAPAROSCOPIC CHOLECYSTECTOMY;  Surgeon: Aviva Signs, MD;  Location: [facility] ORS;  Service: General;  Laterality: N/A;  . ESOPHAGOGASTRODUODENOSCOPY N/A 10/07/2017   Procedure: ESOPHAGOGASTRODUODENOSCOPY  (EGD);  Surgeon: Rogene Houston, MD;  Location: [facility] ENDO SUITE;  Service: Endoscopy;  Laterality: N/A;  12:00pm  . GALLBLADDER SURGERY  11/07/2017  . NO PAST SURGERIES         Home Medications    Prior to Admission medications   Medication Sig Start Date End Date Taking? Authorizing Provider  HYDROcodone-acetaminophen (NORCO/VICODIN) 5-325 MG tablet Take 1 tablet by mouth every 6 (six) hours as needed for moderate pain. 11/07/17   Aviva Signs, MD  lisinopril (PRINIVIL,ZESTRIL) 20 MG tablet Take 1 tablet (20 mg total) by mouth daily. 11/23/17 02/21/18  Strader, Fransisco Hertz, PA-C  Oxymetazoline HCl (AFRIN NASAL SPRAY NA) Place 1 spray into the nose daily as needed (stuffiness).    [provider]  pantoprazole (PROTONIX) 40 MG tablet Take 40 mg by mouth daily.    [provider]    Family History Family History  Problem Relation Age of Onset  . Arrhythmia Mother        h/o DCCV  . Leukemia Mother   . Hypertension Mother   . Cataracts Mother   . Peripheral Artery Disease Mother   . Heart attack Father 29  . Kidney failure Brother        on dialysis  . Hypertension Brother   . Arrhythmia Maternal Grandmother   . Heart attack Maternal Grandmother   . Stroke Maternal Grandfather   . Peripheral Artery Disease Maternal Grandfather  double amputee  . Dementia Paternal Grandmother   . Diabetes Brother   . Obesity Brother     Social History Social History   Tobacco Use  . Smoking status: Former Smoker    Types: Cigars    Last attempt to quit: 11/05/2007    Years since quitting: 10.1  . Smokeless tobacco: Former Systems developer    Types: Snuff    Quit date: 11/05/2015  . Tobacco comment: dips snuff for 25 years.cigar once or twicea year  Substance Use Topics  . Alcohol use: No  . Drug use: No     Allergies   Patient has no known allergies.   Review of Systems Review of Systems  Constitutional: Negative for activity change, appetite change and fever.    HENT: Negative for congestion and rhinorrhea.   Eyes: Negative for visual disturbance.  Respiratory: Negative for cough and shortness of breath.   Cardiovascular: Negative for chest pain and leg swelling.  Gastrointestinal: Negative for abdominal pain, nausea and vomiting.  Genitourinary: Negative for dysuria, frequency and genital sores.  Musculoskeletal: Negative for arthralgias, back pain and myalgias.  Skin: Positive for rash.  Neurological: Negative for dizziness, weakness and headaches.   all other systems are negative except as noted in the HPI and PMH.     Physical Exam Updated Vital Signs BP (!) 157/101 (BP Location: Left Arm)   Pulse (!) 57   Temp 98.2 F (36.8 C) (Oral)   Resp 18   Ht 5\' 10"  (1.778 m)   Wt 93 kg (205 lb)   SpO2 100%   BMI 29.41 kg/m   Physical Exam  Constitutional: He is oriented to person, place, and time. He appears well-developed and well-nourished. No distress.  HENT:  Head: Normocephalic and atraumatic.  Mouth/Throat: Oropharynx is clear and moist. No oropharyngeal exudate.  Eyes: Conjunctivae and EOM are normal. Pupils are equal, round, and reactive to light.  Neck: Normal range of motion. Neck supple.  No meningismus.  Cardiovascular: Normal rate, regular rhythm, normal heart sounds and intact distal pulses.  No murmur heard. Pulmonary/Chest: Effort normal and breath sounds normal. No respiratory distress. He exhibits no tenderness.  Abdominal: Soft. There is no tenderness. There is no rebound and no guarding.  Musculoskeletal: Normal range of motion. He exhibits no edema or tenderness.  Neurological: He is alert and oriented to person, place, and time. No cranial nerve deficit. He exhibits normal muscle tone. Coordination normal.  No ataxia on finger to nose bilaterally. No pronator drift. 5/5 strength throughout. CN 2-12 intact.Equal grip strength. Sensation intact.   Skin: Skin is warm. Capillary refill takes less than 2 seconds. Rash  noted.  Rash as depicted.  Itchy red papules to left elbow.  Rash on right leg and knee With more violaceous hue.  No blanching.  Psychiatric: He has a normal mood and affect. His behavior is normal.  Nursing note and vitals reviewed.          ED Treatments / Results  Labs (all labs ordered are listed, but only abnormal results are displayed) Labs Reviewed  BASIC METABOLIC PANEL - Abnormal; Notable for the following components:      Result Value   Potassium 3.2 (*)    Glucose, Bld 103 (*)    All other components within normal limits  CBC WITH DIFFERENTIAL/PLATELET  TROPONIN I    EKG  EKG Interpretation  Date/Time:  Tuesday December 13 2017 02:32:54 EST Ventricular Rate:  42 PR Interval:    QRS  Duration: 112 QT Interval:  444 QTC Calculation: 371 R Axis:   9 Text Interpretation:  Sinus bradycardia Borderline intraventricular conduction delay No significant change was found Confirmed by Ezequiel Essex [phone]) on 12/13/2017 2:36:07 AM       Radiology No results found.  Procedures Procedures (including critical care time)  Medications Ordered in ED Medications  diphenhydrAMINE (BENADRYL) capsule 25 mg (25 mg Oral Given 12/13/17 0228)  predniSONE (DELTASONE) tablet 60 mg (60 mg Oral Given 12/13/17 0227)     Initial Impression / Assessment and Plan / ED Course  I have reviewed the triage vital signs and the nursing notes.  Pertinent labs & imaging results that were available during my care of the patient were reviewed by me and considered in my medical decision making (see chart for details).    Rash of uncertain etiology. Patient thinks related to recent switch to omeprazole. Denies any other new exposures.  No mucous membrane involvement or oral involvement. No tongue or lip swelling. No chest pain or SOB.  No evidence of Stevens-Johnson syndrome or TEN.  Labs reassuring.  No thrombocytopenia.  Unclear etiology of patient's rash.  May be due to his new  medication. May also be from environmental exposure. Advised patient to stop this omeprazole and go back to his previous PPI.  Follow-up with PCP.  Will give steroid burst as well as antihistamines.  Return precautions discussed.   Final Clinical Impressions(s) / ED Diagnoses   Final diagnoses:  Rash    ED Discharge Orders    None       Shella Lahman, Annie Main, MD 12/13/17 [phone]

## 2017-12-20 DIAGNOSIS — M47814 Spondylosis without myelopathy or radiculopathy, thoracic region: Secondary | ICD-10-CM | POA: Diagnosis not present

## 2017-12-20 DIAGNOSIS — M47812 Spondylosis without myelopathy or radiculopathy, cervical region: Secondary | ICD-10-CM | POA: Diagnosis not present

## 2017-12-21 ENCOUNTER — Other Ambulatory Visit (HOSPITAL_COMMUNITY): Payer: Self-pay | Admitting: Neurosurgery

## 2017-12-21 ENCOUNTER — Ambulatory Visit (HOSPITAL_COMMUNITY)
Admission: RE | Admit: 2017-12-21 | Discharge: 2017-12-21 | Disposition: A | Discharge status: Home / Self Care | Payer: Medicare Other | Source: Ambulatory Visit | Attending: Neurosurgery | Admitting: Neurosurgery

## 2017-12-21 DIAGNOSIS — M47814 Spondylosis without myelopathy or radiculopathy, thoracic region: Secondary | ICD-10-CM | POA: Diagnosis not present

## 2017-12-21 DIAGNOSIS — M546 Pain in thoracic spine: Secondary | ICD-10-CM | POA: Diagnosis not present

## 2017-12-23 ENCOUNTER — Other Ambulatory Visit: Payer: Self-pay

## 2017-12-23 NOTE — Patient Outreach (Signed)
[place] [facility]) Care Management  12/23/2017  Ronald Conner [birth year] [id number]    Patient has not responded to calls or letter. Will proceed with case closure.     Plan: Will notify care management assistant of case status.     Lazaro Arms RN, BSN, [place] Direct Dial:  [phone] Fax: [phone]

## 2018-01-10 ENCOUNTER — Ambulatory Visit (INDEPENDENT_AMBULATORY_CARE_PROVIDER_SITE_OTHER): Payer: Medicare Other | Admitting: Internal Medicine

## 2018-01-11 DIAGNOSIS — R7309 Other abnormal glucose: Secondary | ICD-10-CM | POA: Diagnosis not present

## 2018-01-11 DIAGNOSIS — E663 Overweight: Secondary | ICD-10-CM | POA: Diagnosis not present

## 2018-01-11 DIAGNOSIS — Z6827 Body mass index (BMI) 27.0-27.9, adult: Secondary | ICD-10-CM | POA: Diagnosis not present

## 2018-01-11 DIAGNOSIS — K219 Gastro-esophageal reflux disease without esophagitis: Secondary | ICD-10-CM | POA: Diagnosis not present

## 2018-01-11 DIAGNOSIS — K2270 Barrett's esophagus without dysplasia: Secondary | ICD-10-CM | POA: Diagnosis not present

## 2018-01-23 ENCOUNTER — Encounter (HOSPITAL_COMMUNITY): Payer: Self-pay | Admitting: Emergency Medicine

## 2018-01-23 ENCOUNTER — Emergency Department (HOSPITAL_COMMUNITY)
Admission: EM | Admit: 2018-01-23 | Discharge: 2018-01-23 | Disposition: A | Discharge status: Home / Self Care | Payer: Medicare Other | Attending: Emergency Medicine | Admitting: Emergency Medicine

## 2018-01-23 ENCOUNTER — Emergency Department (HOSPITAL_COMMUNITY): Payer: Medicare Other

## 2018-01-23 ENCOUNTER — Other Ambulatory Visit: Payer: Self-pay

## 2018-01-23 DIAGNOSIS — R0602 Shortness of breath: Secondary | ICD-10-CM | POA: Diagnosis not present

## 2018-01-23 DIAGNOSIS — Z79899 Other long term (current) drug therapy: Secondary | ICD-10-CM | POA: Diagnosis not present

## 2018-01-23 DIAGNOSIS — I10 Essential (primary) hypertension: Secondary | ICD-10-CM | POA: Insufficient documentation

## 2018-01-23 DIAGNOSIS — Z9049 Acquired absence of other specified parts of digestive tract: Secondary | ICD-10-CM | POA: Insufficient documentation

## 2018-01-23 DIAGNOSIS — R1013 Epigastric pain: Secondary | ICD-10-CM | POA: Insufficient documentation

## 2018-01-23 DIAGNOSIS — Z87891 Personal history of nicotine dependence: Secondary | ICD-10-CM | POA: Insufficient documentation

## 2018-01-23 DIAGNOSIS — R1010 Upper abdominal pain, unspecified: Secondary | ICD-10-CM

## 2018-01-23 DIAGNOSIS — R110 Nausea: Secondary | ICD-10-CM | POA: Diagnosis not present

## 2018-01-23 DIAGNOSIS — R0789 Other chest pain: Secondary | ICD-10-CM | POA: Diagnosis not present

## 2018-01-23 DIAGNOSIS — R079 Chest pain, unspecified: Secondary | ICD-10-CM | POA: Diagnosis not present

## 2018-01-23 LAB — HEPATIC FUNCTION PANEL
ALT: 12 U/L — AB (ref 17–63)
AST: 21 U/L (ref 15–41)
Albumin: 4.1 g/dL (ref 3.5–5.0)
Alkaline Phosphatase: 64 U/L (ref 38–126)
Bilirubin, Direct: 0.2 mg/dL (ref 0.1–0.5)
Indirect Bilirubin: 0.4 mg/dL (ref 0.3–0.9)
TOTAL PROTEIN: 6.8 g/dL (ref 6.5–8.1)
Total Bilirubin: 0.6 mg/dL (ref 0.3–1.2)

## 2018-01-23 LAB — BASIC METABOLIC PANEL
ANION GAP: 9 (ref 5–15)
BUN: 12 mg/dL (ref 6–20)
CALCIUM: 9.4 mg/dL (ref 8.9–10.3)
CO2: 28 mmol/L (ref 22–32)
Chloride: 104 mmol/L (ref 101–111)
Creatinine, Ser: 0.8 mg/dL (ref 0.61–1.24)
Glucose, Bld: 108 mg/dL — ABNORMAL HIGH (ref 65–99)
Potassium: 4.4 mmol/L (ref 3.5–5.1)
Sodium: 141 mmol/L (ref 135–145)

## 2018-01-23 LAB — LIPASE, BLOOD: Lipase: 37 U/L (ref 11–51)

## 2018-01-23 LAB — CBC
HCT: 45.3 % (ref 39.0–52.0)
HEMOGLOBIN: 14.5 g/dL (ref 13.0–17.0)
MCH: 28.3 pg (ref 26.0–34.0)
MCHC: 32 g/dL (ref 30.0–36.0)
MCV: 88.3 fL (ref 78.0–100.0)
PLATELETS: 224 10*3/uL (ref 150–400)
RBC: 5.13 MIL/uL (ref 4.22–5.81)
RDW: 13.5 % (ref 11.5–15.5)
WBC: 6.9 10*3/uL (ref 4.0–10.5)

## 2018-01-23 LAB — TROPONIN I: Troponin I: <0.03 ng/mL (ref <0.03)

## 2018-01-23 MED ORDER — ALUM & MAG HYDROXIDE-SIMETH 200-200-20 MG/5ML PO SUSP: 30.0000 mL | Freq: Once | ORAL | Status: DC

## 2018-01-23 MED ORDER — LIDOCAINE VISCOUS 2 % MT SOLN: 15.0000 mL | Freq: Once | OROMUCOSAL | Status: DC

## 2018-01-23 NOTE — ED Notes (Signed)
ekg handed to Dr. Butler 

## 2018-01-23 NOTE — ED Triage Notes (Signed)
PT c/o epigastric and middle chest pain with SOB that started today with nausea. PT states he had gallbladder removed 11/07/17.

## 2018-01-23 NOTE — Discharge Instructions (Signed)
Your evaluated in the emergency department for some upper abdominal pain that radiated into your chest.  We did blood work and x-ray and EKG and no obvious cause of his pain was found.  It most likely is related to the surgical site that might have a small muscle tear.  You should follow-up with your regular doctor and return if any worsening symptoms.

## 2018-01-23 NOTE — ED Provider Notes (Signed)
[facility] Provider Note   CSN: [id number] Arrival date & time: 01/23/18  1412     History   Chief Complaint Chief Complaint  Patient presents with  . Chest Pain    HPI Ronald Conner is a 57 y.o. male.  Pleasant 57 year old male presents with nausea, subxiphoid and chest discomfort that started this morning.  He states he was moving his bowels and may be straining a little bit and had acute nausea and pain in his upper abdomen.  Since then it kind of radiated through his chest.  Does not go through the back.  There is no vomiting.  He has not had a fever.  He had a lap chole about 2-1/2 months ago for sludge in his gallbladder and he has been slowly been improving since then.  The history is provided by the patient.  Chest Pain   This is a new problem. The current episode started 6 to 12 hours ago. The pain is associated with movement. The pain is present in the substernal region and epigastric region. The pain is moderate. The quality of the pain is described as sharp. The pain does not radiate. Associated symptoms include abdominal pain, nausea and shortness of breath. Pertinent negatives include no back pain, no cough, no dizziness, no fever, no headaches, no leg pain, no lower extremity edema, no palpitations, no syncope, no vomiting and no weakness. He has tried rest for the symptoms. The treatment provided moderate relief. Risk factors include male gender.  His past medical history is significant for hypertension.  Pertinent negatives for past medical history include no CAD and no seizures.    Past Medical History:  Diagnosis Date  . Acid reflux   . Family history of adverse reaction to anesthesia    MOM- PONV  . GERD (gastroesophageal reflux disease) 10/03/2017  . Hypertension     Patient Active Problem List   Diagnosis Date Noted  . Chronic cholecystitis   . GERD (gastroesophageal reflux disease) 10/03/2017  . Gastroesophageal reflux disease  without esophagitis 10/03/2017    Past Surgical History:  Procedure Laterality Date  . CHOLECYSTECTOMY N/A 11/07/2017   Procedure: LAPAROSCOPIC CHOLECYSTECTOMY;  Surgeon: Aviva Signs, MD;  Location: [facility] ORS;  Service: General;  Laterality: N/A;  . ESOPHAGOGASTRODUODENOSCOPY N/A 10/07/2017   Procedure: ESOPHAGOGASTRODUODENOSCOPY (EGD);  Surgeon: Rogene Houston, MD;  Location: [facility] ENDO SUITE;  Service: Endoscopy;  Laterality: N/A;  12:00pm  . GALLBLADDER SURGERY  11/07/2017  . NO PAST SURGERIES         Home Medications    Prior to Admission medications   Medication Sig Start Date End Date Taking? Authorizing Provider  diphenhydrAMINE (BENADRYL) 25 MG tablet Take 1 tablet (25 mg total) by mouth every 6 (six) hours. 12/13/17   Rancour, Annie Main, MD  HYDROcodone-acetaminophen (NORCO/VICODIN) 5-325 MG tablet Take 1 tablet by mouth every 6 (six) hours as needed for moderate pain. 11/07/17   Aviva Signs, MD  lisinopril (PRINIVIL,ZESTRIL) 20 MG tablet Take 1 tablet (20 mg total) by mouth daily. 11/23/17 02/21/18  Strader, Fransisco Hertz, PA-C  Oxymetazoline HCl (AFRIN NASAL SPRAY NA) Place 1 spray into the nose daily as needed (stuffiness).    [provider]  pantoprazole (PROTONIX) 40 MG tablet Take 40 mg by mouth daily.    [provider]  predniSONE (DELTASONE) 50 MG tablet 1 tablet PO daily 12/13/17   Ezequiel Essex, MD    Family History Family History  Problem Relation Age of Onset  .  Arrhythmia Mother        h/o DCCV  . Leukemia Mother   . Hypertension Mother   . Cataracts Mother   . Peripheral Artery Disease Mother   . Heart attack Father 52  . Kidney failure Brother        on dialysis  . Hypertension Brother   . Arrhythmia Maternal Grandmother   . Heart attack Maternal Grandmother   . Stroke Maternal Grandfather   . Peripheral Artery Disease Maternal Grandfather        double amputee  . Dementia Paternal Grandmother   . Diabetes Brother   . Obesity Brother      Social History Social History   Tobacco Use  . Smoking status: Former Smoker    Types: Cigars    Last attempt to quit: 11/05/2007    Years since quitting: 10.2  . Smokeless tobacco: Former Systems developer    Types: Snuff    Quit date: 11/05/2015  . Tobacco comment: dips snuff for 25 years.cigar once or twicea year  Substance Use Topics  . Alcohol use: No  . Drug use: No     Allergies   Patient has no known allergies.   Review of Systems Review of Systems  Constitutional: Negative for chills and fever.  HENT: Negative for ear pain and sore throat.   Eyes: Negative for pain and visual disturbance.  Respiratory: Positive for shortness of breath. Negative for cough.   Cardiovascular: Positive for chest pain. Negative for palpitations and syncope.  Gastrointestinal: Positive for abdominal pain and nausea. Negative for vomiting.  Genitourinary: Negative for dysuria and hematuria.  Musculoskeletal: Negative for arthralgias and back pain.  Skin: Negative for color change and rash.  Neurological: Negative for dizziness, seizures, syncope, weakness and headaches.  All other systems reviewed and are negative.    Physical Exam Updated Vital Signs BP 119/79 (BP Location: Right Arm)   Pulse (!) 58   Temp 98.1 F (36.7 C) (Oral)   Resp 16   Ht 5\' 9"  (1.753 m)   Wt 88.5 kg (195 lb)   SpO2 100%   BMI 28.80 kg/m   Physical Exam  Constitutional: He appears well-developed and well-nourished.  HENT:  Head: Normocephalic and atraumatic.  Eyes: Conjunctivae are normal.  Neck: Neck supple.  Cardiovascular: Normal rate and regular rhythm.  No murmur heard. Pulmonary/Chest: Effort normal and breath sounds normal. No respiratory distress.  Abdominal: Soft. Bowel sounds are normal. There is no splenomegaly or hepatomegaly. There is tenderness in the epigastric area. No hernia.    Musculoskeletal: He exhibits no edema, tenderness or deformity.  Neurological: He is alert.  Skin: Skin  is warm and dry.  Psychiatric: He has a normal mood and affect.  Nursing note and vitals reviewed.    ED Treatments / Results  Labs (all labs ordered are listed, but only abnormal results are displayed) Labs Reviewed  BASIC METABOLIC PANEL - Abnormal; Notable for the following components:      Result Value   Glucose, Bld 108 (*)    All other components within normal limits  CBC  TROPONIN I  HEPATIC FUNCTION PANEL  LIPASE, BLOOD    EKG  EKG Interpretation  Date/Time:  Monday January 23 2018 14:18:53 EST Ventricular Rate:  49 PR Interval:  170 QRS Duration: 108 QT Interval:  412 QTC Calculation: 372 R Axis:   61 Text Interpretation:  Sinus bradycardia Incomplete right bundle branch block Borderline ECG similar to prior 1/19, 6/18 Confirmed by Aletta Edouard (  [id number]) on 01/23/2018 4:44:06 PM       Radiology Dg Chest 2 View  Result Date: 01/23/2018 CLINICAL DATA:  Epigastric and central chest pain with shortness of breath beginning today. Nausea. Former smoker. EXAM: CHEST  2 VIEW COMPARISON:  Chest radiograph November 20, 2017 in CT abdomen and pelvis October 27, 2017 FINDINGS: Chest radiograph November 20, 2017 Cardiomediastinal silhouette is normal. No pleural effusions or focal consolidations. Trachea projects midline and there is no pneumothorax. Soft tissue planes and included osseous structures are non-suspicious. Surgical clips in the included right abdomen compatible with cholecystectomy. Confluence of osseous shadows projecting at LEFT posterior fifth rib, unchanged. Early degenerative change of the thoracic spine. Probable greater curvature of the stomach extending above the level of the diaphragm, increased from prior CT. IMPRESSION: Stable examination: No acute cardiopulmonary process. Potential ventral diaphragmatic hernia versus projectional artifact. Electronically Signed   By: Elon Alas M.D.   On: 01/23/2018 14:51    Procedures Procedures (including critical  care time)  Medications Ordered in ED Medications - No data to display   Initial Impression / Assessment and Plan / ED Course  I have reviewed the triage vital signs and the nursing notes.  Pertinent labs & imaging results that were available during my care of the patient were reviewed by me and considered in my medical decision making (see chart for details).  Clinical Course as of Jan 26 1748  Mon Jan 23, 2018  1918 I reviewed the results with the patient with his -2 troponins normal LFTs and lipase.  He is happy to find out that there was no obvious abnormality.  I think most likely his pain is due to some muscle tear at the site of his subxiphoid port.  It is quite reproducible but no obvious hernia and no ecchymosis in the area.  He is going to call his doctor to follow-up but I think otherwise he can be discharged from the ED.  [MB]    Clinical Course User Index [MB] Hayden Rasmussen, MD     Final Clinical Impressions(s) / ED Diagnoses   Final diagnoses:  Upper abdominal pain    ED Discharge Orders    None       Hayden Rasmussen, MD 01/25/18 [phone]

## 2018-01-25 ENCOUNTER — Other Ambulatory Visit (HOSPITAL_COMMUNITY): Payer: Self-pay | Admitting: Neurosurgery

## 2018-01-25 ENCOUNTER — Other Ambulatory Visit: Payer: Self-pay

## 2018-01-25 DIAGNOSIS — R1310 Dysphagia, unspecified: Secondary | ICD-10-CM

## 2018-01-25 NOTE — Patient Outreach (Signed)
[place] [facility]) Care Management  01/25/2018  Ronald Conner 07/29/1961 [id number]   Telephone Screen Referral Date : 01/24/2018 Referral Source: [facility] ED Census Referral Reason: ED Utilization  Insurance: Medicare  Outreach attempt # 1  To patient.  Telephone call to th patient for screening.  Wife answered the phone and the patient was not available.  HIPAA compliant message left with contact information.   Plan: RN Health Coach will make an outreach attempt to the patient in one business day.    Lazaro Arms RN, BSN, [place] Direct Dial:  [phone] Fax: [phone]

## 2018-01-26 ENCOUNTER — Other Ambulatory Visit: Payer: Self-pay

## 2018-01-26 NOTE — Patient Outreach (Signed)
[place] [facility]) Care Management  01/26/2018  MY MADARIAGA 01-Jan-1961 [id number]   Telephone Screen Referral Date :01/24/2018 Referral Source: [facility] ED Census Referral Reason: ED Utilization  Insurance: Medicare  Outreach attempt # 2 To patient.  Unsuccessful outreach attempt to the patient. No answer. Unable to leave a message.   Plan: RN Health Coach will send letter for outreach attempt. RN Health Coach will make 3rd telephone outreach attempt in ten business days. No answer I will proceed with case closure.    Lazaro Arms RN, BSN, [place] Direct Dial:  [phone] Fax: [phone]

## 2018-01-27 ENCOUNTER — Ambulatory Visit (HOSPITAL_COMMUNITY)
Admission: RE | Admit: 2018-01-27 | Discharge: 2018-01-27 | Disposition: A | Discharge status: Home / Self Care | Payer: Medicare Other | Source: Ambulatory Visit | Attending: Neurosurgery | Admitting: Neurosurgery

## 2018-01-27 DIAGNOSIS — R1310 Dysphagia, unspecified: Secondary | ICD-10-CM | POA: Insufficient documentation

## 2018-02-02 DIAGNOSIS — M47814 Spondylosis without myelopathy or radiculopathy, thoracic region: Secondary | ICD-10-CM | POA: Diagnosis not present

## 2018-02-02 DIAGNOSIS — R1310 Dysphagia, unspecified: Secondary | ICD-10-CM | POA: Diagnosis not present

## 2018-02-09 ENCOUNTER — Other Ambulatory Visit: Payer: Self-pay

## 2018-02-09 NOTE — Patient Outreach (Signed)
[place] [facility]) Care Management  02/09/2018  Ronald Conner 1961-09-27 [id number]   Telephone Screen Referral Date : 01/24/2018 Referral Source: [facility] ED Census Referral Reason: ED Utilizer Insurance: Medicare  Outreach attempt #  3 To patient.   Unsuccessful outreach to the patient for screening. Wife answered the phone and was able to verify HIPAA.  She is on his release of information.  RN Health Coach was able to explain the program to her and gave her my contact information.   Plan: RN Health Coach will close the case due to inability to contact the patient.    Lazaro Arms RN, BSN, [place] Direct Dial:  [phone] Fax: [phone]

## 2018-02-09 NOTE — Patient Outreach (Deleted)
[place] [facility]) Care Management  02/09/2018  UNO ESAU 1961/06/28 [id number]   Telephone Screen Referral Date : 01/24/2018 Referral Source: [facility] ED Census  Referral Reason: ED Utilization Insurance: Medicare  Outreach attempt # 3 To patient.   Unsuccessful outreach to the patient. His wife answered the phone and was able to verify HIPAA.  She states that the patient was unavailable at the time. Left a compliant voicemail with the wife with contact information.  Plan: RN Health Coach will close the case due inability to contact the patient.   Lazaro Arms RN, BSN, [place] Direct Dial:  [phone] Fax: [phone]

## 2018-02-13 ENCOUNTER — Ambulatory Visit (INDEPENDENT_AMBULATORY_CARE_PROVIDER_SITE_OTHER): Payer: Medicare Other | Admitting: Internal Medicine

## 2018-03-01 ENCOUNTER — Encounter (HOSPITAL_COMMUNITY): Payer: Self-pay | Admitting: Emergency Medicine

## 2018-03-01 ENCOUNTER — Emergency Department (HOSPITAL_COMMUNITY)
Admission: EM | Admit: 2018-03-01 | Discharge: 2018-03-01 | Disposition: A | Discharge status: Home / Self Care | Payer: Medicare Other | Attending: Emergency Medicine | Admitting: Emergency Medicine

## 2018-03-01 ENCOUNTER — Emergency Department (HOSPITAL_COMMUNITY): Payer: Medicare Other

## 2018-03-01 ENCOUNTER — Encounter (INDEPENDENT_AMBULATORY_CARE_PROVIDER_SITE_OTHER): Payer: Self-pay | Admitting: *Deleted

## 2018-03-01 ENCOUNTER — Other Ambulatory Visit: Payer: Self-pay

## 2018-03-01 DIAGNOSIS — M542 Cervicalgia: Secondary | ICD-10-CM | POA: Diagnosis not present

## 2018-03-01 DIAGNOSIS — Z7982 Long term (current) use of aspirin: Secondary | ICD-10-CM | POA: Insufficient documentation

## 2018-03-01 DIAGNOSIS — R260 Ataxic gait: Secondary | ICD-10-CM | POA: Insufficient documentation

## 2018-03-01 DIAGNOSIS — R197 Diarrhea, unspecified: Secondary | ICD-10-CM | POA: Diagnosis not present

## 2018-03-01 DIAGNOSIS — R001 Bradycardia, unspecified: Secondary | ICD-10-CM | POA: Diagnosis not present

## 2018-03-01 DIAGNOSIS — M79606 Pain in leg, unspecified: Secondary | ICD-10-CM | POA: Diagnosis not present

## 2018-03-01 DIAGNOSIS — R51 Headache: Secondary | ICD-10-CM | POA: Insufficient documentation

## 2018-03-01 DIAGNOSIS — Z79899 Other long term (current) drug therapy: Secondary | ICD-10-CM | POA: Diagnosis not present

## 2018-03-01 DIAGNOSIS — R202 Paresthesia of skin: Secondary | ICD-10-CM | POA: Insufficient documentation

## 2018-03-01 DIAGNOSIS — Z87891 Personal history of nicotine dependence: Secondary | ICD-10-CM | POA: Insufficient documentation

## 2018-03-01 DIAGNOSIS — I10 Essential (primary) hypertension: Secondary | ICD-10-CM | POA: Insufficient documentation

## 2018-03-01 DIAGNOSIS — R079 Chest pain, unspecified: Secondary | ICD-10-CM | POA: Diagnosis not present

## 2018-03-01 DIAGNOSIS — G8929 Other chronic pain: Secondary | ICD-10-CM | POA: Insufficient documentation

## 2018-03-01 DIAGNOSIS — R110 Nausea: Secondary | ICD-10-CM | POA: Diagnosis not present

## 2018-03-01 DIAGNOSIS — R0789 Other chest pain: Secondary | ICD-10-CM | POA: Diagnosis not present

## 2018-03-01 LAB — CBC WITH DIFFERENTIAL/PLATELET
BASOS PCT: 0 %
Basophils Absolute: 0 10*3/uL (ref 0.0–0.1)
EOS ABS: 0.2 10*3/uL (ref 0.0–0.7)
Eosinophils Relative: 3 %
HCT: 40 % (ref 39.0–52.0)
Hemoglobin: 13.1 g/dL (ref 13.0–17.0)
Lymphocytes Relative: 34 %
Lymphs Abs: 2.3 10*3/uL (ref 0.7–4.0)
MCH: 28.4 pg (ref 26.0–34.0)
MCHC: 32.8 g/dL (ref 30.0–36.0)
MCV: 86.8 fL (ref 78.0–100.0)
MONO ABS: 0.6 10*3/uL (ref 0.1–1.0)
MONOS PCT: 9 %
Neutro Abs: 3.7 10*3/uL (ref 1.7–7.7)
Neutrophils Relative %: 54 %
Platelets: 193 10*3/uL (ref 150–400)
RBC: 4.61 MIL/uL (ref 4.22–5.81)
RDW: 13.7 % (ref 11.5–15.5)
WBC: 6.8 10*3/uL (ref 4.0–10.5)

## 2018-03-01 LAB — COMPREHENSIVE METABOLIC PANEL
ALBUMIN: 3.6 g/dL (ref 3.5–5.0)
ALK PHOS: 63 U/L (ref 38–126)
ALT: 10 U/L — ABNORMAL LOW (ref 17–63)
AST: 15 U/L (ref 15–41)
Anion gap: 9 (ref 5–15)
BILIRUBIN TOTAL: 0.8 mg/dL (ref 0.3–1.2)
BUN: 10 mg/dL (ref 6–20)
CALCIUM: 9.1 mg/dL (ref 8.9–10.3)
CO2: 25 mmol/L (ref 22–32)
Chloride: 107 mmol/L (ref 101–111)
Creatinine, Ser: 0.77 mg/dL (ref 0.61–1.24)
GFR calc Af Amer: >60 mL/min (ref >60)
GFR calc non Af Amer: >60 mL/min (ref >60)
GLUCOSE: 93 mg/dL (ref 65–99)
Potassium: 3.6 mmol/L (ref 3.5–5.1)
Sodium: 141 mmol/L (ref 135–145)
TOTAL PROTEIN: 6.2 g/dL — AB (ref 6.5–8.1)

## 2018-03-01 LAB — I-STAT TROPONIN, ED: TROPONIN I, POC: 0 ng/mL (ref 0.00–0.08)

## 2018-03-01 LAB — LIPASE, BLOOD: Lipase: 40 U/L (ref 11–51)

## 2018-03-01 MED ORDER — SODIUM CHLORIDE 0.9 % IV BOLUS
1000.0000 mL | Freq: Once | INTRAVENOUS | Status: AC
  Administered 2018-03-01: 1000 mL via INTRAVENOUS

## 2018-03-01 MED ORDER — SODIUM CHLORIDE 0.9 % IV BOLUS
500.0000 mL | Freq: Once | INTRAVENOUS | Status: AC
  Administered 2018-03-01: 500 mL via INTRAVENOUS

## 2018-03-01 MED ORDER — ONDANSETRON HCL 4 MG/2ML IJ SOLN
4.0000 mg | Freq: Once | INTRAMUSCULAR | Status: AC
  Administered 2018-03-01: 4 mg via INTRAVENOUS
  Filled 2018-03-01: qty 2

## 2018-03-01 NOTE — Discharge Instructions (Signed)
Please call Dr. Rex Kras office today to have him see you today or tomorrow at the latest.  If he does not feel the symptoms are related to any new spinal problems he should consider seeing a neurologist.  You can call Dr. Freddie Apley office to get an appointment.  He will evaluate your legs for the unusual sensations that you are experiencing.  Also consider making an another appointment with the cardiologist since you do have continuing episodes of chest pain and they did not finish your evaluation because you had told him your chest pain was gone.

## 2018-03-01 NOTE — ED Notes (Signed)
Patient is resting comfortably. 

## 2018-03-01 NOTE — ED Notes (Signed)
Patient transported to CT 

## 2018-03-01 NOTE — ED Triage Notes (Signed)
Pt c/o chest pressure since 0130 with nausea, dizziness, diarrhea, R shoulder pain, bilateral leg pain

## 2018-03-01 NOTE — ED Provider Notes (Signed)
[facility] EMERGENCY DEPARTMENT Provider Note   CSN: [id number] Arrival date & time: 03/01/18  0216  Time seen 02:28 AM   History   Chief Complaint Chief Complaint  Patient presents with  . Chest Pain    HPI Ronald Conner is a 57 y.o. male.  HPI patient has multiple ED visits for chest pain.  He was last seen by cardiology on January 2 at which point he told him he was on longer having chest pain however looking back at his records he was seen multiple times in the ED for chest pain, twice in October, 3 times in November, once in December, and then again on March 4 and then tonight.  When he saw them and told him he was a longer having chest pain they did not pursue their evaluation of his chest pain.  Patient also had laparoscopic cholecystectomy done November 09, 2007.  He states he still is trying to get rid of the gas that they infused to do his procedure and he has chest soreness from that.  Patient has chronic neck pain since 2008 when a tree limb fell and hit them on the back of the neck.  He is followed by Dr. Carloyn Manner, neurosurgeon in [place] every 3 months for that.  He states about 130 this morning he got up to use the bathroom and he started having a headache.  He states it is a little better now.  He states it was behind his eyes and was a throbbing pain.  He states "my brain is throbbing".  He denies any visual changes.  He states his headache is almost gone now.  He also states his legs felt like they were burning and he felt like he had "blood rushing down my legs 1000 times a second".  His ears are stuffy and he felt short of breath.  His chest has been sore which she states he has had chronically for several months.  He was able to walk but states his "kneecaps felt shaky".  He denies any recent head injury.  He has chronic neck pain since he was hit in the back of the neck while tree branch in 2008.  He has some nausea.  He states he has had diarrhea today and at one point had 3  episodes an hour.  He states he did eat pizza last night and drank coffee this morning and he feels both of those still bother his stomach after he has had his gallbladder removed.  Patient states he was able to walk despite his legs feeling like they were burning on fire.  PCP Redmond [employer], MD   Past Medical History:  Diagnosis Date  . Acid reflux   . Family history of adverse reaction to anesthesia    MOM- PONV  . GERD (gastroesophageal reflux disease) 10/03/2017  . Hypertension     Patient Active Problem List   Diagnosis Date Noted  . Chronic cholecystitis   . GERD (gastroesophageal reflux disease) 10/03/2017  . Gastroesophageal reflux disease without esophagitis 10/03/2017    Past Surgical History:  Procedure Laterality Date  . CHOLECYSTECTOMY N/A 11/07/2017   Procedure: LAPAROSCOPIC CHOLECYSTECTOMY;  Surgeon: Aviva Signs, MD;  Location: [facility] ORS;  Service: General;  Laterality: N/A;  . ESOPHAGOGASTRODUODENOSCOPY N/A 10/07/2017   Procedure: ESOPHAGOGASTRODUODENOSCOPY (EGD);  Surgeon: Rogene Houston, MD;  Location: [facility] ENDO SUITE;  Service: Endoscopy;  Laterality: N/A;  12:00pm  . GALLBLADDER SURGERY  11/07/2017  . NO PAST SURGERIES  Home Medications    Prior to Admission medications   Medication Sig Start Date End Date Taking? Authorizing Provider  aspirin EC 81 MG tablet Take 81 mg by mouth daily as needed for mild pain.    [provider]  diphenhydrAMINE (BENADRYL) 25 MG tablet Take 1 tablet (25 mg total) by mouth every 6 (six) hours. 12/13/17   Rancour, Annie Main, MD  HYDROcodone-acetaminophen (NORCO/VICODIN) 5-325 MG tablet Take 1 tablet by mouth every 6 (six) hours as needed for moderate pain. 11/07/17   Aviva Signs, MD  lisinopril (PRINIVIL,ZESTRIL) 20 MG tablet Take 1 tablet (20 mg total) by mouth daily. Patient taking differently: Take 10 mg by mouth daily.  11/23/17 02/21/18  Strader, Fransisco Hertz, PA-C  Oxymetazoline HCl (AFRIN NASAL SPRAY NA)  Place 1 spray into the nose daily as needed (stuffiness).    [provider]  pantoprazole (PROTONIX) 40 MG tablet Take 40 mg by mouth daily as needed (acid reflux).     [provider]  predniSONE (DELTASONE) 50 MG tablet 1 tablet PO daily Patient not taking: Reported on 01/23/2018 12/13/17   Ezequiel Essex, MD    Family History Family History  Problem Relation Age of Onset  . Arrhythmia Mother        h/o DCCV  . Leukemia Mother   . Hypertension Mother   . Cataracts Mother   . Peripheral Artery Disease Mother   . Heart attack Father 36  . Kidney failure Brother        on dialysis  . Hypertension Brother   . Arrhythmia Maternal Grandmother   . Heart attack Maternal Grandmother   . Stroke Maternal Grandfather   . Peripheral Artery Disease Maternal Grandfather        double amputee  . Dementia Paternal Grandmother   . Diabetes Brother   . Obesity Brother     Social History Social History   Tobacco Use  . Smoking status: Former Smoker    Types: Cigars    Last attempt to quit: 11/05/2007    Years since quitting: 10.3  . Smokeless tobacco: Former Systems developer    Types: Snuff    Quit date: 11/05/2015  . Tobacco comment: dips snuff for 25 years.cigar once or twicea year  Substance Use Topics  . Alcohol use: No  . Drug use: No     Allergies   Protonix [pantoprazole sodium]   Review of Systems Review of Systems  Constitutional: Negative.  Negative for activity change, appetite change and fever.  Eyes: Negative.   Respiratory: Negative.   Cardiovascular: Negative.   All other systems reviewed and are negative.    Physical Exam Updated Vital Signs BP (!) 145/90   Pulse (!) 41   Temp 97.8 F (36.6 C) (Oral)   Resp 11   Ht 5\' 10"  (1.778 m)   Wt 88.5 kg (195 lb)   SpO2 100%   BMI 27.98 kg/m   Vital signs normal except mild hypertension and prominent bradycardia which is chronic   Physical Exam  Constitutional: He is oriented to person, place,  and time. He appears well-developed and well-nourished.  Non-toxic appearance. He does not appear ill. No distress.  HENT:  Head: Normocephalic and atraumatic.  Right Ear: External ear normal.  Left Ear: External ear normal.  Nose: Nose normal. No mucosal edema or rhinorrhea.  Mouth/Throat: Oropharynx is clear and moist and mucous membranes are normal. No dental abscesses or uvula swelling.  Eyes: Pupils are equal, round, and reactive to light.  Conjunctivae and EOM are normal.  Neck: Normal range of motion and full passive range of motion without pain. Neck supple.  Cardiovascular: Regular rhythm and normal heart sounds. Bradycardia present. Exam reveals no gallop and no friction rub.  No murmur heard. Pulmonary/Chest: Effort normal and breath sounds normal. No respiratory distress. He has no wheezes. He has no rhonchi. He has no rales. He exhibits no tenderness and no crepitus.  Abdominal: Soft. Normal appearance and bowel sounds are normal. He exhibits no distension. There is no tenderness. There is no rebound and no guarding.  Musculoskeletal: Normal range of motion. He exhibits no edema or tenderness.  Moves all extremities well.  Patient has good dorsalis pedis pulses.  His popliteals are hard to feel only because of his bradycardia, same with the femoral pulses.  His feet are normal color with normal capillary refill.  The skin is normal in appearance without feeling cold to touch or being hot to touch.  He does have a old subungual hematoma underneath his left toenail where he states he was helping somebody move and they dropped a piece of furniture on his toe.  It looks old and looks like the toenail will probably fall off at some point.  Neurological: He is alert and oriented to person, place, and time. He has normal strength. No cranial nerve deficit.  Patient has no focal neurological weakness, he has no loss of strength in his lower extremities or upper extremities.  Skin: Skin is warm,  dry and intact. No rash noted. No erythema. No pallor.  Psychiatric: He has a normal mood and affect. His speech is normal and behavior is normal. His mood appears not anxious.  Nursing note and vitals reviewed.    ED Treatments / Results  Labs (all labs ordered are listed, but only abnormal results are displayed) Results for orders placed or performed during the hospital encounter of 03/01/18  Comprehensive metabolic panel  Result Value Ref Range   Sodium 141 135 - 145 mmol/L   Potassium 3.6 3.5 - 5.1 mmol/L   Chloride 107 101 - 111 mmol/L   CO2 25 22 - 32 mmol/L   Glucose, Bld 93 65 - 99 mg/dL   BUN 10 6 - 20 mg/dL   Creatinine, Ser 0.77 0.61 - 1.24 mg/dL   Calcium 9.1 8.9 - 10.3 mg/dL   Total Protein 6.2 (L) 6.5 - 8.1 g/dL   Albumin 3.6 3.5 - 5.0 g/dL   AST 15 15 - 41 U/L   ALT 10 (L) 17 - 63 U/L   Alkaline Phosphatase 63 38 - 126 U/L   Total Bilirubin 0.8 0.3 - 1.2 mg/dL   GFR calc non Af Amer >60 >60 mL/min   GFR calc Af Amer >60 >60 mL/min   Anion gap 9 5 - 15  Lipase, blood  Result Value Ref Range   Lipase 40 11 - 51 U/L  CBC with Differential  Result Value Ref Range   WBC 6.8 4.0 - 10.5 K/uL   RBC 4.61 4.22 - 5.81 MIL/uL   Hemoglobin 13.1 13.0 - 17.0 g/dL   HCT 40.0 39.0 - 52.0 %   MCV 86.8 78.0 - 100.0 fL   MCH 28.4 26.0 - 34.0 pg   MCHC 32.8 30.0 - 36.0 g/dL   RDW 13.7 11.5 - 15.5 %   Platelets 193 150 - 400 K/uL   Neutrophils Relative % 54 %   Neutro Abs 3.7 1.7 - 7.7 K/uL   Lymphocytes Relative 34 %  Lymphs Abs 2.3 0.7 - 4.0 K/uL   Monocytes Relative 9 %   Monocytes Absolute 0.6 0.1 - 1.0 K/uL   Eosinophils Relative 3 %   Eosinophils Absolute 0.2 0.0 - 0.7 K/uL   Basophils Relative 0 %   Basophils Absolute 0.0 0.0 - 0.1 K/uL  I-stat troponin, ED  Result Value Ref Range   Troponin i, poc 0.00 0.00 - 0.08 ng/mL   Comment 3           Laboratory interpretation all normal     EKG EKG Interpretation  Date/Time:  Wednesday March 01 2018 02:25:54  EDT Ventricular Rate:  41 PR Interval:    QRS Duration: 112 QT Interval:  480 QTC Calculation: 397 R Axis:   8 Text Interpretation:  Sinus bradycardia Borderline intraventricular conduction delay No significant change since last tracing 23 Jan 2018 Confirmed by Rolland Porter [phone]) on 03/01/2018 2:49:27 AM   Radiology Ct Cervical Spine Wo Contrast  Result Date: 03/01/2018 CLINICAL DATA:  Initial evaluation for chronic neck pain. EXAM: CT CERVICAL SPINE WITHOUT CONTRAST TECHNIQUE: Multidetector CT imaging of the cervical spine was performed without intravenous contrast. Multiplanar CT image reconstructions were also generated. COMPARISON:  Prior MRI from 12/24/2009. FINDINGS: Alignment: Straightening of the normal cervical lordosis. No listhesis. Skull base and vertebrae: Skull base intact. Normal C1-2 articulations are preserved. Dens is intact. Vertebral body heights are maintained. No acute fracture. No discrete lytic or blastic osseous lesions. Soft tissues and spinal canal: Soft tissues of the neck demonstrate no acute abnormality. No abnormal prevertebral edema. Visualized thyroid normal. Spinal canal within normal limits. Disc levels: Moderate degenerative intervertebral disc space narrowing present at C5-6 and C6-7. Multilevel facet arthropathy present, most notable at C2-3 on the right and C3-4 and C4-5 on the left. Right C2-3 facet is fused. Mild-to-moderate canal stenosis present at C4-5 through C6-7. Moderate to advanced bilateral foraminal narrowing at C4-5 on the left as well at C5-6 and C6-7 bilaterally. Upper chest: Visualized upper chest within normal limits. Visualized lung apices are clear. Other: None. IMPRESSION: 1. No acute abnormality within the cervical spine. 2. Moderate degenerative spondylolysis at C4-5 through C5-6 with resultant mild to moderate spinal stenosis. Moderate to advanced foraminal narrowing on the left at C4-5 and bilaterally at C5-6 and C6-7. Electronically Signed    By: Jeannine Boga M.D.   On: 03/01/2018 04:56    Procedures Procedures (including critical care time)  Medications Ordered in ED Medications  sodium chloride 0.9 % bolus 1,000 mL (1,000 mLs Intravenous New Bag/Given 03/01/18 0252)  sodium chloride 0.9 % bolus 500 mL (0 mLs Intravenous Stopped 03/01/18 0411)  ondansetron (ZOFRAN) injection 4 mg (4 mg Intravenous Given 03/01/18 0253)     Initial Impression / Assessment and Plan / ED Course  I have reviewed the triage vital signs and the nursing notes.  Pertinent labs & imaging results that were available during my care of the patient were reviewed by me and considered in my medical decision making (see chart for details).     Basic laboratory testing was done, IV fluids and IV nausea medicines were given.  Recheck at 4 AM patient states his headaches better, his chest pain is gone, he still has the burning sensation in his legs.  When I review his chart his last MRI of his cervical spine was 2011.  CT cervical spine was done tonight, MRI is not available, however I am  not quite sure what his paresthesias could be from.  He is wondering if it could be a vitamin deficiency, or related to his stomach problems he is having.  He does not have demonstrable weakness in his legs or evidence of a vascular problem.  Patient has an appointment with his GI doctor next week.  I will leave it to his primary care doctor to look into possible vitamin deficiency.  His neurosurgeon did a esophagram study on him in March due to his complaints of having difficulty swallowing while on the Protonix which was normal   Final Clinical Impressions(s) / ED Diagnoses   Final diagnoses:  Paresthesia    ED Discharge Orders    None     Plan discharge  Rolland Porter, MD, Barbette Or, MD 03/01/18 [phone]

## 2018-03-02 DIAGNOSIS — M47812 Spondylosis without myelopathy or radiculopathy, cervical region: Secondary | ICD-10-CM | POA: Diagnosis not present

## 2018-03-03 ENCOUNTER — Other Ambulatory Visit: Payer: Self-pay | Admitting: *Deleted

## 2018-03-03 NOTE — Patient Outreach (Signed)
[place] [facility]) Care Management  03/03/2018  Ronald Conner August 20, 1961 [id number]   Telephone Screen  Referral Date:  03/02/2018 Referral Source:  [facility] ED Census Reason for Referral:  6 or more ED visits in the past 6 months Insurance:  Medicare   Outreach Attempt:  Outreach attempt #1 to patient for telephone screening.  Male answered and stated patient not at home.  HIPAA compliant message left with return contact information.  Plan:  RN Health Coach will send unsuccessful outreach letter to patient.  RN Health Coach will make another outreach attempt to patient within 3-4 business days if no return call back from patient.   [place] [phone] [email or web address]

## 2018-03-07 ENCOUNTER — Other Ambulatory Visit: Payer: Self-pay | Admitting: *Deleted

## 2018-03-07 NOTE — Patient Outreach (Signed)
[place] [facility]) Care Management  03/07/2018  Ronald Conner 1961/09/04 [id number]   Telephone Screen  Referral Date:  03/02/2018 Referral Source:  [facility] ED Census Reason for Referral:  6 or more ED visits in the past 6 months Insurance:  Medicare   Outreach Attempt:  Received telephone call back from patient.  HIPAA verified with patient.  [facility] services discussed and reviewed with patient.  Patient declines screening process at this time.  States he has received [facility] pamphlets and information in the mail, needs to review it with his wife.  Patient states he is in between doctor's appointments right now and would like to call to place a self referral after he has reviewed the [facility] information.  Patient encouraged to contact [facility] in the future if he wished to proceed with services.  Plan:  RN Health Coach will close case based on patient declining services at this time.  RN Health Coach reviewed 24 hour Nurse Line information with patient.  [place] [phone] [name]

## 2018-03-07 NOTE — Patient Outreach (Signed)
[place] [facility]) Care Management  03/07/2018  NELLO CORRO 1961-05-03 [id number]   Telephone Screen  Referral Date:  03/02/2018 Referral Source:  [facility] ED Census Reason for Referral:  6 or more ED visits in the past 6 months Insurance:  Medicare   Outreach Attempt:  Outreach attempt #2 to patient for telephone screening. No answer. RN Health Coach left HIPAA compliant voicemail message along with contact information.  Plan:  RN Health Coach will make another outreach attempt to patient within 3-4 business days if no return call back from patient.  [place] [phone] [email or web address]

## 2018-03-08 DIAGNOSIS — Z1211 Encounter for screening for malignant neoplasm of colon: Secondary | ICD-10-CM | POA: Diagnosis not present

## 2018-03-08 DIAGNOSIS — R1084 Generalized abdominal pain: Secondary | ICD-10-CM | POA: Diagnosis not present

## 2018-03-08 DIAGNOSIS — K210 Gastro-esophageal reflux disease with esophagitis: Secondary | ICD-10-CM | POA: Diagnosis not present

## 2018-03-10 ENCOUNTER — Ambulatory Visit: Payer: Self-pay | Admitting: *Deleted

## 2018-03-15 NOTE — Progress Notes (Signed)
Cardiology Office Note    Date:  03/16/2018   ID:  KAMALI NEPHEW, DOB 05/23/1961, MRN [id number]  PCP:  Redmond School, MD  Cardiologist: Carlyle Dolly, MD    Chief Complaint  Patient presents with  . Follow-up    had a low heart rate scare; discuss medication changes since gallbladder removal    History of Present Illness:    KHRISTIAN PHILLIPPI is a 57 y.o. male with past medical history of HTN, GERD, and bradycardia who presents to the office today for follow-up from recent ED visits.  He was last examined by myself in 11/2017 and reported that his episodes of chest discomfort had resolved following his recent laparoscopic cholecystectomy. He denied any associated dyspnea or palpitations at that time and no further ischemic evaluation was pursued.  In the interim, he was evaluated to [facility] ED on 01/23/2018 for chest discomfort with associated dyspnea nausea. Reports his symptoms started when having a bowel movement. Pain was reproducible on palpation and overall felt to be atypical for a cardiac etiology as an initial and delta troponin values were negative and EKG showed no acute ischemic changes. Lipase and LFT's were also WNL.   Presented back to the ED on 03/01/2018 for evaluation of abdominal discomfort, nausea, diarrhea, right shoulder pain, and bilateral leg pain which started after consuming yogurt. He did report paresthesias along his legs and a CT spine was performed which showed moderate degenerative spondylolysis at C4-C5 through C5-C6 with mild to moderate spinal stenosis. It was recommended that he follow-up with his Neurosurgeon in the outpatient setting.  In talking with the patient today, he reports being evaluated by his Neurosurgeon recently and that his heart rate was in the 30's at the time of his visit. Noted associated lightheadedness and dizziness but denies any syncope. He has been following his heart rate at home since and says this is been stable in the  40's to 50's and he denies any recurrent symptoms.  No recent chest pain, dyspnea on exertion, orthopnea, PND, or lower extremity edema. He did reduce his Lisinopril from 20 mg daily to 5 mg daily due to BP being soft following a 40 pound weight loss since his gallbladder surgery in 10/2017. Says his weight loss is due to a poor appetite. BP is well controlled at 115/73 during today's visit.   Past Medical History:  Diagnosis Date  . Acid reflux   . Family history of adverse reaction to anesthesia    MOM- PONV  . GERD (gastroesophageal reflux disease) 10/03/2017  . Hypertension     Past Surgical History:  Procedure Laterality Date  . CHOLECYSTECTOMY N/A 11/07/2017   Procedure: LAPAROSCOPIC CHOLECYSTECTOMY;  Surgeon: Aviva Signs, MD;  Location: [facility] ORS;  Service: General;  Laterality: N/A;  . ESOPHAGOGASTRODUODENOSCOPY N/A 10/07/2017   Procedure: ESOPHAGOGASTRODUODENOSCOPY (EGD);  Surgeon: Rogene Houston, MD;  Location: [facility] ENDO SUITE;  Service: Endoscopy;  Laterality: N/A;  12:00pm  . GALLBLADDER SURGERY  11/07/2017  . NO PAST SURGERIES      Current Medications: Outpatient Medications Prior to Visit  Medication Sig Dispense Refill  . aspirin EC 81 MG tablet Take 81 mg by mouth daily as needed for mild pain.    Marland Kitchen HYDROcodone-acetaminophen (NORCO/VICODIN) 5-325 MG tablet Take 1 tablet by mouth every 6 (six) hours as needed for moderate pain. 25 tablet 0  . lisinopril (PRINIVIL,ZESTRIL) 20 MG tablet Take 1 tablet (20 mg total) by mouth daily. (Patient taking differently: Take 10 mg  by mouth daily. ) 90 tablet 3  . diphenhydrAMINE (BENADRYL) 25 MG tablet Take 1 tablet (25 mg total) by mouth every 6 (six) hours. (Patient not taking: Reported on 03/16/2018) 20 tablet 0  . Oxymetazoline HCl (AFRIN NASAL SPRAY NA) Place 1 spray into the nose daily as needed (stuffiness).    . pantoprazole (PROTONIX) 40 MG tablet Take 40 mg by mouth daily as needed (acid reflux).     . predniSONE (DELTASONE)  50 MG tablet 1 tablet PO daily (Patient not taking: Reported on 01/23/2018) 5 tablet 0   No facility-administered medications prior to visit.      Allergies:   Protonix [pantoprazole sodium]   Social History   Socioeconomic History  . Marital status: Married    Spouse name: Not on file  . Number of children: Not on file  . Years of education: Not on file  . Highest education level: Not on file  Occupational History  . Not on file  Social Needs  . Financial resource strain: Not on file  . Food insecurity:    Worry: Not on file    Inability: Not on file  . Transportation needs:    Medical: Not on file    Non-medical: Not on file  Tobacco Use  . Smoking status: Former Smoker    Types: Cigars    Last attempt to quit: 11/05/2007    Years since quitting: 10.3  . Smokeless tobacco: Former Systems developer    Types: Snuff    Quit date: 11/05/2015  . Tobacco comment: dips snuff for 25 years.cigar once or twicea year  Substance and Sexual Activity  . Alcohol use: No  . Drug use: No  . Sexual activity: Yes    Birth control/protection: None  Lifestyle  . Physical activity:    Days per week: Not on file    Minutes per session: Not on file  . Stress: Not on file  Relationships  . Social connections:    Talks on phone: Not on file    Gets together: Not on file    Attends religious service: Not on file    Active member of club or organization: Not on file    Attends meetings of clubs or organizations: Not on file    Relationship status: Not on file  Other Topics Concern  . Not on file  Social History Narrative  . Not on file     Family History:  The patient's family history includes Arrhythmia in his maternal grandmother and mother; Cataracts in his mother; Dementia in his paternal grandmother; Diabetes in his brother; Heart attack in his maternal grandmother; Heart attack (age of onset: 65) in his father; Hypertension in his brother and mother; Kidney failure in his brother; Leukemia in  his mother; Obesity in his brother; Peripheral Artery Disease in his maternal grandfather and mother; Stroke in his maternal grandfather.   Review of Systems:   Please see the history of present illness.     General:  No chills, fever, or night sweats. Positive for weight loss.  Cardiovascular:  No chest pain, dyspnea on exertion, edema, orthopnea, palpitations, paroxysmal nocturnal dyspnea. Dermatological: No rash, lesions/masses Respiratory: No cough, dyspnea Urologic: No hematuria, dysuria Abdominal:   No nausea, vomiting, diarrhea, bright red blood per rectum, melena, or hematemesis Neurologic:  No visual changes, wkns, changes in mental status. Positive for dizziness.   All other systems reviewed and are otherwise negative except as noted above.   Physical Exam:    VS:  BP 115/73   Pulse (!) 54   Ht 5\' 10"  (1.778 m)   Wt 187 lb (84.8 kg)   SpO2 98%   BMI 26.83 kg/m    General: Well developed, well nourished Caucasian male appearing in no acute distress. Head: Normocephalic, atraumatic, sclera non-icteric, no xanthomas, nares are without discharge.  Neck: No carotid bruits. JVD not elevated.  Lungs: Respirations regular and unlabored, without wheezes or rales.  Heart: Regular rhythm, bradycardiac rate. No S3 or S4.  No murmur, no rubs, or gallops appreciated. Abdomen: Soft, non-tender, non-distended with normoactive bowel sounds. No hepatomegaly. No rebound/guarding. No obvious abdominal masses. Msk:  Strength and tone appear normal for age. No joint deformities or effusions. Extremities: No clubbing or cyanosis. No lower extremity edema.  Distal pedal pulses are 2+ bilaterally. Neuro: Alert and oriented X 3. Moves all extremities spontaneously. No focal deficits noted. Psych:  Responds to questions appropriately with a normal affect. Skin: No rashes or lesions noted  Wt Readings from Last 3 Encounters:  03/16/18 187 lb (84.8 kg)  03/01/18 195 lb (88.5 kg)  01/23/18 195 lb  (88.5 kg)     Studies/Labs Reviewed:   EKG:  EKG is not ordered today. EKG from 03/01/2018 is reviewed which shows sinus bradycardia, HR 41, with no acute ST or T wave changes when compared to prior tracings.  Recent Labs: 05/06/2017: TSH 0.817 05/23/2017: Magnesium 2.0 03/01/2018: ALT 10; BUN 10; Creatinine, Ser 0.77; Hemoglobin 13.1; Platelets 193; Potassium 3.6; Sodium 141   Lipid Panel No results found for: CHOL, TRIG, HDL, CHOLHDL, VLDL, LDLCALC, LDLDIRECT  Additional studies/ records that were reviewed today include:   CXR: 01/23/2018 FINDINGS: Chest radiograph November 20, 2017 Cardiomediastinal silhouette is normal. No pleural effusions or focal consolidations. Trachea projects midline and there is no pneumothorax. Soft tissue planes and included osseous structures are non-suspicious. Surgical clips in the included right abdomen compatible with cholecystectomy. Confluence of osseous shadows projecting at LEFT posterior fifth rib, unchanged. Early degenerative change of the thoracic spine. Probable greater curvature of the stomach extending above the level of the diaphragm, increased from prior CT.  IMPRESSION: Stable examination: No acute cardiopulmonary process.  Potential ventral diaphragmatic hernia versus projectional artifact.  Assessment:    1. Bradycardia   2. Dizziness   3. Essential hypertension      Plan:   In order of problems listed above:  1. Bradycardia/ Dizziness - Reports that his heart rate was in the 30's at the time of a recent office visit with associated dizziness and weakness at that time. He does report he has been consuming significantly less fluids since his surgery in 10/2017 and when asked about the specific amount, says this is usually less than 20 ounces per day. - With his associated lightheadedness and dizziness at that time, will plan for a 48-hour Holter monitor to assess for any significant pauses or arrhythmias. I have encouraged  him to consume at least 40 to 60 ounces of fluid per day as it is possible his symptoms were secondary to dehydration. - Continue to avoid AV nodal blocking agents as his resting heart rate is in the 50's.  2. HTN - Was previously on Lisinopril 20 mg daily but he has self-reduced this to 5 mg daily (cutting the tablets into 1/4) in the setting of his recent weight loss. BP is well controlled at 115/73 during today's visit. Will provide an Rx for Lisinopril 5 mg daily. I advised him to continue to follow blood  pressure in the ambulatory setting as he may require further dose reduction or eventual discontinuation of the medication.   Medication Adjustments/Labs and Tests Ordered: Current medicines are reviewed at length with the patient today.  Concerns regarding medicines are outlined above.  Medication changes, Labs and Tests ordered today are listed in the Patient Instructions below. Patient Instructions  Medication Instructions:  Your physician recommends that you continue on your current medications as directed. Please refer to the Current Medication list given to you today. Take Lisinopril 5 mg Daily   Labwork: NONE   Testing/Procedures: NONE   Follow-Up: Your physician wants you to follow-up in: 6 months.  You will receive a reminder letter in the mail two months in advance. If you don't receive a letter, please call our office to schedule the follow-up appointment.  Any Other Special Instructions Will Be Listed Below (If Applicable).  If you need a refill on your cardiac medications before your next appointment, please call your pharmacy.  Thank you for choosing [facility]!    Signed, Erma Heritage, PA-C  03/16/2018 5:14 PM    [place] [address] [place], [place] [address] Phone: [phone]

## 2018-03-16 ENCOUNTER — Encounter: Payer: Self-pay | Admitting: Student

## 2018-03-16 ENCOUNTER — Ambulatory Visit (INDEPENDENT_AMBULATORY_CARE_PROVIDER_SITE_OTHER): Payer: Medicare Other | Admitting: Student

## 2018-03-16 VITALS — BP 115/73 | HR 54 | Ht 70.0 in | Wt 187.0 lb

## 2018-03-16 DIAGNOSIS — R42 Dizziness and giddiness: Secondary | ICD-10-CM

## 2018-03-16 DIAGNOSIS — I10 Essential (primary) hypertension: Secondary | ICD-10-CM

## 2018-03-16 DIAGNOSIS — R001 Bradycardia, unspecified: Secondary | ICD-10-CM | POA: Diagnosis not present

## 2018-03-16 MED ORDER — LISINOPRIL 5 MG PO TABS: 5.0000 mg | ORAL_TABLET | Freq: Every day | ORAL | 3 refills | Status: DC

## 2018-03-16 NOTE — Patient Instructions (Signed)
Medication Instructions:  Your physician recommends that you continue on your current medications as directed. Please refer to the Current Medication list given to you today. Take Lisinopril 5 mg Daily   Labwork: NONE   Testing/Procedures: NONE   Follow-Up: Your physician wants you to follow-up in: 6 months.  You will receive a reminder letter in the mail two months in advance. If you don't receive a letter, please call our office to schedule the follow-up appointment.   Any Other Special Instructions Will Be Listed Below (If Applicable).     If you need a refill on your cardiac medications before your next appointment, please call your pharmacy.  Thank you for choosing [facility]!

## 2018-03-20 ENCOUNTER — Ambulatory Visit (HOSPITAL_COMMUNITY)
Admission: RE | Admit: 2018-03-20 | Discharge: 2018-03-20 | Disposition: A | Discharge status: Home / Self Care | Payer: Medicare Other | Source: Ambulatory Visit | Attending: Internal Medicine | Admitting: Internal Medicine

## 2018-03-20 DIAGNOSIS — I491 Atrial premature depolarization: Secondary | ICD-10-CM | POA: Diagnosis not present

## 2018-03-20 DIAGNOSIS — R001 Bradycardia, unspecified: Secondary | ICD-10-CM | POA: Diagnosis not present

## 2018-03-20 DIAGNOSIS — R42 Dizziness and giddiness: Secondary | ICD-10-CM | POA: Diagnosis not present

## 2018-03-20 DIAGNOSIS — I493 Ventricular premature depolarization: Secondary | ICD-10-CM | POA: Insufficient documentation

## 2018-03-23 DIAGNOSIS — K635 Polyp of colon: Secondary | ICD-10-CM | POA: Diagnosis not present

## 2018-03-23 DIAGNOSIS — Z1211 Encounter for screening for malignant neoplasm of colon: Secondary | ICD-10-CM | POA: Diagnosis not present

## 2018-03-23 DIAGNOSIS — K5730 Diverticulosis of large intestine without perforation or abscess without bleeding: Secondary | ICD-10-CM | POA: Diagnosis not present

## 2018-03-23 DIAGNOSIS — D126 Benign neoplasm of colon, unspecified: Secondary | ICD-10-CM | POA: Diagnosis not present

## 2018-03-24 ENCOUNTER — Telehealth: Payer: Self-pay | Admitting: *Deleted

## 2018-03-24 NOTE — Telephone Encounter (Signed)
Called patient with test results. No answer. Left message to call back.  

## 2018-03-24 NOTE — Telephone Encounter (Signed)
-----   Message from Erma Heritage, [place] sent at 03/24/2018  4:42 PM EDT ----- Please let the patient know that his Holter monitor showed normal sinus rhythm with episodes of PAC's and PVC's. We can use medications to treat these but with his prior episodes of bradycardia, would not initiate at this time. No episodes of significant bradycardia noted while wearing the monitor. Thank you.

## 2018-03-26 ENCOUNTER — Other Ambulatory Visit: Payer: Self-pay

## 2018-03-26 ENCOUNTER — Emergency Department (HOSPITAL_COMMUNITY)
Admission: EM | Admit: 2018-03-26 | Discharge: 2018-03-27 | Disposition: A | Discharge status: Home / Self Care | Payer: Medicare Other | Attending: Emergency Medicine | Admitting: Emergency Medicine

## 2018-03-26 ENCOUNTER — Encounter (HOSPITAL_COMMUNITY): Payer: Self-pay | Admitting: *Deleted

## 2018-03-26 DIAGNOSIS — M542 Cervicalgia: Secondary | ICD-10-CM | POA: Diagnosis not present

## 2018-03-26 DIAGNOSIS — R52 Pain, unspecified: Secondary | ICD-10-CM | POA: Diagnosis not present

## 2018-03-26 DIAGNOSIS — I10 Essential (primary) hypertension: Secondary | ICD-10-CM | POA: Insufficient documentation

## 2018-03-26 DIAGNOSIS — M62838 Other muscle spasm: Secondary | ICD-10-CM | POA: Insufficient documentation

## 2018-03-26 DIAGNOSIS — R600 Localized edema: Secondary | ICD-10-CM | POA: Insufficient documentation

## 2018-03-26 DIAGNOSIS — R1310 Dysphagia, unspecified: Secondary | ICD-10-CM | POA: Insufficient documentation

## 2018-03-26 DIAGNOSIS — Z87891 Personal history of nicotine dependence: Secondary | ICD-10-CM | POA: Diagnosis not present

## 2018-03-26 DIAGNOSIS — R220 Localized swelling, mass and lump, head: Secondary | ICD-10-CM | POA: Diagnosis not present

## 2018-03-26 DIAGNOSIS — R001 Bradycardia, unspecified: Secondary | ICD-10-CM | POA: Diagnosis not present

## 2018-03-26 NOTE — ED Triage Notes (Signed)
Pt brought in by [facility] for c/o possible allergic reaction; pt c/o feeling of knot in his throat; pt c/o numbness/burning to lips

## 2018-03-27 LAB — BASIC METABOLIC PANEL
Anion gap: 9 (ref 5–15)
BUN: 9 mg/dL (ref 6–20)
CO2: 27 mmol/L (ref 22–32)
Calcium: 9 mg/dL (ref 8.9–10.3)
Chloride: 105 mmol/L (ref 101–111)
Creatinine, Ser: 0.83 mg/dL (ref 0.61–1.24)
GFR calc Af Amer: >60 mL/min (ref >60)
GLUCOSE: 89 mg/dL (ref 65–99)
POTASSIUM: 3.8 mmol/L (ref 3.5–5.1)
Sodium: 141 mmol/L (ref 135–145)

## 2018-03-27 LAB — CBC WITH DIFFERENTIAL/PLATELET
Basophils Absolute: 0 10*3/uL (ref 0.0–0.1)
Basophils Relative: 0 %
EOS PCT: 4 %
Eosinophils Absolute: 0.3 10*3/uL (ref 0.0–0.7)
HCT: 42.9 % (ref 39.0–52.0)
Hemoglobin: 14.3 g/dL (ref 13.0–17.0)
LYMPHS ABS: 2.5 10*3/uL (ref 0.7–4.0)
LYMPHS PCT: 33 %
MCH: 29.2 pg (ref 26.0–34.0)
MCHC: 33.3 g/dL (ref 30.0–36.0)
MCV: 87.6 fL (ref 78.0–100.0)
MONO ABS: 0.6 10*3/uL (ref 0.1–1.0)
Monocytes Relative: 8 %
Neutro Abs: 4.1 10*3/uL (ref 1.7–7.7)
Neutrophils Relative %: 55 %
PLATELETS: 193 10*3/uL (ref 150–400)
RBC: 4.9 MIL/uL (ref 4.22–5.81)
RDW: 13.3 % (ref 11.5–15.5)
WBC: 7.5 10*3/uL (ref 4.0–10.5)

## 2018-03-27 LAB — I-STAT TROPONIN, ED: Troponin i, poc: 0.02 ng/mL (ref 0.00–0.08)

## 2018-03-27 MED ORDER — PREDNISONE 10 MG (21) PO TBPK: ORAL_TABLET | Freq: Every day | ORAL | 0 refills | Status: DC

## 2018-03-27 MED ORDER — KETOROLAC TROMETHAMINE 60 MG/2ML IM SOLN
INTRAMUSCULAR | Status: AC
  Administered 2018-03-27: 30 mg
  Filled 2018-03-27: qty 2

## 2018-03-27 MED ORDER — CYCLOBENZAPRINE HCL 10 MG PO TABS: 10.0000 mg | ORAL_TABLET | Freq: Three times a day (TID) | ORAL | 0 refills | Status: DC | PRN

## 2018-03-27 MED ORDER — CYCLOBENZAPRINE HCL 10 MG PO TABS
10.0000 mg | ORAL_TABLET | Freq: Once | ORAL | Status: AC
  Administered 2018-03-27: 10 mg via ORAL
  Filled 2018-03-27: qty 1

## 2018-03-27 MED ORDER — DIPHENHYDRAMINE HCL 25 MG PO CAPS
50.0000 mg | ORAL_CAPSULE | Freq: Once | ORAL | Status: AC
  Administered 2018-03-27: 50 mg via ORAL
  Filled 2018-03-27: qty 2

## 2018-03-27 MED ORDER — KETOROLAC TROMETHAMINE 30 MG/ML IJ SOLN: 30.0000 mg | Freq: Once | INTRAMUSCULAR | Status: DC

## 2018-03-27 NOTE — ED Notes (Signed)
Pt reports recent endo with feeling of throat pain  Afterwards reported to endo   Tonight feeling of throat swelling and feeling of a knot in his throat and pain in his shoulder with movement   He also reports that his pulse is usually somewhere around 40  [place] on monitor

## 2018-03-27 NOTE — ED Provider Notes (Addendum)
[facility] EMERGENCY DEPARTMENT Provider Note   CSN: [id number] Arrival date & time: 03/26/18  2335     History   Chief Complaint Chief Complaint  Patient presents with  . Sore Throat    HPI Ronald Conner is a 57 y.o. male.  Patient presents to the ER from home by ambulance.  Patient reports that he was sitting at home when he suddenly started to have a sensation of burning and swelling of his lips.  He then noticed the pain on the left side of his neck.  Pain is sharp and constant.  He reports that there is a small "knot" present there.  Pain radiates down into the left shoulder.  He reports pain when he moves.  He has not had any shortness of breath.  He did notice that he had some difficulty swallowing, which prompted him to call EMS.     Past Medical History:  Diagnosis Date  . Acid reflux   . Family history of adverse reaction to anesthesia    MOM- PONV  . GERD (gastroesophageal reflux disease) 10/03/2017  . Hypertension     Patient Active Problem List   Diagnosis Date Noted  . Chronic cholecystitis   . GERD (gastroesophageal reflux disease) 10/03/2017  . Gastroesophageal reflux disease without esophagitis 10/03/2017    Past Surgical History:  Procedure Laterality Date  . CHOLECYSTECTOMY N/A 11/07/2017   Procedure: LAPAROSCOPIC CHOLECYSTECTOMY;  Surgeon: Aviva Signs, MD;  Location: [facility] ORS;  Service: General;  Laterality: N/A;  . ESOPHAGOGASTRODUODENOSCOPY N/A 10/07/2017   Procedure: ESOPHAGOGASTRODUODENOSCOPY (EGD);  Surgeon: Rogene Houston, MD;  Location: [facility] ENDO SUITE;  Service: Endoscopy;  Laterality: N/A;  12:00pm  . GALLBLADDER SURGERY  11/07/2017  . NO PAST SURGERIES          Home Medications    Prior to Admission medications   Medication Sig Start Date End Date Taking? Authorizing Provider  aspirin EC 81 MG tablet Take 81 mg by mouth daily as needed for mild pain.    [provider]  cyclobenzaprine (FLEXERIL) 10 MG tablet Take 1  tablet (10 mg total) by mouth 3 (three) times daily as needed for muscle spasms. 03/27/18   Orpah Greek, MD  HYDROcodone-acetaminophen (NORCO/VICODIN) 5-325 MG tablet Take 1 tablet by mouth every 6 (six) hours as needed for moderate pain. 11/07/17   Aviva Signs, MD  lisinopril (PRINIVIL,ZESTRIL) 5 MG tablet Take 1 tablet (5 mg total) by mouth daily. 03/16/18 06/14/18  Strader, Fransisco Hertz, PA-C  predniSONE (STERAPRED UNI-PAK 21 TAB) 10 MG (21) TBPK tablet Take by mouth daily. Take 6 tabs by mouth daily  for 1 days, then 5 tabs for 1 days, then 4 tabs for 1 days, then 3 tabs for 1 days, 2 tabs for 1 days, then 1 tab by mouth daily for 1 days 03/27/18   Orpah Greek, MD    Family History Family History  Problem Relation Age of Onset  . Arrhythmia Mother        h/o DCCV  . Leukemia Mother   . Hypertension Mother   . Cataracts Mother   . Peripheral Artery Disease Mother   . Heart attack Father 31  . Kidney failure Brother        on dialysis  . Hypertension Brother   . Arrhythmia Maternal Grandmother   . Heart attack Maternal Grandmother   . Stroke Maternal Grandfather   . Peripheral Artery Disease Maternal Grandfather  double amputee  . Dementia Paternal Grandmother   . Diabetes Brother   . Obesity Brother     Social History Social History   Tobacco Use  . Smoking status: Former Smoker    Types: Cigars    Last attempt to quit: 11/05/2007    Years since quitting: 10.3  . Smokeless tobacco: Former Systems developer    Types: Snuff    Quit date: 11/05/2015  . Tobacco comment: dips snuff for 25 years.cigar once or twicea year  Substance Use Topics  . Alcohol use: No  . Drug use: No     Allergies   Protonix [pantoprazole sodium]   Review of Systems Review of Systems  HENT: Positive for facial swelling and trouble swallowing.   Respiratory: Negative for shortness of breath.   All other systems reviewed and are negative.    Physical Exam Updated Vital  Signs BP 115/85   Pulse (!) 39   Temp 98.3 F (36.8 C) (Oral)   Resp 10   Ht 5\' 10"  (1.778 m)   Wt 84.8 kg (187 lb)   SpO2 98%   BMI 26.83 kg/m   Physical Exam  Constitutional: He is oriented to person, place, and time. He appears well-developed and well-nourished. No distress.  HENT:  Head: Normocephalic and atraumatic.  Right Ear: Hearing normal.  Left Ear: Hearing normal.  Nose: Nose normal.  Mouth/Throat: Oropharynx is clear and moist and mucous membranes are normal.  Eyes: Pupils are equal, round, and reactive to light. Conjunctivae and EOM are normal.  Neck: Normal range of motion. Neck supple.    Cardiovascular: Regular rhythm, S1 normal and S2 normal. Exam reveals no gallop and no friction rub.  No murmur heard. Pulmonary/Chest: Effort normal and breath sounds normal. No respiratory distress. He exhibits no tenderness.  Abdominal: Soft. Normal appearance and bowel sounds are normal. There is no hepatosplenomegaly. There is no tenderness. There is no rebound, no guarding, no tenderness at McBurney's point and negative Murphy's sign. No hernia.  Musculoskeletal: Normal range of motion.  Neurological: He is alert and oriented to person, place, and time. He has normal strength. No cranial nerve deficit or sensory deficit. Coordination normal. GCS eye subscore is 4. GCS verbal subscore is 5. GCS motor subscore is 6.  Skin: Skin is warm, dry and intact. No rash noted. No cyanosis.  Psychiatric: He has a normal mood and affect. His speech is normal and behavior is normal. Thought content normal.  Nursing note and vitals reviewed.    ED Treatments / Results  Labs (all labs ordered are listed, but only abnormal results are displayed) Labs Reviewed  CBC WITH DIFFERENTIAL/PLATELET  BASIC METABOLIC PANEL  I-STAT TROPONIN, ED    EKG EKG Interpretation  Date/Time:  Monday Mar 27 2018 00:52:21 EDT Ventricular Rate:  41 PR Interval:    QRS Duration: 104 QT  Interval:  470 QTC Calculation: 389 R Axis:     Text Interpretation:  Junctional rhythm RSR' in V1 or V2, probably normal variant Confirmed by Orpah Greek [id number]) on 03/27/2018 12:57:30 AM   Radiology No results found.  Procedures Procedures (including critical care time)  Medications Ordered in ED Medications  diphenhydrAMINE (BENADRYL) capsule 50 mg (has no administration in time range)  ketorolac (TORADOL) 30 MG/ML injection 30 mg (has no administration in time range)  cyclobenzaprine (FLEXERIL) tablet 10 mg (has no administration in time range)     Initial Impression / Assessment and Plan / ED Course  I have reviewed  the triage vital signs and the nursing notes.  Pertinent labs & imaging results that were available during my care of the patient were reviewed by me and considered in my medical decision making (see chart for details).     Patient presents to the emergency department with multiple complaints.  He reports that he had sudden onset of a burning sensation of his lips and then felt like his lips were swollen.  He then noticed pain on the left side of his neck and he feels like there is a knot there.  I do feel some spasm of the left paraspinal muscles but no discrete masses or lymphadenopathy present.  The area is tender to the touch and he has pain with movement of the head from side to side or movement of the left shoulder.  This is consistent with spasm of the paraspinal muscles.  Cardiac evaluation unremarkable.  He is bradycardic, but reports that this is chronic for him.    Patient does not have any unilateral neurologic symptoms.  Examination not consistent with radiculopathy.  EMS had been concerned about the possibility of allergic reaction.  He never developed any angioedema, rash.  I do not think that this was allergic in nature.  He was somewhat anxious at arrival, likely secondary to the pain he was experiencing.  Will, however, provide treatment for  possible allergic reaction.  He does take lisinopril, but again, no angioedema noted.  His blood pressure is normal and he is weaning off lisinopril after significant weight loss, therefore counseled to stop lisinopril. Patient will be treated for torticollis/muscle strain and have follow-up with primary doctor.  Final Clinical Impressions(s) / ED Diagnoses   Final diagnoses:  Cervical paraspinal muscle spasm    ED Discharge Orders        Ordered    predniSONE (STERAPRED UNI-PAK 21 TAB) 10 MG (21) TBPK tablet  Daily     03/27/18 0216    cyclobenzaprine (FLEXERIL) 10 MG tablet  3 times daily PRN     03/27/18 0216       Orpah Greek, MD 03/27/18 0217    Orpah Greek, MD 03/27/18 [phone]

## 2018-03-28 ENCOUNTER — Other Ambulatory Visit: Payer: Self-pay | Admitting: *Deleted

## 2018-03-28 DIAGNOSIS — D126 Benign neoplasm of colon, unspecified: Secondary | ICD-10-CM | POA: Diagnosis not present

## 2018-03-28 DIAGNOSIS — K635 Polyp of colon: Secondary | ICD-10-CM | POA: Diagnosis not present

## 2018-03-28 NOTE — Patient Outreach (Signed)
[facility]) Care Management  03/28/2018  BOLTON CANUPP November 14, 1961 [id number]  Mr. KASSIDY FRANKSON is referred again to [facility] subsequent to a recent ED visit making his total ED visits in 6 months = 9. I was unable to reach Mr. Geisen today and note that he has declined our services on several occassions dating back to November 2018.   Plan: I will make another attempt to reach Mr. Fabiano by phone over the next week.    [facility]  [phone]

## 2018-03-31 DIAGNOSIS — M542 Cervicalgia: Secondary | ICD-10-CM | POA: Diagnosis not present

## 2018-03-31 DIAGNOSIS — I10 Essential (primary) hypertension: Secondary | ICD-10-CM | POA: Diagnosis not present

## 2018-03-31 DIAGNOSIS — Z6827 Body mass index (BMI) 27.0-27.9, adult: Secondary | ICD-10-CM | POA: Diagnosis not present

## 2018-03-31 DIAGNOSIS — K219 Gastro-esophageal reflux disease without esophagitis: Secondary | ICD-10-CM | POA: Diagnosis not present

## 2018-04-02 DIAGNOSIS — M79603 Pain in arm, unspecified: Secondary | ICD-10-CM | POA: Diagnosis not present

## 2018-04-03 ENCOUNTER — Other Ambulatory Visit: Payer: Self-pay | Admitting: *Deleted

## 2018-04-03 NOTE — Patient Outreach (Signed)
[facility]) Care Management  04/03/2018  DARRIE MACMILLAN 30-Mar-1961 [id number]  Mr. ELRIDGE STEMM is referred again to [facility] subsequent to a recent ED visit making his total ED visits in 6 months = 9. We have had difficulty maintaining contact with Mr. Arizmendi after several referrals and when contacted, Mr. Coin has declined our services on several occassions dating back to November 2018. I have been unable to speak with Mr. Matheson by phone and he has not responded to correspondence offering our assistance sent to him as recently as 03/10/18.  I reached out to [facility] and left a message and sent a letter to Mr. Meek primary care provider and patient care coordinator to discuss the information re: Mr. Petrich referral status as outlined above.   Plan: I will follow up with the patient care coordinator at [facility] re: Mr. Bowland care management needs.    [facility]  [phone]

## 2018-04-07 DIAGNOSIS — M542 Cervicalgia: Secondary | ICD-10-CM | POA: Diagnosis not present

## 2018-04-18 ENCOUNTER — Other Ambulatory Visit: Payer: Self-pay | Admitting: *Deleted

## 2018-04-18 NOTE — Patient Outreach (Signed)
[facility]) Care Management  04/18/2018  [name] [birth year] [id number]  Ronald Conner is referred again to [facility] subsequent to a recent ED visit making his total ED visits in 6 months = 9. We have been either unable to make contact or he has declined our services on several occassions dating back to November 2018 and most recently we have been unable to reach him on several attempts after a referral subsequent to an ED visit on 03/27/18.   Plan: I will not open Ronald Conner case to [facility] services.    [facility]  [phone]

## 2018-04-21 DIAGNOSIS — K5900 Constipation, unspecified: Secondary | ICD-10-CM | POA: Diagnosis not present

## 2018-04-21 DIAGNOSIS — Z6827 Body mass index (BMI) 27.0-27.9, adult: Secondary | ICD-10-CM | POA: Diagnosis not present

## 2018-04-21 DIAGNOSIS — I10 Essential (primary) hypertension: Secondary | ICD-10-CM | POA: Diagnosis not present

## 2018-04-21 DIAGNOSIS — M542 Cervicalgia: Secondary | ICD-10-CM | POA: Diagnosis not present

## 2018-05-04 DIAGNOSIS — M542 Cervicalgia: Secondary | ICD-10-CM | POA: Diagnosis not present

## 2018-05-04 DIAGNOSIS — K5900 Constipation, unspecified: Secondary | ICD-10-CM | POA: Diagnosis not present

## 2018-05-04 DIAGNOSIS — I10 Essential (primary) hypertension: Secondary | ICD-10-CM | POA: Diagnosis not present

## 2018-05-04 DIAGNOSIS — K649 Unspecified hemorrhoids: Secondary | ICD-10-CM | POA: Diagnosis not present

## 2018-05-04 DIAGNOSIS — Z6827 Body mass index (BMI) 27.0-27.9, adult: Secondary | ICD-10-CM | POA: Diagnosis not present

## 2018-05-04 DIAGNOSIS — Z6828 Body mass index (BMI) 28.0-28.9, adult: Secondary | ICD-10-CM | POA: Diagnosis not present

## 2018-05-04 DIAGNOSIS — K219 Gastro-esophageal reflux disease without esophagitis: Secondary | ICD-10-CM | POA: Diagnosis not present

## 2018-05-08 DIAGNOSIS — M542 Cervicalgia: Secondary | ICD-10-CM | POA: Diagnosis not present

## 2018-05-08 DIAGNOSIS — K219 Gastro-esophageal reflux disease without esophagitis: Secondary | ICD-10-CM | POA: Diagnosis not present

## 2018-05-08 DIAGNOSIS — Z6828 Body mass index (BMI) 28.0-28.9, adult: Secondary | ICD-10-CM | POA: Diagnosis not present

## 2018-05-08 DIAGNOSIS — R339 Retention of urine, unspecified: Secondary | ICD-10-CM | POA: Diagnosis not present

## 2018-05-08 DIAGNOSIS — Z0000 Encounter for general adult medical examination without abnormal findings: Secondary | ICD-10-CM | POA: Diagnosis not present

## 2018-05-08 DIAGNOSIS — I10 Essential (primary) hypertension: Secondary | ICD-10-CM | POA: Diagnosis not present

## 2018-05-17 DIAGNOSIS — R339 Retention of urine, unspecified: Secondary | ICD-10-CM | POA: Diagnosis not present

## 2018-05-17 DIAGNOSIS — K5900 Constipation, unspecified: Secondary | ICD-10-CM | POA: Diagnosis not present

## 2018-05-17 DIAGNOSIS — Z0000 Encounter for general adult medical examination without abnormal findings: Secondary | ICD-10-CM | POA: Diagnosis not present

## 2018-05-17 DIAGNOSIS — M542 Cervicalgia: Secondary | ICD-10-CM | POA: Diagnosis not present

## 2018-05-17 DIAGNOSIS — Z6828 Body mass index (BMI) 28.0-28.9, adult: Secondary | ICD-10-CM | POA: Diagnosis not present

## 2018-05-17 DIAGNOSIS — K219 Gastro-esophageal reflux disease without esophagitis: Secondary | ICD-10-CM | POA: Diagnosis not present

## 2018-05-17 DIAGNOSIS — Z6827 Body mass index (BMI) 27.0-27.9, adult: Secondary | ICD-10-CM | POA: Diagnosis not present

## 2018-05-17 DIAGNOSIS — I10 Essential (primary) hypertension: Secondary | ICD-10-CM | POA: Diagnosis not present

## 2018-05-17 DIAGNOSIS — K649 Unspecified hemorrhoids: Secondary | ICD-10-CM | POA: Diagnosis not present

## 2018-05-26 ENCOUNTER — Emergency Department (HOSPITAL_COMMUNITY)
Admission: EM | Admit: 2018-05-26 | Discharge: 2018-05-26 | Disposition: A | Discharge status: Home / Self Care | Payer: Medicare Other | Attending: Emergency Medicine | Admitting: Emergency Medicine

## 2018-05-26 ENCOUNTER — Encounter (HOSPITAL_COMMUNITY): Payer: Self-pay

## 2018-05-26 ENCOUNTER — Emergency Department (HOSPITAL_COMMUNITY): Payer: Medicare Other

## 2018-05-26 ENCOUNTER — Other Ambulatory Visit: Payer: Self-pay

## 2018-05-26 DIAGNOSIS — Z7982 Long term (current) use of aspirin: Secondary | ICD-10-CM | POA: Diagnosis not present

## 2018-05-26 DIAGNOSIS — I10 Essential (primary) hypertension: Secondary | ICD-10-CM | POA: Insufficient documentation

## 2018-05-26 DIAGNOSIS — R079 Chest pain, unspecified: Secondary | ICD-10-CM | POA: Diagnosis not present

## 2018-05-26 DIAGNOSIS — Z87891 Personal history of nicotine dependence: Secondary | ICD-10-CM | POA: Diagnosis not present

## 2018-05-26 DIAGNOSIS — J45909 Unspecified asthma, uncomplicated: Secondary | ICD-10-CM | POA: Diagnosis not present

## 2018-05-26 DIAGNOSIS — R0602 Shortness of breath: Secondary | ICD-10-CM | POA: Diagnosis not present

## 2018-05-26 DIAGNOSIS — R0789 Other chest pain: Secondary | ICD-10-CM | POA: Insufficient documentation

## 2018-05-26 DIAGNOSIS — R072 Precordial pain: Secondary | ICD-10-CM | POA: Diagnosis not present

## 2018-05-26 LAB — HEPATIC FUNCTION PANEL
ALBUMIN: 4.1 g/dL (ref 3.5–5.0)
ALK PHOS: 76 U/L (ref 38–126)
ALT: 14 U/L (ref 0–44)
AST: 19 U/L (ref 15–41)
Bilirubin, Direct: 0.1 mg/dL (ref 0.0–0.2)
Indirect Bilirubin: 0.8 mg/dL (ref 0.3–0.9)
Total Bilirubin: 0.9 mg/dL (ref 0.3–1.2)
Total Protein: 7.1 g/dL (ref 6.5–8.1)

## 2018-05-26 LAB — TROPONIN I

## 2018-05-26 LAB — BASIC METABOLIC PANEL
ANION GAP: 9 (ref 5–15)
BUN: 10 mg/dL (ref 6–20)
CO2: 28 mmol/L (ref 22–32)
Calcium: 9 mg/dL (ref 8.9–10.3)
Chloride: 105 mmol/L (ref 98–111)
Creatinine, Ser: 0.82 mg/dL (ref 0.61–1.24)
GFR calc Af Amer: >60 mL/min (ref >60)
Glucose, Bld: 89 mg/dL (ref 70–99)
POTASSIUM: 3.8 mmol/L (ref 3.5–5.1)
SODIUM: 142 mmol/L (ref 135–145)

## 2018-05-26 LAB — CBC
HEMATOCRIT: 48.3 % (ref 39.0–52.0)
HEMOGLOBIN: 16.4 g/dL (ref 13.0–17.0)
MCH: 30.1 pg (ref 26.0–34.0)
MCHC: 34 g/dL (ref 30.0–36.0)
MCV: 88.6 fL (ref 78.0–100.0)
Platelets: 227 10*3/uL (ref 150–400)
RBC: 5.45 MIL/uL (ref 4.22–5.81)
RDW: 12.9 % (ref 11.5–15.5)
WBC: 6.2 10*3/uL (ref 4.0–10.5)

## 2018-05-26 MED ORDER — FAMOTIDINE 20 MG PO TABS
20.0000 mg | ORAL_TABLET | Freq: Once | ORAL | Status: AC
  Administered 2018-05-26: 20 mg via ORAL
  Filled 2018-05-26: qty 1

## 2018-05-26 MED ORDER — FAMOTIDINE 20 MG PO TABS: 20.0000 mg | ORAL_TABLET | Freq: Two times a day (BID) | ORAL | 0 refills | Status: DC

## 2018-05-26 NOTE — ED Provider Notes (Signed)
[facility] Provider Note   CSN: [id number] Arrival date & time: 05/26/18  1412     History   Chief Complaint Chief Complaint  Patient presents with  . Chest Pain    HPI [facility] is a 57 y.o. male.  Patient complains of chest discomfort earlier today.  He does not have any pain now.  No sweating no shortness of breath  The history is provided by the patient. No language interpreter was used.  Chest Pain   This is a new problem. The current episode started 1 to 2 hours ago. The problem occurs rarely. The problem has been resolved. Associated with: Unknown. The pain is present in the substernal region. The pain is at a severity of 5/10. The pain is moderate. The quality of the pain is described as dull. The pain does not radiate. Pertinent negatives include no abdominal pain, no back pain, no cough and no headaches.  Pertinent negatives for past medical history include no seizures.    Past Medical History:  Diagnosis Date  . Acid reflux   . Family history of adverse reaction to anesthesia    MOM- PONV  . GERD (gastroesophageal reflux disease) 10/03/2017  . Hypertension     Patient Active Problem List   Diagnosis Date Noted  . Chronic cholecystitis   . GERD (gastroesophageal reflux disease) 10/03/2017  . Gastroesophageal reflux disease without esophagitis 10/03/2017    Past Surgical History:  Procedure Laterality Date  . CHOLECYSTECTOMY N/A 11/07/2017   Procedure: LAPAROSCOPIC CHOLECYSTECTOMY;  Surgeon: Aviva Signs, MD;  Location: [facility];  Service: General;  Laterality: N/A;  . COLONOSCOPY    . ESOPHAGOGASTRODUODENOSCOPY N/A 10/07/2017   Procedure: ESOPHAGOGASTRODUODENOSCOPY (EGD);  Surgeon: Rogene Houston, MD;  Location: [facility];  Service: Endoscopy;  Laterality: N/A;  12:00pm  . GALLBLADDER SURGERY  11/07/2017  . NO PAST SURGERIES          Home Medications    Prior to Admission medications   Medication Sig Start Date End  Date Taking? Authorizing Provider  aspirin EC 81 MG tablet Take 81 mg by mouth daily as needed for mild pain.   Yes [provider]  cyclobenzaprine (FLEXERIL) 10 MG tablet Take 1 tablet (10 mg total) by mouth 3 (three) times daily as needed for muscle spasms. 03/27/18  Yes Pollina, Gwenyth Allegra, MD  HYDROcodone-acetaminophen (NORCO/VICODIN) 5-325 MG tablet Take 1 tablet by mouth every 6 (six) hours as needed for moderate pain. 11/07/17  Yes Aviva Signs, MD  losartan (COZAAR) 25 MG tablet Take 25 mg by mouth daily. 05/17/18  Yes [provider]  famotidine (PEPCID) 20 MG tablet Take 1 tablet (20 mg total) by mouth 2 (two) times daily. 05/26/18   Milton Ferguson, MD  lisinopril (PRINIVIL,ZESTRIL) 5 MG tablet Take 1 tablet (5 mg total) by mouth daily. Patient not taking: Reported on 05/26/2018 03/16/18 06/14/18  Ahmed Prima, Fransisco Hertz, PA-C  predniSONE (STERAPRED UNI-PAK 21 TAB) 10 MG (21) TBPK tablet Take by mouth daily. Take 6 tabs by mouth daily  for 1 days, then 5 tabs for 1 days, then 4 tabs for 1 days, then 3 tabs for 1 days, 2 tabs for 1 days, then 1 tab by mouth daily for 1 days Patient not taking: Reported on 05/26/2018 03/27/18   Orpah Greek, MD    Family History Family History  Problem Relation Age of Onset  . Arrhythmia Mother        h/o DCCV  .  Leukemia Mother   . Hypertension Mother   . Cataracts Mother   . Peripheral Artery Disease Mother   . Heart attack Father 22  . Kidney failure Brother        on dialysis  . Hypertension Brother   . Arrhythmia Maternal Grandmother   . Heart attack Maternal Grandmother   . Stroke Maternal Grandfather   . Peripheral Artery Disease Maternal Grandfather        double amputee  . Dementia Paternal Grandmother   . Diabetes Brother   . Obesity Brother     Social History Social History   Tobacco Use  . Smoking status: Former Smoker    Types: Cigars    Last attempt to quit: 11/05/2007    Years since quitting: 10.5  .  Smokeless tobacco: Former Systems developer    Types: Snuff    Quit date: 11/05/2015  . Tobacco comment: dips snuff for 25 years.cigar once or twicea year  Substance Use Topics  . Alcohol use: No  . Drug use: No     Allergies   Clonidine derivatives; Lisinopril; and Protonix [pantoprazole sodium]   Review of Systems Review of Systems  Constitutional: Negative for appetite change and fatigue.  HENT: Negative for congestion, ear discharge and sinus pressure.   Eyes: Negative for discharge.  Respiratory: Negative for cough.   Cardiovascular: Positive for chest pain.  Gastrointestinal: Negative for abdominal pain and diarrhea.  Genitourinary: Negative for frequency and hematuria.  Musculoskeletal: Negative for back pain.  Skin: Negative for rash.  Neurological: Negative for seizures and headaches.  Psychiatric/Behavioral: Negative for hallucinations.     Physical Exam Updated Vital Signs BP (!) 135/93   Pulse (!) 45   Resp 10   Wt 84.8 kg (187 lb)   SpO2 99%   BMI 26.83 kg/m   Physical Exam  Constitutional: He is oriented to person, place, and time. He appears well-developed.  HENT:  Head: Normocephalic.  Eyes: Conjunctivae and EOM are normal. No scleral icterus.  Neck: Neck supple. No thyromegaly present.  Cardiovascular: Normal rate and regular rhythm. Exam reveals no gallop and no friction rub.  No murmur heard. Pulmonary/Chest: No stridor. He has no wheezes. He has no rales. He exhibits no tenderness.  Abdominal: He exhibits no distension. There is no tenderness. There is no rebound.  Musculoskeletal: Normal range of motion. He exhibits no edema.  Lymphadenopathy:    He has no cervical adenopathy.  Neurological: He is oriented to person, place, and time. He exhibits normal muscle tone. Coordination normal.  Skin: No rash noted. No erythema.  Psychiatric: He has a normal mood and affect. His behavior is normal.     ED Treatments / Results  Labs (all labs ordered are  listed, but only abnormal results are displayed) Labs Reviewed  BASIC METABOLIC PANEL  CBC  TROPONIN I  HEPATIC FUNCTION PANEL  TROPONIN I    EKG EKG Interpretation  Date/Time:  Friday May 26 2018 14:22:05 EDT Ventricular Rate:  53 PR Interval:    QRS Duration: 117 QT Interval:  434 QTC Calculation: 408 R Axis:   15 Text Interpretation:  Undetermined rhythm Nonspecific intraventricular conduction delay Confirmed by Merrily Pew [phone]) on 05/26/2018 2:42:41 PM   Radiology Dg Chest 2 View  Result Date: 05/26/2018 CLINICAL DATA:  57 year old male with a history of chest pain and shortness of breath EXAM: CHEST - 2 VIEW COMPARISON:  01/23/2018 FINDINGS: Cardiomediastinal silhouette unchanged in size and contour. No evidence of central vascular congestion.  No pneumothorax. No pleural effusion. No confluent airspace disease. No interlobular septal thickening. No displaced fracture. IMPRESSION: No radiographic evidence of acute cardiopulmonary disease Electronically Signed   By: Corrie Mckusick D.O.   On: 05/26/2018 15:49    Procedures Procedures (including critical care time)  Medications Ordered in ED Medications  famotidine (PEPCID) tablet 20 mg (has no administration in time range)     Initial Impression / Assessment and Plan / ED Course  I have reviewed the triage vital signs and the nursing notes.  Pertinent labs & imaging results that were available during my care of the patient were reviewed by me and considered in my medical decision making (see chart for details).     With atypical chest pain.  He had 2 normal troponins EKG shows no acute changes.  Chemistries and CBC unremarkable.  Chest x-ray normal.  Doubt coronary artery disease.  Patient will be placed on Zantac and he has a cardiologist that he was instructed to call the beginning of the week and get followed up for possible stress test Final Clinical Impressions(s) / ED Diagnoses   Final diagnoses:  Atypical  chest pain    ED Discharge Orders        Ordered    famotidine (PEPCID) 20 MG tablet  2 times daily     05/26/18 2001       Shakyra Mattera, MD 05/26/18 2003

## 2018-05-26 NOTE — Discharge Instructions (Addendum)
Follow-up with your cardiologist this week for recheck.  Return sooner if any problems

## 2018-05-26 NOTE — ED Triage Notes (Signed)
Pt brought in by EMS due to Cp. Pt reports that he was sitting and his chest started hurting. Pt reports pain was in the center of his chest and he was diaphoretic and light headed. Pt called 911 and took 3 baby aspirins

## 2018-05-26 NOTE — ED Notes (Signed)
Pt ambulated to BR

## 2018-05-30 DIAGNOSIS — I10 Essential (primary) hypertension: Secondary | ICD-10-CM | POA: Diagnosis not present

## 2018-05-30 DIAGNOSIS — Z6828 Body mass index (BMI) 28.0-28.9, adult: Secondary | ICD-10-CM | POA: Diagnosis not present

## 2018-05-30 DIAGNOSIS — K5900 Constipation, unspecified: Secondary | ICD-10-CM | POA: Diagnosis not present

## 2018-06-02 ENCOUNTER — Encounter: Payer: Self-pay | Admitting: Physician Assistant

## 2018-06-02 NOTE — Progress Notes (Signed)
Cardiology Office Note    Date:  06/06/2018  ID:  [facility], DOB 17-Mar-1961, MRN [id number] PCP:  Celene Squibb, MD  Cardiologist:  Carlyle Dolly, MD  Chief Complaint: f/u rhythm  History of Present Illness:  Ronald Conner is a 57 y.o. male with history of HTN, GERD, sinus bradycardia, junctional bradycardia, PVCs, PACs, and atypical chest pain who presents to the office today for follow-up from recent ED visits.  He previously established care with Dr. Harl Bowie in 04/2017 for bradycardia with HR chronically in the 50s-60s, with home monitor showing HR 40s prior to an ER visit. At the time, this was originally suspected to be pseudobradycardia due to NSR with PVCs noted in ER visit in the setting of hypokalemia (but has had actual documented brady since that time). He has a history of one type of chest pain resolved with cholecystectomy in the past, but has had more discomfort since that time. In 01/2018 he was seen in the [facility] ER with chest discomfort, dyspnea & nausea that started when having a bowel movement. Pain was reproducible on palpation and overall felt to be atypical for a cardiac etiology as troponins were negative and EKG showed no acute ischemic changes. He was seen back in the ER 03/01/18 for evaluation of abdominal discomfort, nausea, diarrhea, right shoulder pain, and bilateral leg pain. He did report paresthesias along his legs and a CT spine was performed which showed moderate degenerative spondylolysis at C4-C5 through C5-C6 with mild to moderate spinal stenosis. It was recommended that he follow-up with his neurosurgeon in the outpatient setting. He was seen back in the cardiology office 03/15/18 by Bernerd Pho because he'd been told his HR was in the 30s at time of neurology visit with associated lightheadedness and dizziness. She ordered a 48-hour Holter which showed NSR, sinus tach, sinus bradycardia, frequent PVCs and rare PACs with rare 3 beat atrial runs - average HR  49, lowest HR 32 (appeared sinus bradycardia but unclear how long, 10:30am on 03/22/18). Ventricular ectopy accounted for 1.72% of beats. There were no sustained arrhythmias. He was seen in the ED 03/2018 with cervical paraspinal muscle spasm, sensation of burning in his lips, and atypical rash. He was incidentally noted to be in junctional bradycardia with a HR of 41. This was felt chronic by EDP and he was discharged with care for his muscle strain. He was asked to discontinue his lisinopril in case it was causing the leg rash. He had no signs of angioedema on exam. He went to see primary care who placed him on clonidine but this caused further issues with HR as well as some chest pressure so he stopped it and was changed to losartan. He was seen in ED 05/26/18 with chest pain felt atypical. EKG showed SB except did show what looks like an ectopic atrial beat as well as a junctional beat in the middle. Troponin neg x 2, BMET, CBC, LFTS wnl. K was 3.8. Last TSH 04/2017 wnl, Mg 2.0 at that time.   He started Pepcid and has not really had chest discomfort since that time. He has noticed an occasional sense of mild head fogginess and dizziness since starting the losartan. He cut the dose into 1/2 twice a day and this seems to have helped. Blood pressure is well controlled at home and in the office today. He is not presently dizzy or near-syncopal. He used to run 5 miles a day but stopped when he hurt his back.  He does remain active in the yard and is generally able to do so without functional limitation.   Past Medical History:  Diagnosis Date  . Acid reflux   . Atypical chest pain   . Family history of adverse reaction to anesthesia    MOM- PONV  . GERD (gastroesophageal reflux disease) 10/03/2017  . Hypertension   . Premature atrial contractions   . PVC's (premature ventricular contractions)   . Sinus bradycardia     Past Surgical History:  Procedure Laterality Date  . CHOLECYSTECTOMY N/A 11/07/2017    Procedure: LAPAROSCOPIC CHOLECYSTECTOMY;  Surgeon: Aviva Signs, MD;  Location: [facility];  Service: General;  Laterality: N/A;  . COLONOSCOPY    . ESOPHAGOGASTRODUODENOSCOPY N/A 10/07/2017   Procedure: ESOPHAGOGASTRODUODENOSCOPY (EGD);  Surgeon: Rogene Houston, MD;  Location: [facility];  Service: Endoscopy;  Laterality: N/A;  12:00pm  . GALLBLADDER SURGERY  11/07/2017  . NO PAST SURGERIES      Current Medications: Current Meds  Medication Sig  . cetirizine (ZYRTEC) 10 MG tablet Take 10 mg by mouth daily.  . cyclobenzaprine (FLEXERIL) 10 MG tablet Take 1 tablet (10 mg total) by mouth 3 (three) times daily as needed for muscle spasms.  Marland Kitchen docusate sodium (COLACE) 100 MG capsule Take 100 mg by mouth daily as needed for mild constipation.  . famotidine (PEPCID) 20 MG tablet Take 1 tablet (20 mg total) by mouth 2 (two) times daily.  Marland Kitchen HYDROcodone-acetaminophen (NORCO/VICODIN) 5-325 MG tablet Take 1 tablet by mouth every 6 (six) hours as needed for moderate pain.    Allergies:   Clonidine derivatives; Lisinopril; and Protonix [pantoprazole sodium]   Social History   Socioeconomic History  . Marital status: Married    Spouse name: Not on file  . Number of children: Not on file  . Years of education: Not on file  . Highest education level: Not on file  Occupational History  . Not on file  Social Needs  . Financial resource strain: Not on file  . Food insecurity:    Worry: Not on file    Inability: Not on file  . Transportation needs:    Medical: Not on file    Non-medical: Not on file  Tobacco Use  . Smoking status: Former Smoker    Types: Cigars    Last attempt to quit: 11/05/2007    Years since quitting: 10.5  . Smokeless tobacco: Former Systems developer    Types: Snuff    Quit date: 11/05/2015  . Tobacco comment: dips snuff for 25 years.cigar once or twicea year  Substance and Sexual Activity  . Alcohol use: No  . Drug use: No  . Sexual activity: Yes    Birth control/protection:  None  Lifestyle  . Physical activity:    Days per week: Not on file    Minutes per session: Not on file  . Stress: Not on file  Relationships  . Social connections:    Talks on phone: Not on file    Gets together: Not on file    Attends religious service: Not on file    Active member of club or organization: Not on file    Attends meetings of clubs or organizations: Not on file    Relationship status: Not on file  Other Topics Concern  . Not on file  Social History Narrative  . Not on file     Family History:  The patient's family history includes Arrhythmia in his maternal grandmother and mother; Cataracts in  his mother; Dementia in his paternal grandmother; Diabetes in his brother; Heart attack in his maternal grandmother; Heart attack (age of onset: 63) in his father; Hypertension in his brother and mother; Kidney failure in his brother; Leukemia in his mother; Obesity in his brother; Peripheral Artery Disease in his maternal grandfather and mother; Stroke in his maternal grandfather.  ROS:   Please see the history of present illness.  All other systems are reviewed and otherwise negative.    PHYSICAL EXAM:   VS:  BP 124/80   Pulse 60   Ht 5\' 10"  (1.778 m)   Wt 202 lb 9.6 oz (91.9 kg)   SpO2 97%   BMI 29.07 kg/m   BMI: Body mass index is 29.07 kg/m. GEN: Well nourished, well developed WM, in no acute distress HEENT: normocephalic, atraumatic Neck: no JVD, carotid bruits, or masses Cardiac:RRR; no murmurs, rubs, or gallops, no edema  Respiratory:  clear to auscultation bilaterally, normal work of breathing GI: soft, nontender, nondistended, + BS MS: no deformity or atrophy Skin: warm and dry, no rash Neuro:  Alert and Oriented x 3, Strength and sensation are intact, follows commands Psych: euthymic mood, full affect  Wt Readings from Last 3 Encounters:  06/06/18 202 lb 9.6 oz (91.9 kg)  05/26/18 187 lb (84.8 kg)  03/26/18 187 lb (84.8 kg)      Studies/Labs  Reviewed:   EKG:  EKG was not ordered today Reviewed tracings as above from prior encounters  Recent Labs: 05/26/2018: ALT 14; BUN 10; Creatinine, Ser 0.82; Hemoglobin 16.4; Platelets 227; Potassium 3.8; Sodium 142   Lipid Panel No results found for: CHOL, TRIG, HDL, CHOLHDL, VLDL, LDLCALC, LDLDIRECT  Additional studies/ records that were reviewed today include: Summarized above.   ASSESSMENT & PLAN:   1. Sinus bradycardia/junctional bradycardia - there is clearly objective evidence of sinus node dysfunction, but it's a little difficult to get a grasp on how truly symptomatic he is with this. He has had several encounters for dizziness and chest discomfort without acute ischemic findings, but there clearly has been documentation of HR as low as 32 on home monitor as well as 41 in the ER in May. Prior laboratory testing has been unrevealing. I believe we need to do some investigation structurally to further evaluate - will plan 2D echocardiogram to exclude structural abnormality, and arrange ETT to evaluate chronotropic competence. I'd like him to see EP to discuss whether further interventions are necessary. 2. Atypical chest pain - prior history of such, mostly resolved with prior GI interventions (cholecystectomy, Pepcid). No exertional anginal-type symptoms recently. Fam hx notable for irregular HB in mother but not really CAD. No hx of smoking or HLD. Plan ETT to exclude ischemia contributing to bradycardia. 3. PVCs/PACs - noted by event monitor. Cannot treat given baseline slow HR. Follow. 4. Essential HTN - he reports some head fogginess since starting the losartan. I cannot be sure this is not from bradycardia as his BP is totally normal. Will proceed with eval as above before making any med changes. We did discuss in the future he should avoid any meds with HR lowering potential (which would include the clonidine that he's no longer on).  Disposition: Refer to EP.  Medication  Adjustments/Labs and Tests Ordered: Current medicines are reviewed at length with the patient today.  Concerns regarding medicines are outlined above. Medication changes, Labs and Tests ordered today are summarized above and listed in the Patient Instructions accessible in Encounters.   Signed, Lisbeth Renshaw  Deanne Coffer, PA-C  06/06/2018 2:07 PM    [facility] Location in [place]. [place], [place] [address] Ph: [phone]; Fax [phone]

## 2018-06-06 ENCOUNTER — Encounter: Payer: Self-pay | Admitting: Physician Assistant

## 2018-06-06 ENCOUNTER — Ambulatory Visit (INDEPENDENT_AMBULATORY_CARE_PROVIDER_SITE_OTHER): Payer: Medicare Other | Admitting: Physician Assistant

## 2018-06-06 VITALS — BP 124/80 | HR 60 | Ht 70.0 in | Wt 202.6 lb

## 2018-06-06 DIAGNOSIS — R0789 Other chest pain: Secondary | ICD-10-CM | POA: Diagnosis not present

## 2018-06-06 DIAGNOSIS — I10 Essential (primary) hypertension: Secondary | ICD-10-CM | POA: Diagnosis not present

## 2018-06-06 DIAGNOSIS — I493 Ventricular premature depolarization: Secondary | ICD-10-CM

## 2018-06-06 DIAGNOSIS — I491 Atrial premature depolarization: Secondary | ICD-10-CM | POA: Diagnosis not present

## 2018-06-06 DIAGNOSIS — R001 Bradycardia, unspecified: Secondary | ICD-10-CM

## 2018-06-06 NOTE — Patient Instructions (Signed)
Medication Instructions:  Your physician recommends that you continue on your current medications as directed. Please refer to the Current Medication list given to you today.   Labwork: NONE   Testing/Procedures: Your physician has requested that you have an echocardiogram. Echocardiography is a painless test that uses sound waves to create images of your heart. It provides your doctor with information about the size and shape of your heart and how well your heart's chambers and valves are working. This procedure takes approximately one hour. There are no restrictions for this procedure.  Your physician has requested that you have an exercise tolerance test. For further information please visit [email or web address]. Please also follow instruction sheet, as given.    Follow-Up: You have been referred to Dr. Lovena Le    Any Other Special Instructions Will Be Listed Below (If Applicable).     If you need a refill on your cardiac medications before your next appointment, please call your pharmacy. Thank you for choosing [facility]!

## 2018-06-07 DIAGNOSIS — M47812 Spondylosis without myelopathy or radiculopathy, cervical region: Secondary | ICD-10-CM | POA: Diagnosis not present

## 2018-06-07 DIAGNOSIS — M47814 Spondylosis without myelopathy or radiculopathy, thoracic region: Secondary | ICD-10-CM | POA: Diagnosis not present

## 2018-06-14 ENCOUNTER — Ambulatory Visit (HOSPITAL_COMMUNITY)
Admission: RE | Admit: 2018-06-14 | Discharge: 2018-06-14 | Disposition: A | Discharge status: Home / Self Care | Payer: Medicare Other | Source: Ambulatory Visit | Attending: Physician Assistant | Admitting: Physician Assistant

## 2018-06-14 DIAGNOSIS — K219 Gastro-esophageal reflux disease without esophagitis: Secondary | ICD-10-CM | POA: Insufficient documentation

## 2018-06-14 DIAGNOSIS — R001 Bradycardia, unspecified: Secondary | ICD-10-CM

## 2018-06-14 DIAGNOSIS — R0789 Other chest pain: Secondary | ICD-10-CM | POA: Insufficient documentation

## 2018-06-14 DIAGNOSIS — I081 Rheumatic disorders of both mitral and tricuspid valves: Secondary | ICD-10-CM | POA: Insufficient documentation

## 2018-06-14 LAB — EXERCISE TOLERANCE TEST
CHL RATE OF PERCEIVED EXERTION: 13
CSEPEW: 10.1 METS
Exercise duration (min): 10 min
Exercise duration (sec): 15 s
MPHR: 161 {beats}/min
Peak HR: 130 {beats}/min
Percent HR: 80 %
Rest HR: 50 {beats}/min

## 2018-06-14 NOTE — Progress Notes (Signed)
*  PRELIMINARY RESULTS* Echocardiogram 2D Echocardiogram has been performed.  Samuel Germany 06/14/2018, 10:39 AM

## 2018-06-15 ENCOUNTER — Telehealth: Payer: Self-pay | Admitting: *Deleted

## 2018-06-15 ENCOUNTER — Encounter: Payer: Self-pay | Admitting: *Deleted

## 2018-06-15 DIAGNOSIS — R0789 Other chest pain: Secondary | ICD-10-CM

## 2018-06-15 NOTE — Telephone Encounter (Signed)
Called patient with test results. No answer. Left message to call back. Order placed for cardiac CT

## 2018-06-15 NOTE — Telephone Encounter (Signed)
-----   Message from Charlie Pitter, PA-C sent at 06/15/2018  8:58 AM EDT ----- See result note from echo. Please call pt. He did well with ETT without diagnostic EKG changes, but developed occasional to frequent PVCs with exercise and also did not reach the target HR that we need in order for the test to fully r/o blockage. I would suggest we arrange cardiac CT for atypical chest pain, PVCS and bradycardia. Will NOT need extra pre-med with beta blocker due to baseline low HR. Dayna Dunn PA-C

## 2018-06-30 DIAGNOSIS — Z6827 Body mass index (BMI) 27.0-27.9, adult: Secondary | ICD-10-CM | POA: Diagnosis not present

## 2018-06-30 DIAGNOSIS — R339 Retention of urine, unspecified: Secondary | ICD-10-CM | POA: Diagnosis not present

## 2018-06-30 DIAGNOSIS — K219 Gastro-esophageal reflux disease without esophagitis: Secondary | ICD-10-CM | POA: Diagnosis not present

## 2018-06-30 DIAGNOSIS — M542 Cervicalgia: Secondary | ICD-10-CM | POA: Diagnosis not present

## 2018-06-30 DIAGNOSIS — Z6828 Body mass index (BMI) 28.0-28.9, adult: Secondary | ICD-10-CM | POA: Diagnosis not present

## 2018-06-30 DIAGNOSIS — Z0000 Encounter for general adult medical examination without abnormal findings: Secondary | ICD-10-CM | POA: Diagnosis not present

## 2018-06-30 DIAGNOSIS — I10 Essential (primary) hypertension: Secondary | ICD-10-CM | POA: Diagnosis not present

## 2018-06-30 DIAGNOSIS — K649 Unspecified hemorrhoids: Secondary | ICD-10-CM | POA: Diagnosis not present

## 2018-06-30 DIAGNOSIS — K5900 Constipation, unspecified: Secondary | ICD-10-CM | POA: Diagnosis not present

## 2018-07-03 ENCOUNTER — Other Ambulatory Visit: Payer: Self-pay | Admitting: Nurse Practitioner

## 2018-07-03 DIAGNOSIS — R9430 Abnormal result of cardiovascular function study, unspecified: Secondary | ICD-10-CM

## 2018-07-12 ENCOUNTER — Institutional Professional Consult (permissible substitution): Payer: Medicare Other | Admitting: Internal Medicine

## 2018-07-17 ENCOUNTER — Encounter (HOSPITAL_COMMUNITY): Payer: Self-pay | Admitting: Emergency Medicine

## 2018-07-17 ENCOUNTER — Emergency Department (HOSPITAL_COMMUNITY): Payer: Medicare Other

## 2018-07-17 ENCOUNTER — Other Ambulatory Visit: Payer: Self-pay

## 2018-07-17 ENCOUNTER — Emergency Department (HOSPITAL_COMMUNITY)
Admission: EM | Admit: 2018-07-17 | Discharge: 2018-07-18 | Disposition: A | Discharge status: Home / Self Care | Payer: Medicare Other | Attending: Emergency Medicine | Admitting: Emergency Medicine

## 2018-07-17 DIAGNOSIS — R1031 Right lower quadrant pain: Secondary | ICD-10-CM | POA: Diagnosis not present

## 2018-07-17 DIAGNOSIS — Z79899 Other long term (current) drug therapy: Secondary | ICD-10-CM | POA: Diagnosis not present

## 2018-07-17 DIAGNOSIS — Z87891 Personal history of nicotine dependence: Secondary | ICD-10-CM | POA: Diagnosis not present

## 2018-07-17 DIAGNOSIS — I10 Essential (primary) hypertension: Secondary | ICD-10-CM | POA: Diagnosis not present

## 2018-07-17 DIAGNOSIS — N50811 Right testicular pain: Secondary | ICD-10-CM | POA: Insufficient documentation

## 2018-07-17 LAB — COMPREHENSIVE METABOLIC PANEL
ALBUMIN: 4.1 g/dL (ref 3.5–5.0)
ALT: 17 U/L (ref 0–44)
AST: 22 U/L (ref 15–41)
Alkaline Phosphatase: 65 U/L (ref 38–126)
Anion gap: 7 (ref 5–15)
BUN: 10 mg/dL (ref 6–20)
CHLORIDE: 108 mmol/L (ref 98–111)
CO2: 29 mmol/L (ref 22–32)
CREATININE: 0.97 mg/dL (ref 0.61–1.24)
Calcium: 9.4 mg/dL (ref 8.9–10.3)
GFR calc Af Amer: >60 mL/min (ref >60)
GLUCOSE: 124 mg/dL — AB (ref 70–99)
Potassium: 4.1 mmol/L (ref 3.5–5.1)
SODIUM: 144 mmol/L (ref 135–145)
Total Bilirubin: 0.9 mg/dL (ref 0.3–1.2)
Total Protein: 7.6 g/dL (ref 6.5–8.1)

## 2018-07-17 LAB — URINALYSIS, ROUTINE W REFLEX MICROSCOPIC
BACTERIA UA: NONE SEEN
Bilirubin Urine: NEGATIVE
Glucose, UA: NEGATIVE mg/dL
Ketones, ur: NEGATIVE mg/dL
Leukocytes, UA: NEGATIVE
Nitrite: NEGATIVE
PH: 5 (ref 5.0–8.0)
Protein, ur: NEGATIVE mg/dL
Specific Gravity, Urine: 1.023 (ref 1.005–1.030)

## 2018-07-17 LAB — CBC
HCT: 46.5 % (ref 39.0–52.0)
Hemoglobin: 15.9 g/dL (ref 13.0–17.0)
MCH: 30.1 pg (ref 26.0–34.0)
MCHC: 34.2 g/dL (ref 30.0–36.0)
MCV: 87.9 fL (ref 78.0–100.0)
Platelets: 200 10*3/uL (ref 150–400)
RBC: 5.29 MIL/uL (ref 4.22–5.81)
RDW: 13.3 % (ref 11.5–15.5)
WBC: 7.3 10*3/uL (ref 4.0–10.5)

## 2018-07-17 LAB — LIPASE, BLOOD: Lipase: 40 U/L (ref 11–51)

## 2018-07-17 MED ORDER — IOPAMIDOL (ISOVUE-300) INJECTION 61%
100.0000 mL | Freq: Once | INTRAVENOUS | Status: AC | PRN
  Administered 2018-07-17: 100 mL via INTRAVENOUS

## 2018-07-17 NOTE — ED Notes (Signed)
Pt gone over to CT 

## 2018-07-17 NOTE — ED Triage Notes (Signed)
Pt C/O RLQ pain that started last week. Pt states over the weekend his right testicle began to hurt. Pt C/O diarrhea, nausea, and urinary frequency.

## 2018-07-17 NOTE — ED Notes (Signed)
Pt gone over to [place]

## 2018-07-17 NOTE — ED Provider Notes (Signed)
[facility] EMERGENCY DEPARTMENT Provider Note   CSN: [id number] Arrival date & time: 07/17/18  2033     History   Chief Complaint Chief Complaint  Patient presents with  . Abdominal Pain    HPI Ronald Conner is a 57 y.o. male.  Patient reports about 1 month ago he had right-sided lower abdominal pain for which she saw his PCP and was treated with probiotics.  This seemed to improve.  However the pain returned this weekend about 3 days ago after lifting a piece of furniture.  Denies any specific injury.  He also has noticed severe pain in his right testicle that has been constant for the past 3 days.  This happened again when he was lifting furniture.  Denies any history of hernia.  He said several episodes of loose stools over the past 2 days that is nonbloody.  Has had nausea but no vomiting.  No fever.  Urinary frequency and urgency but no hematuria.  Denies any penile discharge.  Denies any cough or fever.  Still has a good appetite and wants to eat.  Had 2 loose stools today and no episodes of vomiting.  Previous cholecystectomy.  The pain in his right side reminds have been wise he had the colitis issue several months ago.  The history is provided by the patient.  Abdominal Pain   Associated symptoms include diarrhea, nausea, dysuria and frequency. Pertinent negatives include vomiting, hematuria, headaches, arthralgias and myalgias.    Past Medical History:  Diagnosis Date  . Acid reflux   . Atypical chest pain   . Family history of adverse reaction to anesthesia    MOM- PONV  . GERD (gastroesophageal reflux disease) 10/03/2017  . Hypertension   . Premature atrial contractions   . PVC's (premature ventricular contractions)   . Sinus bradycardia     Patient Active Problem List   Diagnosis Date Noted  . Chronic cholecystitis   . GERD (gastroesophageal reflux disease) 10/03/2017  . Gastroesophageal reflux disease without esophagitis 10/03/2017    Past Surgical  History:  Procedure Laterality Date  . CHOLECYSTECTOMY N/A 11/07/2017   Procedure: LAPAROSCOPIC CHOLECYSTECTOMY;  Surgeon: Aviva Signs, MD;  Location: [facility] ORS;  Service: General;  Laterality: N/A;  . COLONOSCOPY    . ESOPHAGOGASTRODUODENOSCOPY N/A 10/07/2017   Procedure: ESOPHAGOGASTRODUODENOSCOPY (EGD);  Surgeon: Rogene Houston, MD;  Location: [facility] ENDO SUITE;  Service: Endoscopy;  Laterality: N/A;  12:00pm  . GALLBLADDER SURGERY  11/07/2017  . NO PAST SURGERIES          Home Medications    Prior to Admission medications   Medication Sig Start Date End Date Taking? Authorizing Provider  Bacillus Coagulans-Inulin (PROBIOTIC FORMULA PO) Take 1 tablet by mouth daily.   Yes [provider]  cetirizine (ZYRTEC) 10 MG tablet Take 10 mg by mouth daily.   Yes [provider]  cyclobenzaprine (FLEXERIL) 10 MG tablet Take 1 tablet (10 mg total) by mouth 3 (three) times daily as needed for muscle spasms. 03/27/18  Yes Pollina, Gwenyth Allegra, MD  docusate sodium (COLACE) 100 MG capsule Take 100 mg by mouth daily as needed for mild constipation.   Yes [provider]  HYDROcodone-acetaminophen (NORCO/VICODIN) 5-325 MG tablet Take 1 tablet by mouth every 6 (six) hours as needed for moderate pain. 11/07/17  Yes Aviva Signs, MD  losartan (COZAAR) 50 MG tablet Take 25 mg by mouth 2 (two) times daily.  05/30/18  Yes [provider]  famotidine (PEPCID) 20 MG  tablet Take 1 tablet (20 mg total) by mouth 2 (two) times daily. Patient not taking: Reported on 07/17/2018 05/26/18   Milton Ferguson, MD    Family History Family History  Problem Relation Age of Onset  . Arrhythmia Mother        h/o DCCV  . Leukemia Mother   . Hypertension Mother   . Cataracts Mother   . Peripheral Artery Disease Mother   . Heart attack Father 16  . Kidney failure Brother        on dialysis  . Hypertension Brother   . Arrhythmia Maternal Grandmother   . Heart attack Maternal Grandmother     . Stroke Maternal Grandfather   . Peripheral Artery Disease Maternal Grandfather        double amputee  . Dementia Paternal Grandmother   . Diabetes Brother   . Obesity Brother     Social History Social History   Tobacco Use  . Smoking status: Former Smoker    Types: Cigars    Last attempt to quit: 11/05/2007    Years since quitting: 10.7  . Smokeless tobacco: Former Systems developer    Types: Snuff    Quit date: 11/05/2015  . Tobacco comment: dips snuff for 25 years.cigar once or twicea year  Substance Use Topics  . Alcohol use: No  . Drug use: No     Allergies   Clonidine derivatives; Lisinopril; Protonix [pantoprazole sodium]; and Iodine   Review of Systems Review of Systems  Constitutional: Negative for activity change and appetite change.  HENT: Negative for congestion and rhinorrhea.   Respiratory: Negative for cough, chest tightness and shortness of breath.   Cardiovascular: Negative for chest pain.  Gastrointestinal: Positive for abdominal pain, diarrhea and nausea. Negative for vomiting.  Genitourinary: Positive for dysuria, frequency, testicular pain and urgency. Negative for decreased urine volume, discharge, flank pain and hematuria.  Musculoskeletal: Negative for arthralgias and myalgias.  Skin: Negative for rash.  Neurological: Negative for dizziness, tremors and headaches.   all other systems are negative except as noted in the HPI and PMH.     Physical Exam Updated Vital Signs BP (!) 136/92 (BP Location: Right Arm)   Pulse 60   Temp 98 F (36.7 C) (Oral)   Resp 20   Ht 5\' 9"  (1.753 m)   Wt 90.7 kg   SpO2 100%   BMI 29.53 kg/m   Physical Exam  Constitutional: He is oriented to person, place, and time. He appears well-developed and well-nourished. No distress.  HENT:  Head: Normocephalic and atraumatic.  Mouth/Throat: Oropharynx is clear and moist. No oropharyngeal exudate.  Eyes: Pupils are equal, round, and reactive to light. Conjunctivae and EOM  are normal.  Neck: Normal range of motion. Neck supple.  No meningismus.  Cardiovascular: Normal rate, regular rhythm, normal heart sounds and intact distal pulses.  No murmur heard. Pulmonary/Chest: Effort normal and breath sounds normal. No respiratory distress.  Abdominal: Soft. There is tenderness. There is no rebound and no guarding.  Mild right lower quadrant abdominal tenderness, no guarding or rebound  Genitourinary:  Genitourinary Comments: Testicles appear normal but right side is tender to palpation.  There is normal lie.  There is no appreciable hernia.  Musculoskeletal: Normal range of motion. He exhibits no edema or tenderness.  No CVA tenderness  Neurological: He is alert and oriented to person, place, and time. No cranial nerve deficit. He exhibits normal muscle tone. Coordination normal.  No ataxia on finger to nose bilaterally. No  pronator drift. 5/5 strength throughout. CN 2-12 intact.Equal grip strength. Sensation intact.   Skin: Skin is warm.  Psychiatric: He has a normal mood and affect. His behavior is normal.  Nursing note and vitals reviewed.    ED Treatments / Results  Labs (all labs ordered are listed, but only abnormal results are displayed) Labs Reviewed  COMPREHENSIVE METABOLIC PANEL - Abnormal; Notable for the following components:      Result Value   Glucose, Bld 124 (*)    All other components within normal limits  URINALYSIS, ROUTINE W REFLEX MICROSCOPIC - Abnormal; Notable for the following components:   Color, Urine AMBER (*)    APPearance HAZY (*)    Hgb urine dipstick SMALL (*)    All other components within normal limits  LIPASE, BLOOD  CBC    EKG None  Radiology Ct Abdomen Pelvis W Contrast  Result Date: 07/18/2018 CLINICAL DATA:  Right lower quadrant pain EXAM: CT ABDOMEN AND PELVIS WITH CONTRAST TECHNIQUE: Multidetector CT imaging of the abdomen and pelvis was performed using the standard protocol following bolus administration of  intravenous contrast. CONTRAST:  165mL ISOVUE-300 IOPAMIDOL (ISOVUE-300) INJECTION 61% COMPARISON:  10/27/2017 FINDINGS: Lower chest: Lung bases are clear. No effusions. Heart is normal size. Hepatobiliary: No focal liver abnormality is seen. Status post cholecystectomy. No biliary dilatation. Pancreas: No focal abnormality or ductal dilatation. Spleen: No focal abnormality.  Normal size. Adrenals/Urinary Tract: Small cyst in the midpole of the right kidney. No hydronephrosis. No adrenal mass. Urinary bladder unremarkable. Stomach/Bowel: Normal appendix. Stomach, large and small bowel grossly unremarkable. Vascular/Lymphatic: No evidence of aneurysm or adenopathy. Reproductive: Prostate calcifications. Other: No free fluid or free air. Musculoskeletal: No acute bony abnormality. IMPRESSION: Normal appendix. No acute findings in the abdomen or pelvis. Electronically Signed   By: Rolm Baptise M.D.   On: 07/18/2018 00:12   US Scrotum W/doppler  Result Date: 07/18/2018 CLINICAL DATA:  Right testicle pain EXAM: SCROTAL ULTRASOUND DOPPLER ULTRASOUND OF THE TESTICLES TECHNIQUE: Complete ultrasound examination of the testicles, epididymis, and other scrotal structures was performed. Color and spectral Doppler ultrasound were also utilized to evaluate blood flow to the testicles. COMPARISON:  None. FINDINGS: Right testicle Measurements: 5.4 x 2.7 x 2.8 cm. No mass or microlithiasis visualized. Left testicle Measurements: 4.9 x 2.6 x 3.0 cm. No mass or microlithiasis visualized. Right epididymis:  Normal in size and appearance. Left epididymis:  Normal in size and appearance. Hydrocele:  None visualized. Varicocele:  None visualized. Pulsed Doppler interrogation of both testes demonstrates normal low resistance arterial and venous waveforms bilaterally. IMPRESSION: Normal study. Electronically Signed   By: Rolm Baptise M.D.   On: 07/18/2018 00:02    Procedures Procedures (including critical care time)  Medications  Ordered in ED Medications  iopamidol (ISOVUE-300) 61 % injection 100 mL (has no administration in time range)     Initial Impression / Assessment and Plan / ED Course  I have reviewed the triage vital signs and the nursing notes.  Pertinent labs & imaging results that were available during my care of the patient were reviewed by me and considered in my medical decision making (see chart for details).    3 days of right testicular pain after lifting furniture.  There is no appreciable hernia on exam.  Urinalysis shows hematuria and patient has mild right-sided abdominal pain similar to previous episodes of colitis. Will obtain testicular ultrasound.  UA with small amount of hematuria.  Ultrasound shows no testicular torsion.  Question possible  passed kidney stone versus groin strain.  No evidence of appendicitis or ureteral stone on CT.  Treat supportively with NSAIDs, supportive underwear, PCP and urology follow-up.  Return precautions discussed. Final Clinical Impressions(s) / ED Diagnoses   Final diagnoses:  Right testicular pain    ED Discharge Orders    None       Jessilyn Catino, Annie Main, MD 07/18/18 [phone]

## 2018-07-18 MED ORDER — NAPROXEN 500 MG PO TABS: 500.0000 mg | ORAL_TABLET | Freq: Two times a day (BID) | ORAL | 0 refills | Status: DC

## 2018-07-18 NOTE — Discharge Instructions (Addendum)
As we discussed, you may have passed a kidney stone or have strained your groin musculature.  Wear tight fitting underwear and using anti-inflammatories.  Follow-up with your primary doctor and the urologist.  Return to the ED if you develop new or worsening symptoms.

## 2018-07-27 ENCOUNTER — Ambulatory Visit (HOSPITAL_COMMUNITY): Payer: Medicare Other

## 2018-07-27 ENCOUNTER — Ambulatory Visit (HOSPITAL_COMMUNITY): Admission: RE | Admit: 2018-07-27 | Payer: Medicare Other | Source: Ambulatory Visit

## 2018-07-28 ENCOUNTER — Ambulatory Visit (HOSPITAL_COMMUNITY): Payer: Medicare Other

## 2018-07-31 ENCOUNTER — Ambulatory Visit (HOSPITAL_COMMUNITY): Payer: Medicare Other

## 2018-09-06 DIAGNOSIS — M47812 Spondylosis without myelopathy or radiculopathy, cervical region: Secondary | ICD-10-CM | POA: Diagnosis not present

## 2018-09-07 DIAGNOSIS — Z6831 Body mass index (BMI) 31.0-31.9, adult: Secondary | ICD-10-CM | POA: Diagnosis not present

## 2018-09-07 DIAGNOSIS — K5900 Constipation, unspecified: Secondary | ICD-10-CM | POA: Diagnosis not present

## 2018-09-07 DIAGNOSIS — I10 Essential (primary) hypertension: Secondary | ICD-10-CM | POA: Diagnosis not present

## 2018-09-08 ENCOUNTER — Telehealth: Payer: Self-pay | Admitting: Cardiology

## 2018-09-08 NOTE — Telephone Encounter (Signed)
Patient wanting to speak with someone about his Blood pressure and his med changes

## 2018-09-08 NOTE — Telephone Encounter (Signed)
Pt's wife called to inform that Dr. Nevada Crane had to increase pt's losartan to 50 mg - two times daily as the pt's blood pressure was staying steadily around 160/100. I will forward to [place], [place] as an [place].

## 2018-09-20 ENCOUNTER — Telehealth: Payer: Self-pay

## 2018-09-20 ENCOUNTER — Other Ambulatory Visit (HOSPITAL_COMMUNITY)
Admission: RE | Admit: 2018-09-20 | Discharge: 2018-09-20 | Disposition: A | Discharge status: Home / Self Care | Payer: Medicare Other | Source: Ambulatory Visit | Attending: Cardiology | Admitting: Cardiology

## 2018-09-20 ENCOUNTER — Other Ambulatory Visit: Payer: Self-pay

## 2018-09-20 DIAGNOSIS — Z01818 Encounter for other preprocedural examination: Secondary | ICD-10-CM | POA: Insufficient documentation

## 2018-09-20 LAB — BASIC METABOLIC PANEL
ANION GAP: 7 (ref 5–15)
BUN: 10 mg/dL (ref 6–20)
CO2: 27 mmol/L (ref 22–32)
Calcium: 9.4 mg/dL (ref 8.9–10.3)
Chloride: 105 mmol/L (ref 98–111)
Creatinine, Ser: 0.92 mg/dL (ref 0.61–1.24)
GFR calc Af Amer: >60 mL/min (ref >60)
GFR calc non Af Amer: >60 mL/min (ref >60)
GLUCOSE: 102 mg/dL — AB (ref 70–99)
POTASSIUM: 4.3 mmol/L (ref 3.5–5.1)
SODIUM: 139 mmol/L (ref 135–145)

## 2018-09-20 MED ORDER — METOPROLOL TARTRATE 100 MG PO TABS: ORAL_TABLET | ORAL | 0 refills | Status: DC

## 2018-09-20 NOTE — Telephone Encounter (Signed)
-----   Message from Emmaline Life, RN sent at 09/14/2018 10:20 AM EDT ----- Good morning,  I was asked to put in coronary CT orders for this patient bc there was a problem with the first order per [facility]. Would you guys please contact patient with his CT instructions which is scheduled for Nov. 6.  Thank you, Sharyn Lull

## 2018-09-20 NOTE — Telephone Encounter (Signed)
I spoke with wife, Bethena Roys, gave her instructions for cardiac CT, e-scribed lopressor to pharmacy, answered all questions

## 2018-09-27 ENCOUNTER — Ambulatory Visit (HOSPITAL_COMMUNITY): Admission: RE | Admit: 2018-09-27 | Payer: Medicare Other | Source: Ambulatory Visit

## 2018-09-27 ENCOUNTER — Ambulatory Visit (HOSPITAL_COMMUNITY)
Admission: RE | Admit: 2018-09-27 | Discharge: 2018-09-27 | Disposition: A | Discharge status: Home / Self Care | Payer: Medicare Other | Source: Ambulatory Visit | Attending: Physician Assistant | Admitting: Physician Assistant

## 2018-09-27 DIAGNOSIS — R9430 Abnormal result of cardiovascular function study, unspecified: Secondary | ICD-10-CM

## 2018-09-27 MED ORDER — NITROGLYCERIN 0.4 MG SL SUBL
0.8000 mg | SUBLINGUAL_TABLET | Freq: Once | SUBLINGUAL | Status: AC
  Administered 2018-09-27: 0.8 mg via SUBLINGUAL
  Filled 2018-09-27: qty 25

## 2018-09-27 MED ORDER — NITROGLYCERIN 0.4 MG SL SUBL
SUBLINGUAL_TABLET | SUBLINGUAL | Status: AC
  Filled 2018-09-27: qty 2

## 2018-09-27 MED ORDER — IOPAMIDOL (ISOVUE-370) INJECTION 76%
100.0000 mL | Freq: Once | INTRAVENOUS | Status: AC | PRN
  Administered 2018-09-27: 80 mL via INTRAVENOUS

## 2018-09-27 NOTE — Progress Notes (Signed)
CT scan completed. Tolerated well. D/C home in wheelchair with wife. Awake and alert. In no distress 

## 2018-10-06 DIAGNOSIS — K5909 Other constipation: Secondary | ICD-10-CM | POA: Diagnosis not present

## 2018-10-06 DIAGNOSIS — F419 Anxiety disorder, unspecified: Secondary | ICD-10-CM | POA: Diagnosis not present

## 2018-10-06 DIAGNOSIS — I10 Essential (primary) hypertension: Secondary | ICD-10-CM | POA: Diagnosis not present

## 2018-10-17 DIAGNOSIS — R001 Bradycardia, unspecified: Secondary | ICD-10-CM | POA: Diagnosis not present

## 2018-10-17 DIAGNOSIS — I10 Essential (primary) hypertension: Secondary | ICD-10-CM | POA: Diagnosis not present

## 2018-10-17 DIAGNOSIS — R079 Chest pain, unspecified: Secondary | ICD-10-CM | POA: Diagnosis not present

## 2018-11-06 DIAGNOSIS — Z0000 Encounter for general adult medical examination without abnormal findings: Secondary | ICD-10-CM | POA: Diagnosis not present

## 2018-11-06 DIAGNOSIS — Z6831 Body mass index (BMI) 31.0-31.9, adult: Secondary | ICD-10-CM | POA: Diagnosis not present

## 2018-11-06 DIAGNOSIS — I10 Essential (primary) hypertension: Secondary | ICD-10-CM | POA: Diagnosis not present

## 2018-11-10 DIAGNOSIS — K219 Gastro-esophageal reflux disease without esophagitis: Secondary | ICD-10-CM | POA: Diagnosis not present

## 2018-11-10 DIAGNOSIS — I10 Essential (primary) hypertension: Secondary | ICD-10-CM | POA: Diagnosis not present

## 2018-11-10 DIAGNOSIS — E781 Pure hyperglyceridemia: Secondary | ICD-10-CM | POA: Diagnosis not present

## 2018-11-10 DIAGNOSIS — M542 Cervicalgia: Secondary | ICD-10-CM | POA: Diagnosis not present

## 2018-11-24 DIAGNOSIS — I10 Essential (primary) hypertension: Secondary | ICD-10-CM | POA: Diagnosis not present

## 2018-11-27 ENCOUNTER — Ambulatory Visit (INDEPENDENT_AMBULATORY_CARE_PROVIDER_SITE_OTHER): Payer: Medicare Other | Admitting: Internal Medicine

## 2018-12-06 DIAGNOSIS — I10 Essential (primary) hypertension: Secondary | ICD-10-CM | POA: Diagnosis not present

## 2018-12-06 DIAGNOSIS — J060 Acute laryngopharyngitis: Secondary | ICD-10-CM | POA: Diagnosis not present

## 2018-12-06 DIAGNOSIS — S2341XA Sprain of ribs, initial encounter: Secondary | ICD-10-CM | POA: Diagnosis not present

## 2019-01-04 DIAGNOSIS — M47812 Spondylosis without myelopathy or radiculopathy, cervical region: Secondary | ICD-10-CM | POA: Diagnosis not present

## 2019-01-04 DIAGNOSIS — M47814 Spondylosis without myelopathy or radiculopathy, thoracic region: Secondary | ICD-10-CM | POA: Diagnosis not present

## 2019-03-13 ENCOUNTER — Ambulatory Visit: Payer: Medicare Other | Admitting: Cardiology

## 2019-05-07 DIAGNOSIS — I10 Essential (primary) hypertension: Secondary | ICD-10-CM | POA: Diagnosis not present

## 2019-05-11 DIAGNOSIS — I10 Essential (primary) hypertension: Secondary | ICD-10-CM | POA: Diagnosis not present

## 2019-05-11 DIAGNOSIS — E669 Obesity, unspecified: Secondary | ICD-10-CM | POA: Diagnosis not present

## 2019-05-11 DIAGNOSIS — Z0001 Encounter for general adult medical examination with abnormal findings: Secondary | ICD-10-CM | POA: Diagnosis not present

## 2019-05-11 DIAGNOSIS — K219 Gastro-esophageal reflux disease without esophagitis: Secondary | ICD-10-CM | POA: Diagnosis not present

## 2019-05-11 DIAGNOSIS — M542 Cervicalgia: Secondary | ICD-10-CM | POA: Diagnosis not present

## 2019-05-11 DIAGNOSIS — E781 Pure hyperglyceridemia: Secondary | ICD-10-CM | POA: Diagnosis not present

## 2019-07-09 ENCOUNTER — Ambulatory Visit (INDEPENDENT_AMBULATORY_CARE_PROVIDER_SITE_OTHER): Payer: Medicare Other | Admitting: Cardiology

## 2019-07-09 ENCOUNTER — Encounter: Payer: Self-pay | Admitting: Cardiology

## 2019-07-09 ENCOUNTER — Other Ambulatory Visit: Payer: Self-pay

## 2019-07-09 VITALS — BP 132/90 | HR 56 | Temp 97.5°F | Ht 69.0 in | Wt 231.0 lb

## 2019-07-09 DIAGNOSIS — R0789 Other chest pain: Secondary | ICD-10-CM

## 2019-07-09 DIAGNOSIS — R001 Bradycardia, unspecified: Secondary | ICD-10-CM

## 2019-07-09 DIAGNOSIS — I10 Essential (primary) hypertension: Secondary | ICD-10-CM | POA: Diagnosis not present

## 2019-07-09 NOTE — Progress Notes (Signed)
Clinical Summary Mr. Salay is a 58 y.o.male seen today for follow up of the following medical problems.   1 . Bradycardia/Dizziness 03/27/18 EKG junctional rhythm 40s 02/2018 Holter: sinus brady, junctional brady 40, sinus pauses with junctional escape. PVCs, rare atach  - denies any recent symptoms, nonspecific fatigue.    2. HTN - off lisinopril, now on losartan due to side effects per his report.  - previous hypokalemia on HCTZ  3. Atypical chest pain - 05/2018 GXT 80% of THR, did not reach neccesary rate for ischemia evaluation. PVCs with exercise.  - 09/2018 coronary CTA: calcium score of 0, no significant CAD.   - no recent chest pain. He reports since gallbladder removed symptoms have completely resolved.      SH: retired Event organiser   Past Medical History:  Diagnosis Date  . Acid reflux   . Atypical chest pain   . Family history of adverse reaction to anesthesia    MOM- PONV  . GERD (gastroesophageal reflux disease) 10/03/2017  . Hypertension   . Premature atrial contractions   . PVC's (premature ventricular contractions)   . Sinus bradycardia      Allergies  Allergen Reactions  . Clonidine Derivatives Other (See Comments)    Constipation-  . Lisinopril Other (See Comments) and Cough    Severe back pain  . Protonix [Pantoprazole Sodium] Other (See Comments)    Lightheaded, head aches  . Iodine Rash     Current Outpatient Medications  Medication Sig Dispense Refill  . Bacillus Coagulans-Inulin (PROBIOTIC FORMULA PO) Take 1 tablet by mouth daily.    . cetirizine (ZYRTEC) 10 MG tablet Take 10 mg by mouth daily.    . cyclobenzaprine (FLEXERIL) 10 MG tablet Take 1 tablet (10 mg total) by mouth 3 (three) times daily as needed for muscle spasms. 20 tablet 0  . docusate sodium (COLACE) 100 MG capsule Take 100 mg by mouth daily as needed for mild constipation.    . famotidine (PEPCID) 20 MG tablet Take 1 tablet (20 mg total) by mouth 2 (two)  times daily. (Patient not taking: Reported on 07/17/2018) 30 tablet 0  . HYDROcodone-acetaminophen (NORCO/VICODIN) 5-325 MG tablet Take 1 tablet by mouth every 6 (six) hours as needed for moderate pain. 25 tablet 0  . losartan (COZAAR) 50 MG tablet Take 50 mg by mouth 2 (two) times daily.   5  . metoprolol tartrate (LOPRESSOR) 100 MG tablet Take 100 mg 2 hours before procedure 1 tablet 0  . naproxen (NAPROSYN) 500 MG tablet Take 1 tablet (500 mg total) by mouth 2 (two) times daily. (Patient not taking: Reported on 09/27/2018) 30 tablet 0   No current facility-administered medications for this visit.      Past Surgical History:  Procedure Laterality Date  . CHOLECYSTECTOMY N/A 11/07/2017   Procedure: LAPAROSCOPIC CHOLECYSTECTOMY;  Surgeon: Aviva Signs, MD;  Location: [facility] ORS;  Service: General;  Laterality: N/A;  . COLONOSCOPY    . ESOPHAGOGASTRODUODENOSCOPY N/A 10/07/2017   Procedure: ESOPHAGOGASTRODUODENOSCOPY (EGD);  Surgeon: Rogene Houston, MD;  Location: [facility] END[facility] SUITE;  Service: Endoscopy;  Laterality: N/A;  12:00pm  . GALLBLADDER SURGERY  11/07/2017  . NO PAST SURGERIES       Allergies  Allergen Reactions  . Clonidine Derivatives Other (See Comments)    Constipation-  . Lisinopril Other (See Comments) and Cough    Severe back pain  . Protonix [Pantoprazole Sodium] Other (See Comments)    Lightheaded, head aches  .  Iodine Rash      Family History  Problem Relation Age of Onset  . Arrhythmia Mother        h/o DCCV  . Leukemia Mother   . Hypertension Mother   . Cataracts Mother   . Peripheral Artery Disease Mother   . Heart attack Father 43  . Kidney failure Brother        on dialysis  . Hypertension Brother   . Arrhythmia Maternal Grandmother   . Heart attack Maternal Grandmother   . Stroke Maternal Grandfather   . Peripheral Artery Disease Maternal Grandfather        double amputee  . Dementia Paternal Grandmother   . Diabetes Brother   . Obesity Brother       Social History Mr. Blackburn reports that he quit smoking about 11 years ago. His smoking use included cigars. He quit smokeless tobacco use about 3 years ago.  His smokeless tobacco use included snuff. Mr. Schollmeyer reports no history of alcohol use.   Review of Systems CONSTITUTIONAL: No weight loss, fever, chills, weakness or fatigue.  HEENT: Eyes: No visual loss, blurred vision, double vision or yellow sclerae.No hearing loss, sneezing, congestion, runny nose or sore throat.  SKIN: No rash or itching.  CARDIOVASCULAR: per hpi RESPIRATORY: No shortness of breath, cough or sputum.  GASTROINTESTINAL: No anorexia, nausea, vomiting or diarrhea. No abdominal pain or blood.  GENITOURINARY: No burning on urination, no polyuria NEUROLOGICAL: No headache, dizziness, syncope, paralysis, ataxia, numbness or tingling in the extremities. No change in bowel or bladder control.  MUSCULOSKELETAL: No muscle, back pain, joint pain or stiffness.  LYMPHATICS: No enlarged nodes. No history of splenectomy.  PSYCHIATRIC: No history of depression or anxiety.  ENDOCRINOLOGIC: No reports of sweating, cold or heat intolerance. No polyuria or polydipsia.  Marland Kitchen   Physical Examination Today's Vitals   07/09/19 1252  BP: 132/90  Pulse: (!) 56  Temp: (!) 97.5 F (36.4 C)  Weight: 231 lb (104.8 kg)  Height: 5\' 9"  (1.753 m)   Body mass index is 34.11 kg/m.  Gen: resting comfortably, no acute distress HEENT: no scleral icterus, pupils equal round and reactive, no palptable cervical adenopathy,  CV: RRR, no m/r/g no jvd Resp: Clear to auscultation bilaterally GI: abdomen is soft, non-tender, non-distended, normal bowel sounds, no hepatosplenomegaly MSK: extremities are warm, no edema.  Skin: warm, no rash Neuro:  no focal deficits Psych: appropriate affect   Diagnostic Studies  02/2018 48 hr holter  Sinus rhythm and sinus tachycardia with frequent PVCs and rare PACs with rare 3 beat atrial runs. No  sustained arrhythmias.  Symptoms correlated with sinus rhythm, sinus tachycardia, and PVCs.   05/2018 echo Study Conclusions  - Left ventricle: The cavity size was normal. Wall thickness was   normal. The estimated ejection fraction was 55%. Wall motion was   normal; there were no regional wall motion abnormalities. Left   ventricular diastolic function parameters were normal for the   patient&'s age. - Aortic valve: Mildly calcified annulus. Trileaflet. - Mitral valve: There was mild regurgitation. - Left atrium: The atrium was mildly dilated. - Right atrium: The atrium was moderately dilated. - Tricuspid valve: There was trivial regurgitation. - Pulmonary arteries: Systolic pressure could not be accurately   estimated. - Pericardium, extracardiac: There was no pericardial effusion.   05/2018 GXT  No diagnostic ST segment changes at maximum workload of 10.1 METS and peak heart rate of 130 bpm which represented 80% of the MPHR. This  excludes chronotropic incompetence, however is nondiagnostic for ischemia. There were episodes of occasional to frequent PVCs during exercise, no sustained arrhythmias. Hypertensive response noted. Duke treadmill score not calculated due to failure to achieve target heart rate.  Blood pressure demonstrated a hypertensive response to exercise.   09/2018 CTA IMPRESSION: 1. Coronary artery calcium score 0 Agatston units, suggesting low risk for future cardiac events.  2.  No significant coronary disease noted.    Assessment and Plan  1. Bradycardia -chronic bradycardia, sinus bradycardia and junction brady at times. Had normal chronocotropic compeptence by GXT las year - continue to monitor, no signifiacant symptoms at this time. No indication for pacing currently - EKG today shows mild sinus brady   2. HTN - continue losartan, some side effects as listed above on other meds - borderlien bp today, he will call with a bp log in 1 week. Would  start norvasc if additonal agent needed   3. Atypical chest pain - resolved after gallbladder removal. Prior extenstive cardiac workup with coronary CTA with a 0 calcium score and normal coronaries   F/u 1 year    Arnoldo Lenis, M.D.

## 2019-07-09 NOTE — Patient Instructions (Signed)
Medication Instructions: Your physician recommends that you continue on your current medications as directed. Please refer to the Current Medication list given to you today.   Labwork: None  Procedures/Testing: None  Follow-Up: 1 year with Dr.Branch  Any Additional Special Instructions Will Be Listed Below (If Applicable).    Keep BP lod for 1 week If you need a refill on your cardiac medications before your next appointment, please call your pharmacy.

## 2019-07-16 ENCOUNTER — Telehealth: Payer: Self-pay | Admitting: Cardiology

## 2019-07-16 NOTE — Telephone Encounter (Signed)
Error

## 2019-07-19 ENCOUNTER — Telehealth: Payer: Self-pay | Admitting: Student

## 2019-07-19 NOTE — Telephone Encounter (Signed)
Pt made aware. Voiced understanding.  

## 2019-07-19 NOTE — Telephone Encounter (Signed)
    Please let the patient know I reviewed his blood pressure log in Dr. Nelly Laurence absence and readings have overall been well-controlled. Would continue current medication regimen for now. Would continue to check BP a few times per week for continual monitoring.  Signed, Erma Heritage, PA-C 07/19/2019, 9:03 AM Pager: [phone]

## 2019-09-19 IMAGING — DX DG CHEST 2V
2 series · 2 of 2 positions shown · non-contrast
Comparison: 05/23/2017

CLINICAL DATA: He was working outside and his shirt became soaking
wet, dizziness and chest pain today.

EXAM:
CHEST  2 VIEW

[chest pa]
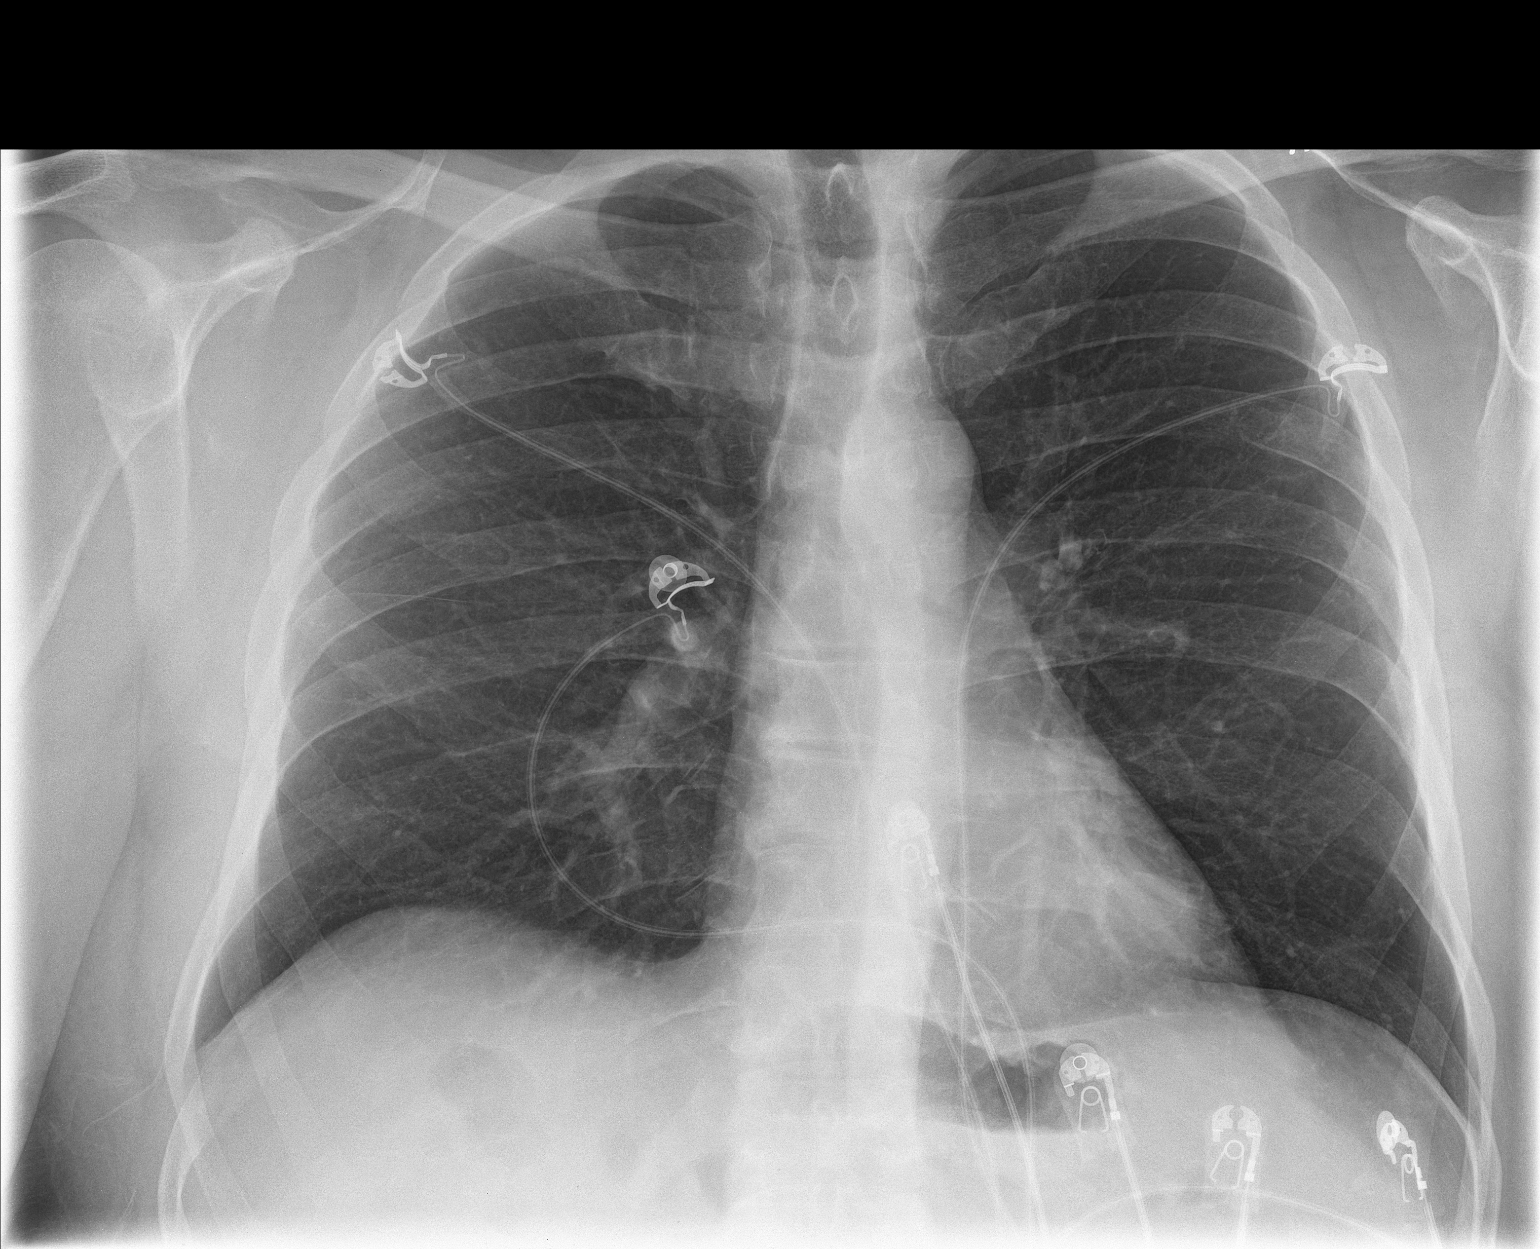

[chest lat]
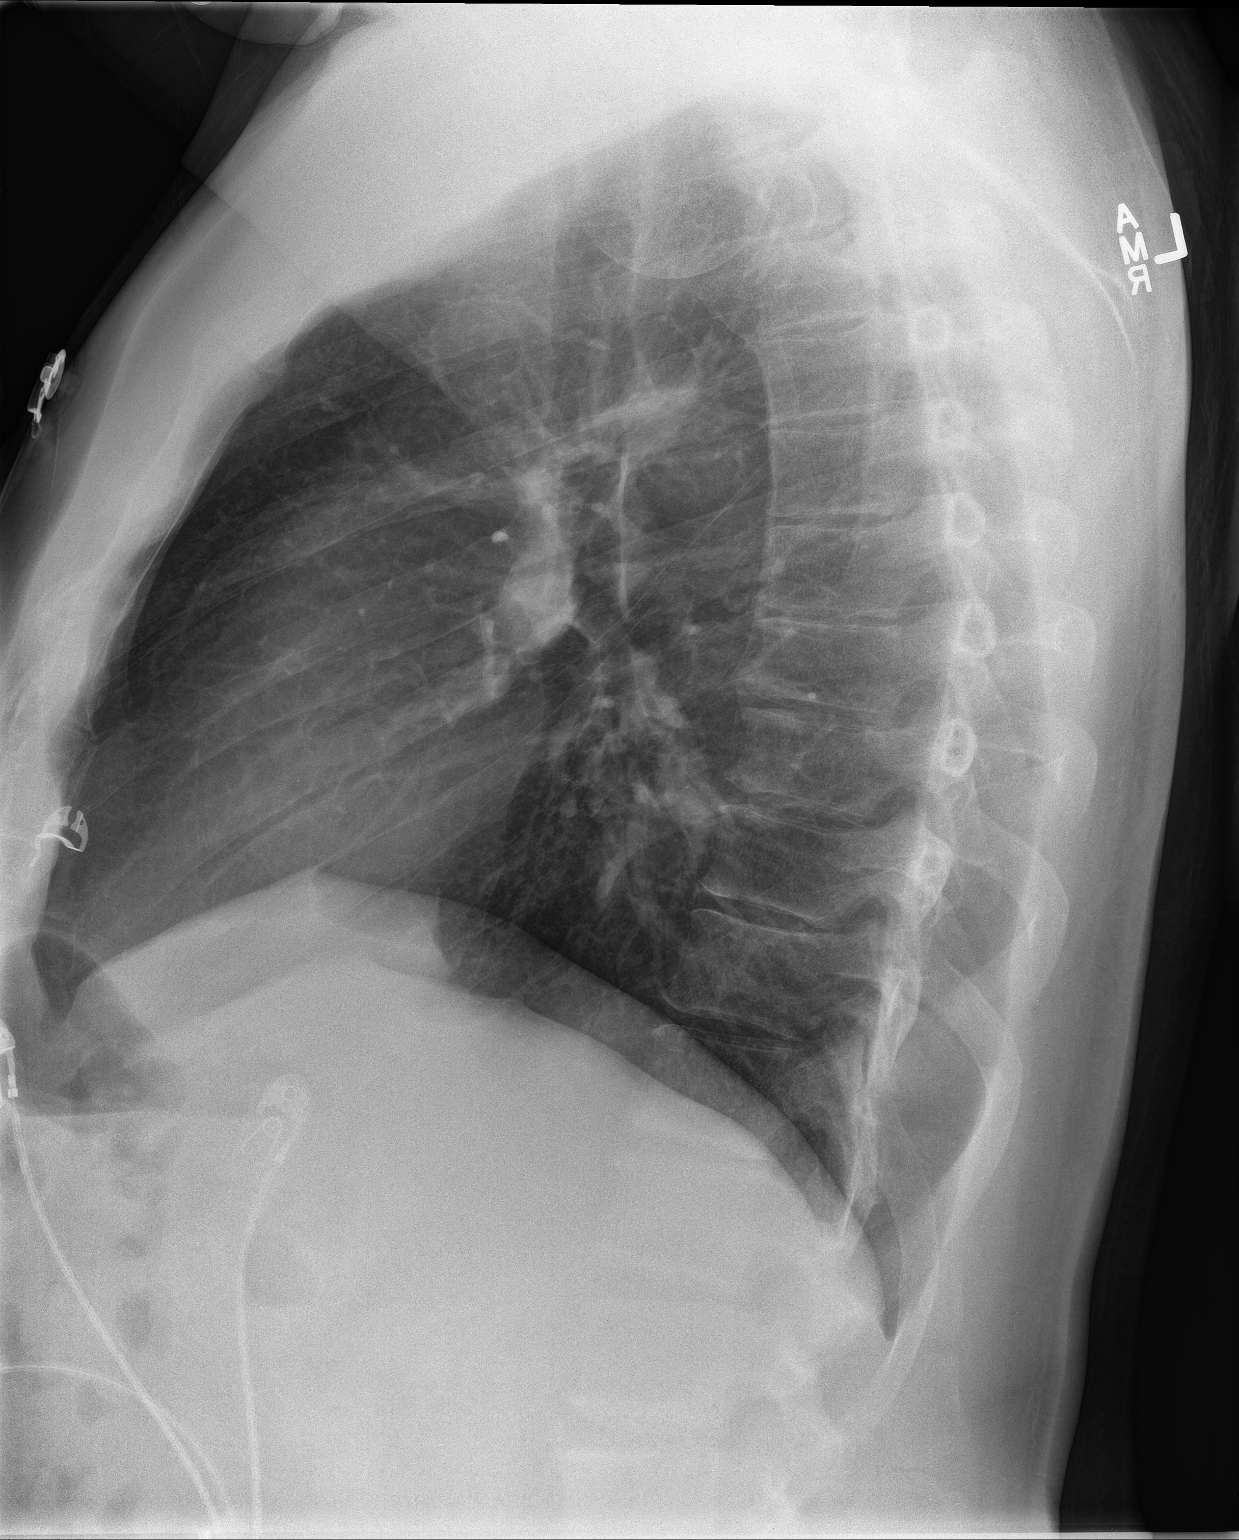

[2 of 2 positions shown; findings below may reference images not displayed]

FINDINGS: Normal mediastinum and cardiac silhouette. Normal pulmonary
vasculature. No evidence of effusion, infiltrate, or pneumothorax.
No acute bony abnormality.
IMPRESSION: No acute cardiopulmonary process.

## 2019-11-20 IMAGING — CT CT ABD-PELV W/ CM
2 of 4 series · 16 of 46 positions shown, 18 images · IV contrast (iopamidol)
Comparison: Abdominal ultrasound dated 10/11/2017

CLINICAL DATA: 56-year-old male with abdominal infection. Upper
abdominal and epigastric pain.

EXAM:
CT ABDOMEN AND PELVIS WITH CONTRAST
TECHNIQUE: Multidetector CT imaging of the abdomen and pelvis was performed
using the standard protocol following bolus administration of
intravenous contrast.
CONTRAST:  100mL [id number] IOPAMIDOL ([id number]) INJECTION 61%

[Series 2: axial st · axial · 0.93mm/px · z∈[+1160,+1620]mm · 13 of 102 slices shown, 15 images]
[im 5/102  soft-tissue]
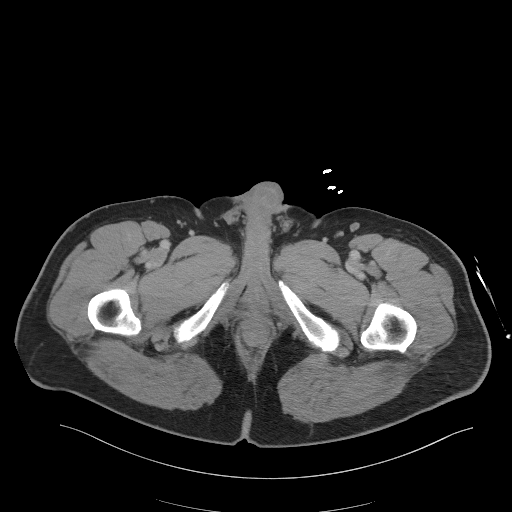
[im 5/102  bone]
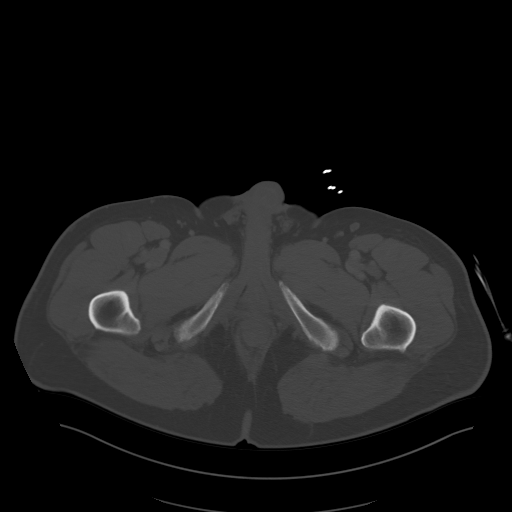
[im 14/102  soft-tissue]
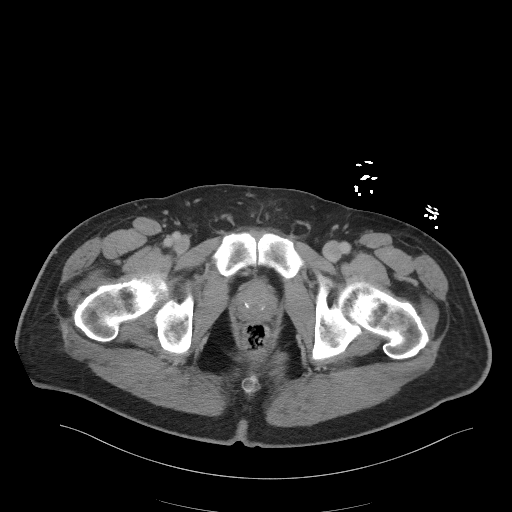
[im 23/102  soft-tissue]
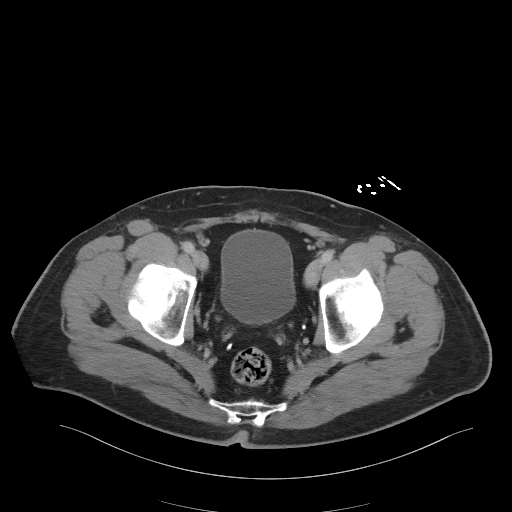
[im 28/102  soft-tissue]
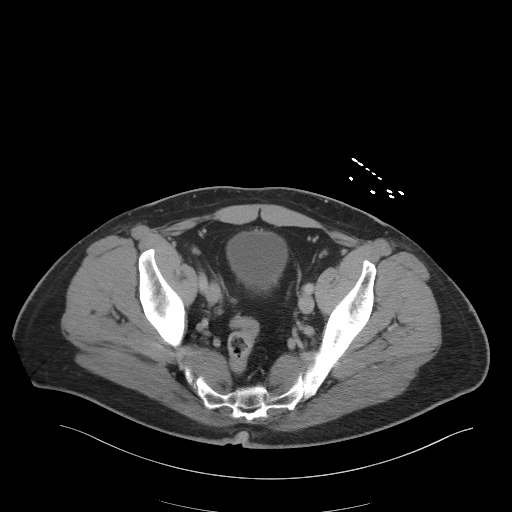
[im 37/102  soft-tissue]
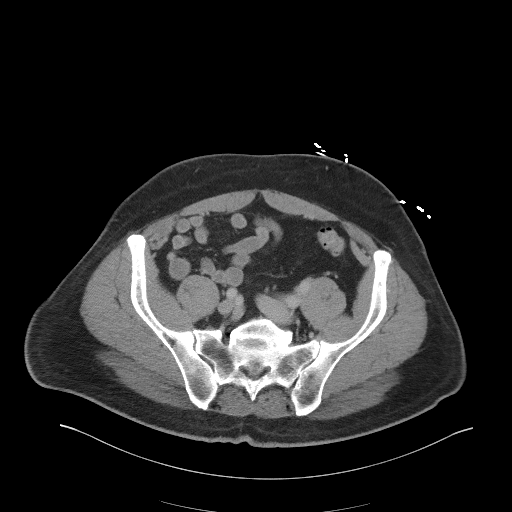
[im 42/102  soft-tissue]
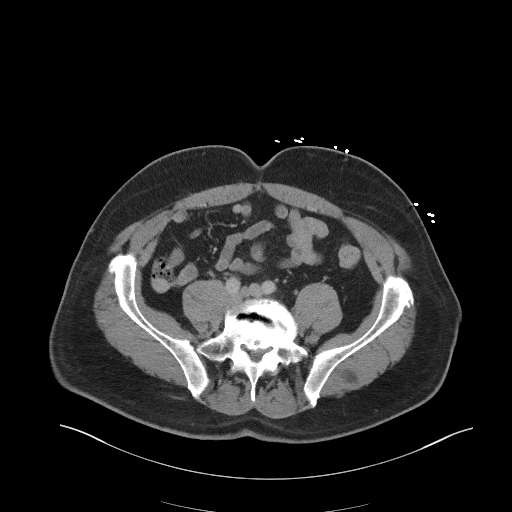
[im 51/102  soft-tissue]
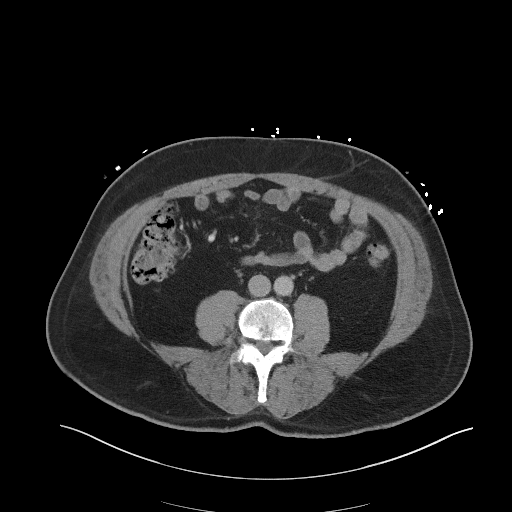
[im 60/102  soft-tissue]
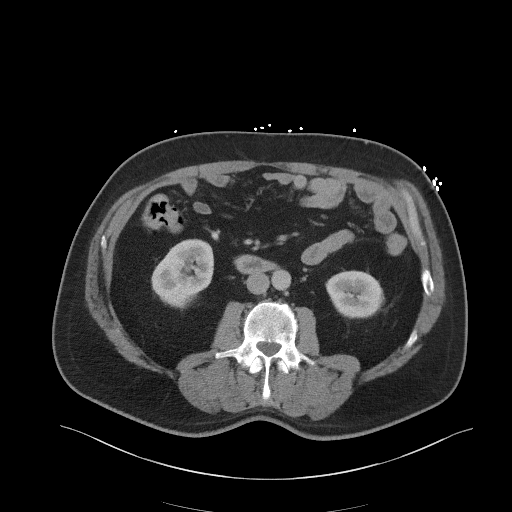
[im 65/102  soft-tissue]
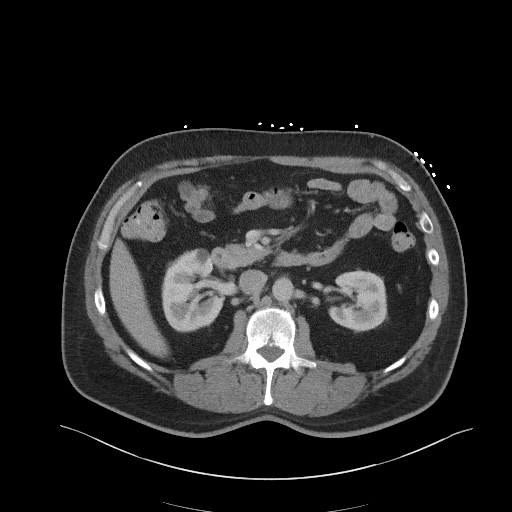
[im 65/102  bone]
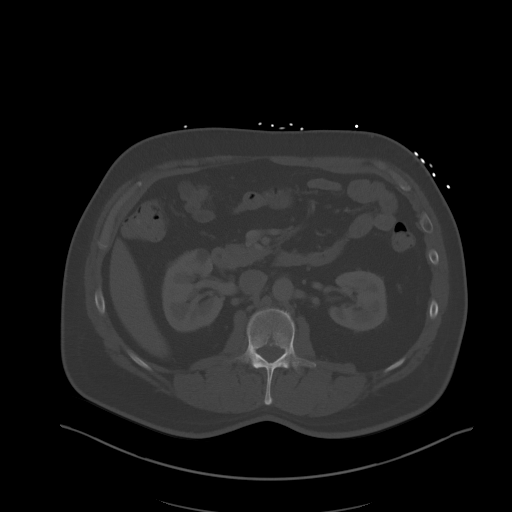
[im 74/102  soft-tissue]
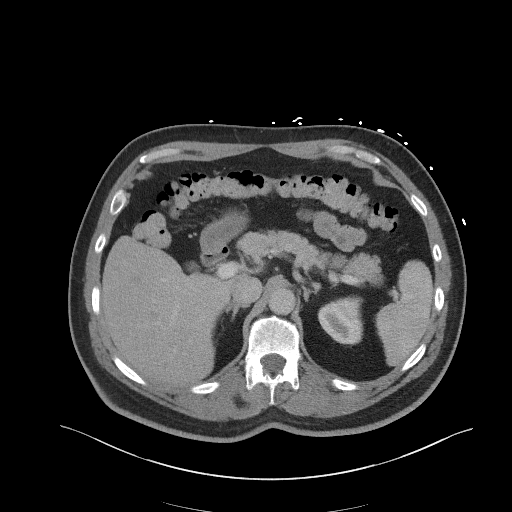
[im 79/102  soft-tissue]
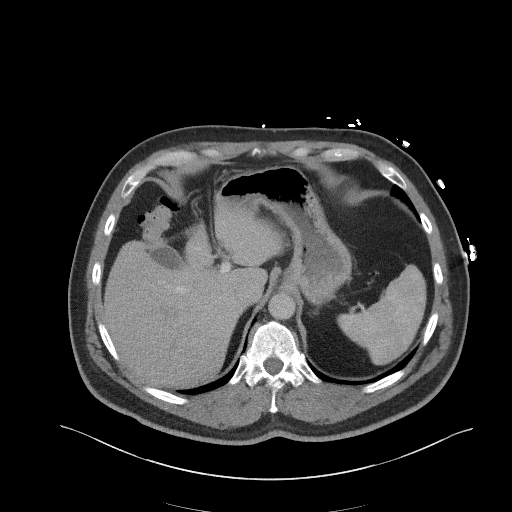
[im 88/102  soft-tissue]
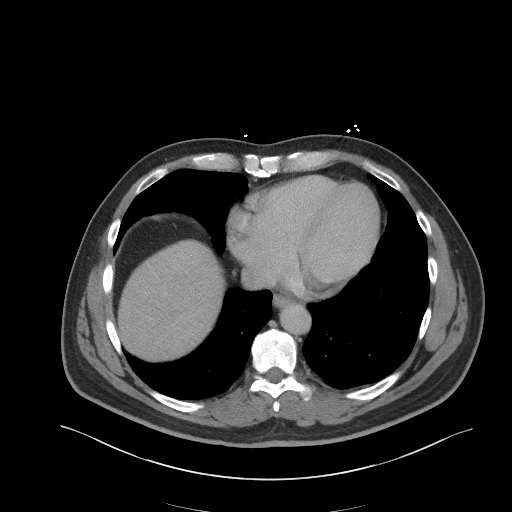
[im 97/102  soft-tissue]
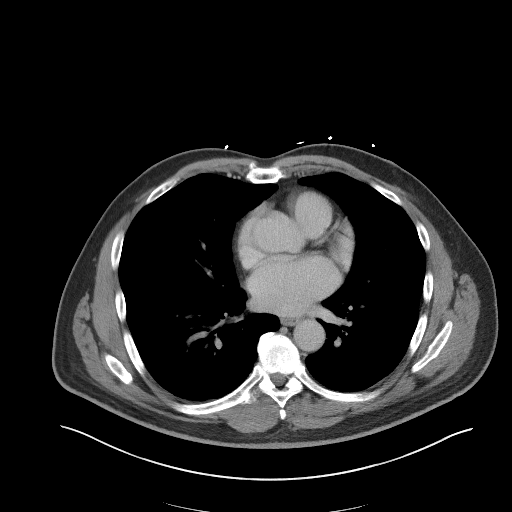

[Series 5: coronal st · coronal · 0.88mm/px · 3 of 97 slices shown]
[im 33/97  soft-tissue]
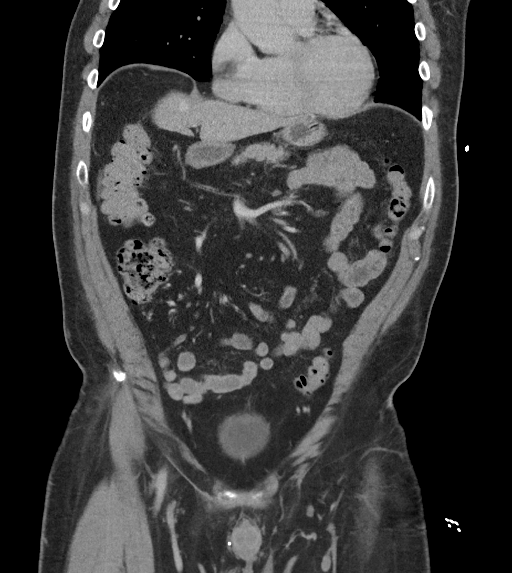
[im 43/97  soft-tissue]
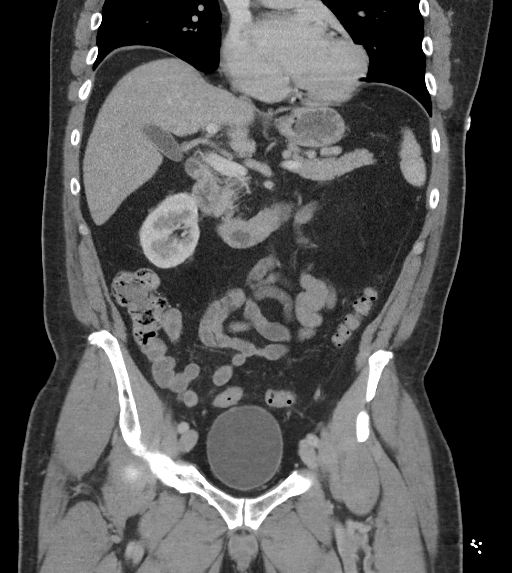
[im 54/97  soft-tissue]
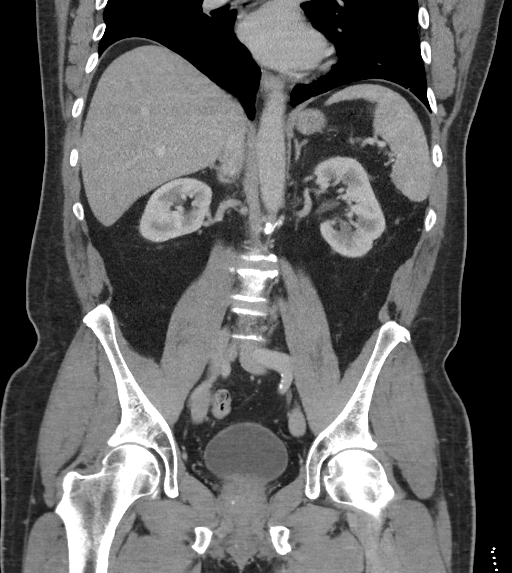

[16 of 46 positions shown; findings below may reference images not displayed]

FINDINGS: Lower chest: There is a 4 mm subpleural nodule at the left lung
base. The visualized lung bases are otherwise clear.

No intra-abdominal free air or free fluid.

Hepatobiliary: Subcentimeter hypodense lesion in the left lobe of
the liver is too small to characterize but likely represents a cyst
or hemangioma. The liver is otherwise unremarkable. No intrahepatic
biliary ductal dilatation. The gallbladder is unremarkable.

Pancreas: Unremarkable. No pancreatic ductal dilatation or
surrounding inflammatory changes.

Spleen: Normal in size without focal abnormality.

Adrenals/Urinary Tract: A 1 cm right renal hypodense lesion most
consistent with a cyst. The kidney is otherwise unremarkable. There
is symmetric enhancement and excretion of contrast by both kidneys.
The visualized ureters and urinary bladder appear unremarkable.

Stomach/Bowel: There is scattered colonic diverticula without active
inflammatory changes. There is no evidence of bowel obstruction or
active inflammation. Normal appendix.

Vascular/Lymphatic: Mild aortoiliac atherosclerotic disease. The
abdominal aorta and IVC are otherwise unremarkable. No portal venous
gas. There is no adenopathy.

Reproductive: The prostate and seminal vesicles are grossly
unremarkable.

Other: Small fat containing umbilical hernia.

Musculoskeletal: Degenerative changes of the lumbar spine with disc
desiccation and vacuum phenomena. No acute osseous pathology.
IMPRESSION: 1. No acute intra-abdominal or pelvic pathology. No bowel
obstruction or active inflammation. Normal appendix.
2. Mild colonic diverticulosis.
3. Mild Aortic Atherosclerosis ([id number]).

## 2019-11-25 ENCOUNTER — Other Ambulatory Visit: Payer: Self-pay

## 2019-11-25 ENCOUNTER — Emergency Department (HOSPITAL_COMMUNITY)
Admission: EM | Admit: 2019-11-25 | Discharge: 2019-11-25 | Disposition: A | Discharge status: Home / Self Care | Payer: Medicare Other | Attending: Emergency Medicine | Admitting: Emergency Medicine

## 2019-11-25 ENCOUNTER — Emergency Department (HOSPITAL_COMMUNITY): Payer: Medicare Other

## 2019-11-25 ENCOUNTER — Encounter (HOSPITAL_COMMUNITY): Payer: Self-pay

## 2019-11-25 DIAGNOSIS — N200 Calculus of kidney: Secondary | ICD-10-CM | POA: Diagnosis not present

## 2019-11-25 DIAGNOSIS — N132 Hydronephrosis with renal and ureteral calculous obstruction: Secondary | ICD-10-CM | POA: Diagnosis not present

## 2019-11-25 DIAGNOSIS — M5489 Other dorsalgia: Secondary | ICD-10-CM | POA: Diagnosis not present

## 2019-11-25 DIAGNOSIS — I10 Essential (primary) hypertension: Secondary | ICD-10-CM | POA: Diagnosis not present

## 2019-11-25 DIAGNOSIS — R109 Unspecified abdominal pain: Secondary | ICD-10-CM | POA: Diagnosis present

## 2019-11-25 DIAGNOSIS — Z87891 Personal history of nicotine dependence: Secondary | ICD-10-CM | POA: Diagnosis not present

## 2019-11-25 DIAGNOSIS — I712 Thoracic aortic aneurysm, without rupture: Secondary | ICD-10-CM | POA: Diagnosis not present

## 2019-11-25 DIAGNOSIS — R52 Pain, unspecified: Secondary | ICD-10-CM | POA: Diagnosis not present

## 2019-11-25 DIAGNOSIS — R0789 Other chest pain: Secondary | ICD-10-CM | POA: Diagnosis not present

## 2019-11-25 DIAGNOSIS — I959 Hypotension, unspecified: Secondary | ICD-10-CM | POA: Diagnosis not present

## 2019-11-25 DIAGNOSIS — R5381 Other malaise: Secondary | ICD-10-CM | POA: Diagnosis not present

## 2019-11-25 LAB — URINALYSIS, ROUTINE W REFLEX MICROSCOPIC
Bacteria, UA: NONE SEEN
Bilirubin Urine: NEGATIVE
Glucose, UA: NEGATIVE mg/dL
Ketones, ur: NEGATIVE mg/dL
Leukocytes,Ua: NEGATIVE
Nitrite: NEGATIVE
Protein, ur: NEGATIVE mg/dL
RBC / HPF: >50 RBC/hpf — ABNORMAL HIGH (ref 0–5)
Specific Gravity, Urine: >1.046 — ABNORMAL HIGH (ref 1.005–1.030)
pH: 6 (ref 5.0–8.0)

## 2019-11-25 LAB — CBC WITH DIFFERENTIAL/PLATELET
Abs Immature Granulocytes: 0.03 10*3/uL (ref 0.00–0.07)
Basophils Absolute: 0.1 10*3/uL (ref 0.0–0.1)
Basophils Relative: 1 %
Eosinophils Absolute: 0.2 10*3/uL (ref 0.0–0.5)
Eosinophils Relative: 2 %
HCT: 44.4 % (ref 39.0–52.0)
Hemoglobin: 14.4 g/dL (ref 13.0–17.0)
Immature Granulocytes: 0 %
Lymphocytes Relative: 24 %
Lymphs Abs: 2.6 10*3/uL (ref 0.7–4.0)
MCH: 29.8 pg (ref 26.0–34.0)
MCHC: 32.4 g/dL (ref 30.0–36.0)
MCV: 91.7 fL (ref 80.0–100.0)
Monocytes Absolute: 0.7 10*3/uL (ref 0.1–1.0)
Monocytes Relative: 7 %
Neutro Abs: 7.2 10*3/uL (ref 1.7–7.7)
Neutrophils Relative %: 66 %
Platelets: 236 10*3/uL (ref 150–400)
RBC: 4.84 MIL/uL (ref 4.22–5.81)
RDW: 13.1 % (ref 11.5–15.5)
WBC: 10.8 10*3/uL — ABNORMAL HIGH (ref 4.0–10.5)
nRBC: 0 % (ref 0.0–0.2)

## 2019-11-25 LAB — BASIC METABOLIC PANEL
Anion gap: 9 (ref 5–15)
BUN: 15 mg/dL (ref 6–20)
CO2: 27 mmol/L (ref 22–32)
Calcium: 8.9 mg/dL (ref 8.9–10.3)
Chloride: 105 mmol/L (ref 98–111)
Creatinine, Ser: 0.99 mg/dL (ref 0.61–1.24)
GFR calc Af Amer: >60 mL/min (ref >60)
GFR calc non Af Amer: >60 mL/min (ref >60)
Glucose, Bld: 122 mg/dL — ABNORMAL HIGH (ref 70–99)
Potassium: 4 mmol/L (ref 3.5–5.1)
Sodium: 141 mmol/L (ref 135–145)

## 2019-11-25 MED ORDER — ONDANSETRON HCL 4 MG PO TABS: 4.0000 mg | ORAL_TABLET | Freq: Three times a day (TID) | ORAL | 0 refills | Status: DC | PRN

## 2019-11-25 MED ORDER — TAMSULOSIN HCL 0.4 MG PO CAPS: 0.4000 mg | ORAL_CAPSULE | Freq: Every day | ORAL | 0 refills | Status: DC

## 2019-11-25 MED ORDER — HYDROMORPHONE HCL 1 MG/ML IJ SOLN
1.0000 mg | Freq: Once | INTRAMUSCULAR | Status: AC
  Administered 2019-11-25: 01:00:00 1 mg via INTRAVENOUS
  Filled 2019-11-25: qty 1

## 2019-11-25 MED ORDER — OXYCODONE HCL 5 MG PO TABS
5.0000 mg | ORAL_TABLET | Freq: Once | ORAL | Status: AC
  Administered 2019-11-25: 5 mg via ORAL
  Filled 2019-11-25: qty 1

## 2019-11-25 MED ORDER — ONDANSETRON HCL 4 MG/2ML IJ SOLN
4.0000 mg | Freq: Once | INTRAMUSCULAR | Status: AC
  Administered 2019-11-25: 01:00:00 4 mg via INTRAVENOUS
  Filled 2019-11-25: qty 2

## 2019-11-25 MED ORDER — IOHEXOL 350 MG/ML SOLN
100.0000 mL | Freq: Once | INTRAVENOUS | Status: AC | PRN
  Administered 2019-11-25: 100 mL via INTRAVENOUS

## 2019-11-25 MED ORDER — TAMSULOSIN HCL 0.4 MG PO CAPS
0.4000 mg | ORAL_CAPSULE | Freq: Once | ORAL | Status: AC
  Administered 2019-11-25: 0.4 mg via ORAL
  Filled 2019-11-25: qty 1

## 2019-11-25 MED ORDER — LACTATED RINGERS IV BOLUS
1000.0000 mL | Freq: Once | INTRAVENOUS | Status: AC
  Administered 2019-11-25: 1000 mL via INTRAVENOUS

## 2019-11-25 MED ORDER — KETOROLAC TROMETHAMINE 30 MG/ML IJ SOLN
15.0000 mg | Freq: Once | INTRAMUSCULAR | Status: AC
  Administered 2019-11-25: 06:00:00 15 mg via INTRAVENOUS
  Filled 2019-11-25: qty 1

## 2019-11-25 MED ORDER — LACTATED RINGERS IV BOLUS
1000.0000 mL | Freq: Once | INTRAVENOUS | Status: AC
  Administered 2019-11-25: 04:00:00 1000 mL via INTRAVENOUS

## 2019-11-25 MED ORDER — HYDROMORPHONE HCL 1 MG/ML IJ SOLN
1.0000 mg | Freq: Once | INTRAMUSCULAR | Status: AC
  Administered 2019-11-25: 02:00:00 1 mg via INTRAVENOUS
  Filled 2019-11-25: qty 1

## 2019-11-25 NOTE — ED Provider Notes (Signed)
Emergency Department Provider Note   I have reviewed the triage vital signs and the nursing notes.   HISTORY  Chief Complaint Flank Pain (left)   HPI Ronald Conner is a 59 y.o. male who presents to the ED with left flank pain. States he had an episode last night that went away pretty quickly. Tonight it came back after eating spicy food and did not improve with vicodin.    He has not noticed any changes to his urination or bowel movements.  He has some nausea secondary to the severe 10 out of 10 pain.  Nothing else tried for relief.  No history of getting similar.  No history of kidney stones and knows of no history of diverticulitis.  No other associated or modifying symptoms.    Past Medical History:  Diagnosis Date  . Acid reflux   . Atypical chest pain   . Family history of adverse reaction to anesthesia    MOM- PONV  . GERD (gastroesophageal reflux disease) 10/03/2017  . Hypertension   . Premature atrial contractions   . PVC's (premature ventricular contractions)   . Sinus bradycardia     Patient Active Problem List   Diagnosis Date Noted  . Chronic cholecystitis   . GERD (gastroesophageal reflux disease) 10/03/2017  . Gastroesophageal reflux disease without esophagitis 10/03/2017    Past Surgical History:  Procedure Laterality Date  . CHOLECYSTECTOMY N/A 11/07/2017   Procedure: LAPAROSCOPIC CHOLECYSTECTOMY;  Surgeon: Aviva Signs, MD;  Location: [facility];  Service: General;  Laterality: N/A;  . COLONOSCOPY    . ESOPHAGOGASTRODUODENOSCOPY N/A 10/07/2017   Procedure: ESOPHAGOGASTRODUODENOSCOPY (EGD);  Surgeon: Rogene Houston, MD;  Location: [facility];  Service: Endoscopy;  Laterality: N/A;  12:00pm  . GALLBLADDER SURGERY  11/07/2017  . NO PAST SURGERIES      Current Outpatient Rx  . Order #: [id number] Class: Historical Med  . Order #: [id number] Class: Historical Med  . Order #: [id number] Class: Print  . Order #: [id number] Class: Historical Med  . Order  #: [id number] Class: Print  . Order #: [id number] Class: Historical Med  . Order #: [id number] Class: Normal  . Order #: [id number] Class: Normal    Allergies Clonidine derivatives, Lisinopril, Protonix [pantoprazole sodium], and Iodine  Family History  Problem Relation Age of Onset  . Arrhythmia Mother        h/o DCCV  . Leukemia Mother   . Hypertension Mother   . Cataracts Mother   . Peripheral Artery Disease Mother   . Heart attack Father 23  . Kidney failure Brother        on dialysis  . Hypertension Brother   . Arrhythmia Maternal Grandmother   . Heart attack Maternal Grandmother   . Stroke Maternal Grandfather   . Peripheral Artery Disease Maternal Grandfather        double amputee  . Dementia Paternal Grandmother   . Diabetes Brother   . Obesity Brother     Social History Social History   Tobacco Use  . Smoking status: Former Smoker    Types: Cigars    Quit date: 11/05/2007    Years since quitting: 12.0  . Smokeless tobacco: Former Systems developer    Types: Snuff    Quit date: 11/05/2015  . Tobacco comment: dips snuff for 25 years.cigar once or twicea year  Substance Use Topics  . Alcohol use: No  . Drug use: No    Review of Systems  All other systems negative except as documented in the HPI. All pertinent  positives and negatives as reviewed in the HPI. ____________________________________________   PHYSICAL EXAM:  VITAL SIGNS: ED Triage Vitals  Enc Vitals Group     BP 11/25/19 0035 109/87     Pulse Rate 11/25/19 0035 (!) 52     Resp 11/25/19 0035 20     Temp 11/25/19 0035 98.6 F (37 C)     Temp Source 11/25/19 0035 Oral     SpO2 11/25/19 0035 100 %     Weight 11/25/19 0037 233 lb (105.7 kg)     Height 11/25/19 0037 5\' 9"  (1.753 m)    Constitutional: Alert and oriented. Well appearing and in no acute distress. Eyes: Conjunctivae are normal. PERRL. EOMI. Head: Atraumatic. Nose: No congestion/rhinnorhea. Mouth/Throat: Mucous membranes are moist.   Oropharynx non-erythematous. Neck: No stridor.  No meningeal signs.   Cardiovascular: Normal rate, regular rhythm. Good peripheral circulation. Grossly normal heart sounds.   Respiratory: Normal respiratory effort.  No retractions. Lungs CTAB. Gastrointestinal: Soft and nontender. No distention.  Musculoskeletal: No lower extremity tenderness nor edema. No gross deformities of extremities. Neurologic:  Normal speech and language. No gross focal neurologic deficits are appreciated.  Skin:  Skin is warm, dry and intact. No rash noted.  ____________________________________________   LABS (all labs ordered are listed, but only abnormal results are displayed)  Labs Reviewed  CBC WITH DIFFERENTIAL/PLATELET - Abnormal; Notable for the following components:      Result Value   WBC 10.8 (*)    All other components within normal limits  BASIC METABOLIC PANEL - Abnormal; Notable for the following components:   Glucose, Bld 122 (*)    All other components within normal limits  URINALYSIS, ROUTINE W REFLEX MICROSCOPIC - Abnormal; Notable for the following components:   APPearance HAZY (*)    Specific Gravity, Urine >1.046 (*)    Hgb urine dipstick LARGE (*)    RBC / HPF >50 (*)    All other components within normal limits   ____________________________________________  EKG   EKG Interpretation  Date/Time:  Sunday November 25 2019 01:35:11 EST Ventricular Rate:  52 PR Interval:    QRS Duration: 112 QT Interval:  459 QTC Calculation: 427 R Axis:   40 Text Interpretation: Junctional rhythm Ventricular premature complex Borderline intraventricular conduction delay slow rate similar to previous initially sinus then appears to be junctional rhythm Confirmed by Merrily Pew [phone]) on 11/25/2019 1:43:33 AM       ____________________________________________  RADIOLOGY  CT Angio Chest/Abd/Pel for Dissection W and/or Wo Contrast  Result Date: 11/25/2019 CLINICAL DATA:  Left-sided pain.  Evaluate for dissection. Flank pain. EXAM: CT ANGIOGRAPHY CHEST, ABDOMEN AND PELVIS TECHNIQUE: Multidetector CT imaging through the chest, abdomen and pelvis was performed using the standard protocol during bolus administration of intravenous contrast. Multiplanar reconstructed images and MIPs were obtained and reviewed to evaluate the vascular anatomy. CONTRAST:  165mL OMNIPAQUE IOHEXOL 350 MG/ML SOLN COMPARISON:  Abdominopelvic CT 07/18/2018 FINDINGS: CTA CHEST FINDINGS Cardiovascular: No aortic dissection. No aortic hematoma or evidence of acute aortic syndrome. No significant atherosclerosis. Slight ectasia of the ascending aorta at 4 cm. Conventional branching pattern from the aortic arch. Limited assessment for pulmonary embolus given phase of contrast tailored to aortic evaluation. Heart is normal in size. No pericardial effusion. Mediastinum/Nodes: No enlarged mediastinal or hilar lymph nodes. Slight heterogeneity of the right lobe of the thyroid gland without dominant nodule. No esophageal wall thickening. Lungs/Pleura: Mild linear atelectasis in the left greater than right lower lobe. Lungs otherwise clear.  No pulmonary edema. No pleural fluid. Trachea and mainstem bronchi are patent. No pulmonary nodule. Musculoskeletal: There are no acute or suspicious osseous abnormalities. Review of the MIP images confirms the above findings. CTA ABDOMEN AND PELVIS FINDINGS VASCULAR Aorta: Normal. Celiac: Normal. SMA: Normal. Renals: Single bilateral renal arteries appear normal. IMA: Normal. Inflow: Mild atherosclerosis. No dissection or aneurysm. No significant stenosis. Veins: No obvious venous abnormality within the limitations of this arterial phase study. Portal vein is patent. Review of the MIP images confirms the above findings. NON-VASCULAR Hepatobiliary: Probable hepatic steatosis. No focal lesion. Clips in the gallbladder fossa postcholecystectomy. No biliary dilatation. Pancreas: No ductal dilatation or  inflammation. Spleen: Normal in size. Normal arterial enhancement. Adrenals/Urinary Tract: Normal adrenal glands. Obstructing 3 mm stone in the left mid ureter with mild hydronephrosis and perinephric edema. More distal ureters decompressed. No right hydronephrosis. Small cyst in the upper right kidney. Urinary bladder is partially distended. Stomach/Bowel: Mild colonic diverticulosis without diverticulitis. No bowel obstruction or acute inflammation. Normal appendix. Stomach is unremarkable. Lymphatic: No adenopathy. Reproductive: Prostate is unremarkable. Other: No free air or free fluid. Small fat containing umbilical hernia. Minimal fat in the inguinal canals. Musculoskeletal: Degenerative change at the lumbosacral junction. There are no acute or suspicious osseous abnormalities. Review of the MIP images confirms the above findings. IMPRESSION: 1. Obstructing 3 mm stone in the left mid ureter with mild hydronephrosis and perinephric edema. 2. No aortic dissection or acute aortic abnormality. 3. Ectatic ascending aorta at 4 cm. Recommend annual imaging followup by CTA or MRA. This recommendation follows 2010 ACCF/AHA/AATS/ACR/ASA/SCA/SCAI/SIR/STS/SVM Guidelines for the Diagnosis and Management of Patients with Thoracic Aortic Disease. Circulation. 2010; [id number]. Aortic aneurysm NOS (ICD10-I71.9) Electronically Signed   By: Keith Rake M.D.   On: 11/25/2019 03:56    ____________________________________________   INITIAL IMPRESSION / ASSESSMENT AND PLAN / ED COURSE  Diverticulitis versus nephrolithiasis.  Will treat symptomatically at this time.  White count slightly elevated pain not significantly improved.  Will CT for diverticulitis. Also consider possible dissection but less likely. Still pending UA and BMP.  Ct with nephrolithiasis, will treat for same.   Pertinent labs & imaging results that were available during my care of the patient were reviewed by me and considered in my medical  decision making (see chart for details).  A medical screening exam was performed and I feel the patient has had an appropriate workup for their chief complaint at this time and likelihood of emergent condition existing is low. They have been counseled on decision, discharge, follow up and which symptoms necessitate immediate return to the emergency department. They or their family verbally stated understanding and agreement with plan and discharged in stable condition.   ____________________________________________  FINAL CLINICAL IMPRESSION(S) / ED DIAGNOSES  Final diagnoses:  Kidney stone     MEDICATIONS GIVEN DURING THIS VISIT:  Medications  HYDROmorphone (DILAUDID) injection 1 mg (1 mg Intravenous Given 11/25/19 0121)  lactated ringers bolus 1,000 mL (0 mLs Intravenous Stopped 11/25/19 0213)  ondansetron (ZOFRAN) injection 4 mg (4 mg Intravenous Given 11/25/19 0121)  HYDROmorphone (DILAUDID) injection 1 mg (1 mg Intravenous Given 11/25/19 0213)  lactated ringers bolus 1,000 mL (0 mLs Intravenous Stopped 11/25/19 0449)  iohexol (OMNIPAQUE) 350 MG/ML injection 100 mL (100 mLs Intravenous Contrast Given 11/25/19 0346)  ketorolac (TORADOL) 30 MG/ML injection 15 mg (15 mg Intravenous Given 11/25/19 0546)  tamsulosin (FLOMAX) capsule 0.4 mg (0.4 mg Oral Given 11/25/19 0545)  oxyCODONE (Oxy IR/ROXICODONE) immediate release tablet 5  mg (5 mg Oral Given 11/25/19 0545)     NEW OUTPATIENT MEDICATIONS STARTED DURING THIS VISIT:  New Prescriptions   ONDANSETRON (ZOFRAN) 4 MG TABLET    Take 1 tablet (4 mg total) by mouth every 8 (eight) hours as needed for nausea or vomiting.   TAMSULOSIN (FLOMAX) 0.4 MG CAPS CAPSULE    Take 1 capsule (0.4 mg total) by mouth daily.    Note:  This note was prepared with assistance of Dragon voice recognition software. Occasional wrong-word or sound-a-like substitutions may have occurred due to the inherent limitations of voice recognition software.   Daquawn Seelman, Corene Cornea,  MD 11/25/19 [phone]

## 2019-11-25 NOTE — ED Triage Notes (Signed)
Pt reports sudden onset of flank pain that originally started in LLQ then moved to flank. Pt denies urinary sx. Pt reports taking hydrocodone and muscle relaxer that he has from chronic neck pain about an 1 1/2 hour ago with no relief.

## 2019-12-05 ENCOUNTER — Other Ambulatory Visit: Payer: Self-pay

## 2019-12-05 ENCOUNTER — Ambulatory Visit (INDEPENDENT_AMBULATORY_CARE_PROVIDER_SITE_OTHER): Payer: Medicare Other | Admitting: Urology

## 2019-12-05 ENCOUNTER — Encounter: Payer: Self-pay | Admitting: Urology

## 2019-12-05 VITALS — BP 119/75 | HR 68 | Temp 97.3°F | Ht 69.0 in | Wt 233.0 lb

## 2019-12-05 DIAGNOSIS — I10 Essential (primary) hypertension: Secondary | ICD-10-CM | POA: Insufficient documentation

## 2019-12-05 DIAGNOSIS — N200 Calculus of kidney: Secondary | ICD-10-CM | POA: Diagnosis not present

## 2019-12-05 LAB — POCT URINALYSIS DIPSTICK
Glucose, UA: NEGATIVE
Ketones, UA: NEGATIVE
Nitrite, UA: NEGATIVE
Protein, UA: NEGATIVE
Spec Grav, UA: >=1.03 — AB (ref 1.010–1.025)
Urobilinogen, UA: NEGATIVE E.U./dL — AB
pH, UA: 5 (ref 5.0–8.0)

## 2019-12-05 MED ORDER — OXYCODONE-ACETAMINOPHEN 5-325 MG PO TABS: 1.0000 | ORAL_TABLET | ORAL | 0 refills | Status: DC | PRN

## 2019-12-05 MED ORDER — TAMSULOSIN HCL 0.4 MG PO CAPS: 0.4000 mg | ORAL_CAPSULE | Freq: Every day | ORAL | 0 refills | Status: DC

## 2019-12-05 MED ORDER — ONDANSETRON 4 MG PO TBDP: 4.0000 mg | ORAL_TABLET | Freq: Three times a day (TID) | ORAL | 0 refills | Status: DC | PRN

## 2019-12-05 NOTE — Patient Instructions (Signed)

## 2019-12-05 NOTE — Progress Notes (Signed)
Urological Symptom Review  Patient is experiencing the following symptoms: Frequent urination  Burning/pain w/ urination  nocturia   Review of Systems  Gastrointestinal (upper)  : Negative for upper GI symptoms  Gastrointestinal (lower) : Negative for lower GI symptoms  Constitutional : Negative for symptoms  Skin: Negative for skin symptoms  Eyes: Negative for eye symptoms  Ear/Nose/Throat : Negative for Ear/Nose/Throat symptoms  Hematologic/Lymphatic: Negative for Hematologic/Lymphatic symptoms  Cardiovascular : Negative for cardiovascular symptoms  Respiratory : Negative for respiratory symptoms  Endocrine: Negative for endocrine symptoms  Musculoskeletal: Negative for musculoskeletal symptoms  Neurological: Negative for neurological symptoms  Psychologic: Negative for psychiatric symptoms

## 2019-12-05 NOTE — Progress Notes (Signed)
12/05/2019 10:34 AM   Ronald Conner Mar 15, 1961 [id number]  Referring provider: Celene Squibb, MD [address] [name],  [name] [address]  Chief Complaint  Patient presents with  . Nephrolithiasis    HPI: Ronald Conner is a 59yo who presents today for evaluation for left ureteral calculus. No previous hx of nephrolithiasis. The pain is sharp, intermittent, mild to moderate and nonraditing. He was diagnosed with a left 22m ureteral calculus on CT. He is currently on flomax 0.455m He is now urinating frequently, urgency. No dysuria. No current flank pain. UA today shows blood   PMH: Past Medical History:  Diagnosis Date  . Acid reflux   . Atypical chest pain   . Family history of adverse reaction to anesthesia    MOM- PONV  . GERD (gastroesophageal reflux disease) 10/03/2017  . Hypertension   . Premature atrial contractions   . PVC's (premature ventricular contractions)   . Sinus bradycardia     Surgical History: Past Surgical History:  Procedure Laterality Date  . CHOLECYSTECTOMY N/A 11/07/2017   Procedure: LAPAROSCOPIC CHOLECYSTECTOMY;  Surgeon: JeAviva SignsMD;  Location: [facility];  Service: General;  Laterality: N/A;  . COLONOSCOPY    . ESOPHAGOGASTRODUODENOSCOPY N/A 10/07/2017   Procedure: ESOPHAGOGASTRODUODENOSCOPY (EGD);  Surgeon: ReRogene HoustonMD;  Location: [facility];  Service: Endoscopy;  Laterality: N/A;  12:00pm  . GALLBLADDER SURGERY  11/07/2017  . NO PAST SURGERIES      Home Medications:  Allergies as of 12/05/2019      Reactions   Clonidine Derivatives Other (See Comments)   Constipation-   Lisinopril Other (See Comments), Cough   Severe back pain   Protonix [pantoprazole Sodium] Other (See Comments)   Lightheaded, head aches   Iodine Rash      Medication List       Accurate as of December 05, 2019 10:34 AM. If you have any questions, ask your nurse or doctor.        cetirizine 10 MG tablet Commonly known as: ZYRTEC Take 10 mg by  mouth daily.   cyclobenzaprine 10 MG tablet Commonly known as: FLEXERIL Take 1 tablet (10 mg total) by mouth 3 (three) times daily as needed for muscle spasms.   docusate sodium 100 MG capsule Commonly known as: COLACE Take 100 mg by mouth daily as needed for mild constipation.   HYDROcodone-acetaminophen 5-325 MG tablet Commonly known as: NORCO/VICODIN Take 1 tablet by mouth every 6 (six) hours as needed for moderate pain.   losartan 100 MG tablet Commonly known as: COZAAR TK 1 T PO QAM FOR BLOOD PRESSURE   multivitamin tablet Take 1 tablet by mouth daily.   ondansetron 4 MG tablet Commonly known as: ZOFRAN Take 1 tablet (4 mg total) by mouth every 8 (eight) hours as needed for nausea or vomiting.   PROBIOTIC FORMULA PO Take 1 tablet by mouth daily.   tamsulosin 0.4 MG Caps capsule Commonly known as: Flomax Take 1 capsule (0.4 mg total) by mouth daily.       Allergies:  Allergies  Allergen Reactions  . Clonidine Derivatives Other (See Comments)    Constipation-  . Lisinopril Other (See Comments) and Cough    Severe back pain  . Protonix [Pantoprazole Sodium] Other (See Comments)    Lightheaded, head aches  . Iodine Rash    Family History: Family History  Problem Relation Age of Onset  . Arrhythmia Mother        h/o DCCV  .  Leukemia Mother   . Hypertension Mother   . Cataracts Mother   . Peripheral Artery Disease Mother   . Kidney failure Mother   . Heart attack Father 17  . Kidney failure Brother        on dialysis  . Hypertension Brother   . Arrhythmia Maternal Grandmother   . Heart attack Maternal Grandmother   . Stroke Maternal Grandfather   . Peripheral Artery Disease Maternal Grandfather        double amputee  . Dementia Paternal Grandmother   . Diabetes Brother   . Obesity Brother     Social History:  reports that he quit smoking about 12 years ago. His smoking use included cigars. He quit smokeless tobacco use about 4 years ago.  His  smokeless tobacco use included snuff. He reports that he does not drink alcohol or use drugs.  ROS:                                        Physical Exam: BP 119/75   Pulse 68   Temp (!) 97.3 F (36.3 C)   Ht _0  (1.753 m)   Wt 233 lb (105.7 kg)   BMI 34.41 kg/m   Constitutional:  Alert and oriented, No acute distress. HEENT: [place] AT, moist mucus membranes.  Trachea midline, no masses. Cardiovascular: No clubbing, cyanosis, or edema. Respiratory: Normal respiratory effort, no increased work of breathing. GI: Abdomen is soft, nontender, nondistended, no abdominal masses GU: No CVA tenderness Lymph: No cervical or inguinal lymphadenopathy. Skin: No rashes, bruises or suspicious lesions. Neurologic: Grossly intact, no focal deficits, moving all 4 extremities. Psychiatric: Normal mood and affect.  Laboratory Data: Lab Results  Component Value Date   WBC 10.8 (H) 11/25/2019   HGB 14.4 11/25/2019   HCT 44.4 11/25/2019   MCV 91.7 11/25/2019   PLT 236 11/25/2019    Lab Results  Component Value Date   CREATININE 0.99 11/25/2019    No results found for: PSA  No results found for: TESTOSTERONE  No results found for: HGBA1C  Urinalysis    Component Value Date/Time   COLORURINE YELLOW 11/25/2019 0425   APPEARANCEUR HAZY (A) 11/25/2019 0425   LABSPEC >1.046 (H) 11/25/2019 0425   PHURINE 6.0 11/25/2019 0425   GLUCOSEU NEGATIVE 11/25/2019 0425   HGBUR LARGE (A) 11/25/2019 0425   BILIRUBINUR small 12/05/2019 1021   KETONESUR NEGATIVE 11/25/2019 0425   PROTEINUR Negative 12/05/2019 1021   PROTEINUR NEGATIVE 11/25/2019 0425   UROBILINOGEN negative (A) 12/05/2019 1021   NITRITE neg 12/05/2019 1021   NITRITE NEGATIVE 11/25/2019 0425   LEUKOCYTESUR Trace (A) 12/05/2019 1021   LEUKOCYTESUR NEGATIVE 11/25/2019 0425    Lab Results  Component Value Date   BACTERIA NONE SEEN 11/25/2019    Pertinent Imaging: CT chest/abd/pelvis w/wo contract No  results found for this or any previous visit. No results found for this or any previous visit. No results found for this or any previous visit. No results found for this or any previous visit. No results found for this or any previous visit. No results found for this or any previous visit. No results found for this or any previous visit. No results found for this or any previous visit.  Assessment & Plan:    1. Kidney stones -We discussed MET, ESWL and URS and the patient elects for MET. rx for percocet, flomax, zofran given -  POCT urinalysis dipstick     No follow-ups on file.  Nicolette Bang, MD  [facility] [place]

## 2019-12-19 ENCOUNTER — Ambulatory Visit: Payer: Medicare Other | Admitting: Urology

## 2020-03-19 ENCOUNTER — Emergency Department (HOSPITAL_COMMUNITY): Payer: Medicare Other

## 2020-03-19 ENCOUNTER — Other Ambulatory Visit: Payer: Self-pay | Admitting: *Deleted

## 2020-03-19 ENCOUNTER — Encounter (HOSPITAL_COMMUNITY): Payer: Self-pay

## 2020-03-19 ENCOUNTER — Ambulatory Visit: Payer: Medicare Other

## 2020-03-19 ENCOUNTER — Other Ambulatory Visit: Payer: Self-pay

## 2020-03-19 ENCOUNTER — Emergency Department (HOSPITAL_COMMUNITY)
Admission: EM | Admit: 2020-03-19 | Discharge: 2020-03-19 | Disposition: A | Discharge status: Home / Self Care | Payer: Medicare Other | Attending: Emergency Medicine | Admitting: Emergency Medicine

## 2020-03-19 DIAGNOSIS — Z79899 Other long term (current) drug therapy: Secondary | ICD-10-CM | POA: Insufficient documentation

## 2020-03-19 DIAGNOSIS — K219 Gastro-esophageal reflux disease without esophagitis: Secondary | ICD-10-CM | POA: Insufficient documentation

## 2020-03-19 DIAGNOSIS — R002 Palpitations: Secondary | ICD-10-CM | POA: Diagnosis present

## 2020-03-19 DIAGNOSIS — I480 Paroxysmal atrial fibrillation: Secondary | ICD-10-CM | POA: Diagnosis not present

## 2020-03-19 DIAGNOSIS — R000 Tachycardia, unspecified: Secondary | ICD-10-CM | POA: Diagnosis not present

## 2020-03-19 DIAGNOSIS — I10 Essential (primary) hypertension: Secondary | ICD-10-CM | POA: Insufficient documentation

## 2020-03-19 DIAGNOSIS — I4891 Unspecified atrial fibrillation: Secondary | ICD-10-CM

## 2020-03-19 DIAGNOSIS — R079 Chest pain, unspecified: Secondary | ICD-10-CM | POA: Diagnosis not present

## 2020-03-19 DIAGNOSIS — R0789 Other chest pain: Secondary | ICD-10-CM | POA: Insufficient documentation

## 2020-03-19 LAB — CK: Total CK: 163 U/L (ref 49–397)

## 2020-03-19 LAB — CBC WITH DIFFERENTIAL/PLATELET
Abs Immature Granulocytes: 0.01 10*3/uL (ref 0.00–0.07)
Basophils Absolute: 0.1 10*3/uL (ref 0.0–0.1)
Basophils Relative: 1 %
Eosinophils Absolute: 0.4 10*3/uL (ref 0.0–0.5)
Eosinophils Relative: 6 %
HCT: 44.5 % (ref 39.0–52.0)
Hemoglobin: 14.8 g/dL (ref 13.0–17.0)
Immature Granulocytes: 0 %
Lymphocytes Relative: 37 %
Lymphs Abs: 2.4 10*3/uL (ref 0.7–4.0)
MCH: 29.9 pg (ref 26.0–34.0)
MCHC: 33.3 g/dL (ref 30.0–36.0)
MCV: 89.9 fL (ref 80.0–100.0)
Monocytes Absolute: 0.5 10*3/uL (ref 0.1–1.0)
Monocytes Relative: 8 %
Neutro Abs: 3.2 10*3/uL (ref 1.7–7.7)
Neutrophils Relative %: 48 %
Platelets: 211 10*3/uL (ref 150–400)
RBC: 4.95 MIL/uL (ref 4.22–5.81)
RDW: 13 % (ref 11.5–15.5)
WBC: 6.6 10*3/uL (ref 4.0–10.5)
nRBC: 0 % (ref 0.0–0.2)

## 2020-03-19 LAB — COMPREHENSIVE METABOLIC PANEL
ALT: 21 U/L (ref 0–44)
AST: 22 U/L (ref 15–41)
Albumin: 3.7 g/dL (ref 3.5–5.0)
Alkaline Phosphatase: 64 U/L (ref 38–126)
Anion gap: 10 (ref 5–15)
BUN: 11 mg/dL (ref 8–23)
CO2: 26 mmol/L (ref 22–32)
Calcium: 9 mg/dL (ref 8.9–10.3)
Chloride: 106 mmol/L (ref 98–111)
Creatinine, Ser: 0.84 mg/dL (ref 0.61–1.24)
GFR calc Af Amer: >60 mL/min (ref >60)
GFR calc non Af Amer: >60 mL/min (ref >60)
Glucose, Bld: 103 mg/dL — ABNORMAL HIGH (ref 70–99)
Potassium: 3.6 mmol/L (ref 3.5–5.1)
Sodium: 142 mmol/L (ref 135–145)
Total Bilirubin: 0.7 mg/dL (ref 0.3–1.2)
Total Protein: 6.8 g/dL (ref 6.5–8.1)

## 2020-03-19 LAB — TROPONIN I (HIGH SENSITIVITY)
Troponin I (High Sensitivity): 6 ng/L (ref <18)
Troponin I (High Sensitivity): 8 ng/L (ref <18)

## 2020-03-19 LAB — TSH: TSH: 0.523 u[IU]/mL (ref 0.350–4.500)

## 2020-03-19 LAB — MAGNESIUM: Magnesium: 2.1 mg/dL (ref 1.7–2.4)

## 2020-03-19 MED ORDER — METOPROLOL TARTRATE 25 MG PO TABS
12.5000 mg | ORAL_TABLET | Freq: Two times a day (BID) | ORAL | 0 refills | Status: DC | PRN
Start: 2020-03-19 — End: 2022-03-19

## 2020-03-19 MED ORDER — SODIUM CHLORIDE 0.9% FLUSH: 3.0000 mL | Freq: Once | INTRAVENOUS | Status: DC

## 2020-03-19 NOTE — Discharge Instructions (Addendum)
As discussed, you had an episode of atrial fibrillation this morning which has resolved.  This is of unclear etiology.  We have spoken with Dr. Harl Bowie and he would like to see you in office follow-up.  In the interim he would like you to take the medication prescribed, but only take this medicine if you have another episode of palpitations and rapid heart rate like you experienced this morning.  Otherwise, continue taking your regular home medications.  Return here if you have any acute worsened symptoms in the interim.

## 2020-03-19 NOTE — ED Provider Notes (Signed)
[facility] EMERGENCY DEPARTMENT Provider Note   CSN: [id number] Arrival date & time: 03/19/20  1014     History Chief Complaint  Patient presents with  . Chest Pain    Ronald Conner is a 59 y.o. male with a history of hypertension, PVCs, sinus bradycardia, presenting for evaluation of substernal chest pressure and rapid heart rate.  He states he woke this morning feeling like his heart rate was more elevated than normal, but then after eating breakfast he had worsening symptoms in association with a very rapid heart rate.  He denies shortness of breath with this episode but stated that when he got to the end of his inspiratory phase of breathing the palpitations became more pronounced.  He described a midsternal chest pressure sensation with both symptoms now resolved.  EMS arrived at his home and documented atrial fibrillation, confirmed by the rhythm strips provided. He was given ASA 324 mg and ntg SL x 1 after which he spontaneously converted to NSR prior to arriving here.  He is currently sx free.  He denies diaphoresis, dizziness, nausea or vomiting with today's symptoms.  Of note, he received a shock last night around 8 pm from his bug zapper, describing a significant shock that traveled to his right upper arm, but he felt completely recovered from this by the time he went to bed.  HPI     Past Medical History:  Diagnosis Date  . Acid reflux   . Atypical chest pain   . Family history of adverse reaction to anesthesia    MOM- PONV  . GERD (gastroesophageal reflux disease) 10/03/2017  . Hypertension   . Premature atrial contractions   . PVC's (premature ventricular contractions)   . Sinus bradycardia     Patient Active Problem List   Diagnosis Date Noted  . Hypertension 12/05/2019  . Chronic cholecystitis   . GERD (gastroesophageal reflux disease) 10/03/2017  . Gastroesophageal reflux disease without esophagitis 10/03/2017    Past Surgical History:  Procedure  Laterality Date  . CHOLECYSTECTOMY N/A 11/07/2017   Procedure: LAPAROSCOPIC CHOLECYSTECTOMY;  Surgeon: Aviva Signs, MD;  Location: [facility] ORS;  Service: General;  Laterality: N/A;  . COLONOSCOPY    . ESOPHAGOGASTRODUODENOSCOPY N/A 10/07/2017   Procedure: ESOPHAGOGASTRODUODENOSCOPY (EGD);  Surgeon: Rogene Houston, MD;  Location: [facility] ENDO SUITE;  Service: Endoscopy;  Laterality: N/A;  12:00pm  . GALLBLADDER SURGERY  11/07/2017  . NO PAST SURGERIES         Family History  Problem Relation Age of Onset  . Arrhythmia Mother        h/o DCCV  . Leukemia Mother   . Hypertension Mother   . Cataracts Mother   . Peripheral Artery Disease Mother   . Kidney failure Mother   . Heart attack Father 6  . Kidney failure Brother        on dialysis  . Hypertension Brother   . Arrhythmia Maternal Grandmother   . Heart attack Maternal Grandmother   . Stroke Maternal Grandfather   . Peripheral Artery Disease Maternal Grandfather        double amputee  . Dementia Paternal Grandmother   . Diabetes Brother   . Obesity Brother     Social History   Tobacco Use  . Smoking status: Former Smoker    Types: Cigars    Quit date: 11/05/2007    Years since quitting: 12.3  . Smokeless tobacco: Former Systems developer    Types: Snuff    Quit date: 11/05/2015  .  Tobacco comment: dips snuff for 25 years.cigar once or twicea year  Substance Use Topics  . Alcohol use: No  . Drug use: No    Home Medications Prior to Admission medications   Medication Sig Start Date End Date Taking? Authorizing Provider  cetirizine (ZYRTEC) 10 MG tablet Take 10 mg by mouth daily.   Yes [provider]  cyclobenzaprine (FLEXERIL) 10 MG tablet Take 1 tablet (10 mg total) by mouth 3 (three) times daily as needed for muscle spasms. 03/27/18  Yes Pollina, Gwenyth Allegra, MD  HYDROcodone-acetaminophen (NORCO/VICODIN) 5-325 MG tablet Take 1 tablet by mouth every 6 (six) hours as needed for moderate pain. 11/07/17  Yes Aviva Signs,  MD  losartan (COZAAR) 100 MG tablet Take 100 mg by mouth daily.  06/12/19  Yes [provider]  Multiple Vitamin (MULTIVITAMIN) tablet Take 1 tablet by mouth daily.   Yes [provider]  metoprolol tartrate (LOPRESSOR) 25 MG tablet Take 0.5 tablets (12.5 mg total) by mouth 2 (two) times daily as needed (palpitations). 03/19/20   Sakeena Teall, Almyra Free, PA-C  ondansetron (ZOFRAN ODT) 4 MG disintegrating tablet Take 1 tablet (4 mg total) by mouth every 8 (eight) hours as needed for nausea or vomiting. Patient not taking: Reported on 03/19/2020 12/05/19   Cleon Gustin, MD  tamsulosin (FLOMAX) 0.4 MG CAPS capsule Take 1 capsule (0.4 mg total) by mouth daily. Patient not taking: Reported on 03/19/2020 12/05/19   Cleon Gustin, MD    Allergies    Clonidine derivatives, Lisinopril, Protonix [pantoprazole sodium], and Iodine  Review of Systems   Review of Systems  Constitutional: Negative for chills, diaphoresis and fever.  HENT: Negative.   Eyes: Negative.   Respiratory: Negative for chest tightness and shortness of breath.   Cardiovascular: Positive for chest pain and palpitations.  Gastrointestinal: Negative for abdominal pain, nausea and vomiting.  Genitourinary: Negative.   Musculoskeletal: Negative.   Skin: Negative.  Negative for rash and wound.  Neurological: Negative for dizziness and headaches.  Psychiatric/Behavioral: Negative.     Physical Exam Updated Vital Signs BP 140/67   Pulse (!) 47   Temp 98 F (36.7 C) (Oral)   Resp 15   Ht 5\' 9"  (1.753 m)   Wt 105.2 kg   SpO2 100%   BMI 34.26 kg/m   Physical Exam Vitals and nursing note reviewed.  Constitutional:      Appearance: He is well-developed.  HENT:     Head: Normocephalic and atraumatic.  Eyes:     Conjunctiva/sclera: Conjunctivae normal.  Cardiovascular:     Rate and Rhythm: Normal rate and regular rhythm.     Pulses: Normal pulses.     Heart sounds: Normal heart sounds. No murmur.    Pulmonary:     Effort: Pulmonary effort is normal.     Breath sounds: Normal breath sounds. No wheezing.  Abdominal:     General: Bowel sounds are normal.     Palpations: Abdomen is soft.     Tenderness: There is no abdominal tenderness.  Musculoskeletal:        General: Normal range of motion.     Cervical back: Normal range of motion.     Right lower leg: No edema.     Left lower leg: No edema.  Skin:    General: Skin is warm and dry.  Neurological:     Mental Status: He is alert.     ED Results / Procedures / Treatments   Labs (all labs ordered  are listed, but only abnormal results are displayed) Labs Reviewed  COMPREHENSIVE METABOLIC PANEL - Abnormal; Notable for the following components:      Result Value   Glucose, Bld 103 (*)    All other components within normal limits  CBC WITH DIFFERENTIAL/PLATELET  MAGNESIUM  CK  TSH  TROPONIN I (HIGH SENSITIVITY)  TROPONIN I (HIGH SENSITIVITY)    EKG EKG Interpretation  Date/Time:  Wednesday March 19 2020 10:21:20 EDT Ventricular Rate:  63 PR Interval:    QRS Duration: 112 QT Interval:  398 QTC Calculation: 408 R Axis:   -45 Text Interpretation: Sinus rhythm LAD, consider left anterior fascicular block Low voltage, precordial leads Baseline wander in lead(s) II III aVF Confirmed by Elnora Morrison [phone]) on 03/19/2020 2:52:09 PM   Radiology DG Chest 2 View  Result Date: 03/19/2020 CLINICAL DATA:  Chest pain EXAM: CHEST - 2 VIEW COMPARISON:  05/26/2018 FINDINGS: The heart size and mediastinal contours are within normal limits. Both lungs are clear. The visualized skeletal structures are unremarkable. IMPRESSION: Negative. Electronically Signed   By: Rolm Baptise M.D.   On: 03/19/2020 11:17    Procedures Procedures (including critical care time)  Medications Ordered in ED Medications - No data to display  ED Course  I have reviewed the triage vital signs and the nursing notes.  Pertinent labs & imaging results  that were available during my care of the patient were reviewed by me and considered in my medical decision making (see chart for details).    MDM Rules/Calculators/A&P     CHA2DS2-VASc Score: 1                 ASSESSMENT AND PLAN:      Patient with a transient episode of atrial fibrillation which is now resolved.  His labs were reviewed, EKG and chest x-ray ,  discussed with patient.  He has been symptom-free since his evaluation here.  His chads vas score is 1, delta troponins are negative.  It is unclear if his symptoms have any relationship electrical shock here for yesterday evening.  His total CK is normal as are his electrolytes.  Discussed patient with Dr. Harl Bowie who also reviewed his rhythm strip and confirmed that this was atrial fibrillation.  He will need close office follow-up and his office will call patient with an appointment.  It was recommended that he take metoprolol 12.5 mg twice daily as needed only if he has another episode of tachycardia.  Patient understands and agrees with this plan.  He will plan close follow-up with Dr. Harl Bowie.  He was also advised to return here for any return of symptoms that does not respond appropriately to this medication. Final Clinical Impression(s) / ED Diagnoses Final diagnoses:  Paroxysmal atrial fibrillation ([place])    Rx / DC Orders ED Discharge Orders         Ordered    metoprolol tartrate (LOPRESSOR) 25 MG tablet  2 times daily PRN     03/19/20 1356           Evalee Jefferson, PA-C 03/19/20 1504    Elnora Morrison, MD 03/19/20 [id number]

## 2020-03-19 NOTE — Progress Notes (Signed)
Per Dr. Harl Bowie pt needs 3 week event monitor for afib.

## 2020-03-19 NOTE — Progress Notes (Signed)
Patient discussed with ER staff, presented with palpitaitons. EMS stripped I reviewed showed short episode of afib. He is back in SR currently, self converted. He has chronic issues with bradycardia. CHADS2Vasc score is 1 so does not require anticoag. We will obtain a 2 week event monitor. Start lopressor 12.5mg  PRN palpitations may take up to twice a day. If significant afib burden will need to consider options as far as antiarrhythmics, with his chronic brady he will not tolerate regular dosing of av nodal agents, dose only PRN for now.    Carlyle Dolly MD

## 2020-03-19 NOTE — ED Triage Notes (Signed)
EMS reports pt was eating breakfast and had sudden onset of substernal chest pain and HR was fast.  EMS arrived and reports pt in afib hr 120.  EMS gave 4baby asa and 1 nitro.  Pt converted back to SR rate 64.  Pt denies any cp at this time.

## 2020-03-20 ENCOUNTER — Other Ambulatory Visit: Payer: Self-pay | Admitting: *Deleted

## 2020-03-20 ENCOUNTER — Ambulatory Visit (INDEPENDENT_AMBULATORY_CARE_PROVIDER_SITE_OTHER): Payer: Medicare Other

## 2020-03-20 ENCOUNTER — Ambulatory Visit: Payer: Medicare Other

## 2020-03-20 DIAGNOSIS — I4891 Unspecified atrial fibrillation: Secondary | ICD-10-CM

## 2020-03-21 ENCOUNTER — Other Ambulatory Visit: Payer: Self-pay

## 2020-04-23 NOTE — Progress Notes (Addendum)
Cardiology Office Note    Date:  04/24/2020   ID:  PROCOPIO ROVNER, DOB [birth year], MRN [id number]  PCP:  Celene Squibb, MD  Cardiologist: Carlyle Dolly, MD    Chief Complaint  Patient presents with  . Follow-up    recent Emergency Department Visit    History of Present Illness:    Ronald Conner is a 59 y.o. male with past medical history of HTN, GERD, and bradycardia who presents to the office today for follow-up from a recent Emergency Department visit.   He was last examined by Dr. Harl Bowie in 06/2019 and denied any recurrent dizziness. He had previously had chest pain and Coronary CTA showed a calcium score of 0. He underwent cholecystectomy and reported his episodes of pain had resolved. No further testing was pursued and he was continued on his current medication regimen.   In the interim, he presented to [facility] ED on 03/19/2020 for evaluation of palpitations which started while consuming breakfast. Upon EMS arrival, he was found to be in atrial fibrillation with RVR with heart rate in the 120's. He did self convert back to NSR and did not require further intervention. Labs showed WBC 6.6, Hgb 14.8, platelets 211, Na+ 142, K+ 3.6 and creatinine 0.84. TSH 0.523. Mg 2.1. HS Troponin negative. EKG showed NSR, HR 63 with no acute ST abnormalities. His episode was reviewed with Dr. Harl Bowie and given that he had issues with bradycardia in the past, it was recommended that he only utilize Lopressor 12.5 mg as needed for palpitations and not take regularly. Given his CHA2DS2-VASc Score of 1, he was not started on anticoagulation. A 3-week monitor was recommended to assess for any recurrence. This showed normal sinus rhythm with an average heart rate of 58 bpm and minimum heart rate of 37 bpm in the early AM hours which was felt to occur while patient was sleeping. He did have rare PVC's and an isolated 4 beat run of NSVT but no recurrent atrial fibrillation.  In talking with the patient and  his wife today, he reports overall doing well since his recent ED evaluation. He reports that the day prior to his symptoms he had been working outside for a long period of time and thinks he might have been dehydrated. He was also working on a bug zapper the night before and reports this shocked him but he did not lose consciousness or have other symptoms at that time.  He denies any recurrent palpitations. No recent chest pain, dyspnea on exertion, orthopnea, PND or lower extremity edema.  Past Medical History:  Diagnosis Date  . Acid reflux   . Atypical chest pain   . Family history of adverse reaction to anesthesia    MOM- PONV  . GERD (gastroesophageal reflux disease) 10/03/2017  . Hypertension   . Premature atrial contractions   . PVC's (premature ventricular contractions)   . Sinus bradycardia     Past Surgical History:  Procedure Laterality Date  . CHOLECYSTECTOMY N/A 11/07/2017   Procedure: LAPAROSCOPIC CHOLECYSTECTOMY;  Surgeon: Aviva Signs, MD;  Location: [facility] ORS;  Service: General;  Laterality: N/A;  . COLONOSCOPY    . ESOPHAGOGASTRODUODENOSCOPY N/A 10/07/2017   Procedure: ESOPHAGOGASTRODUODENOSCOPY (EGD);  Surgeon: Rogene Houston, MD;  Location: [facility] ENDO SUITE;  Service: Endoscopy;  Laterality: N/A;  12:00pm  . GALLBLADDER SURGERY  11/07/2017  . NO PAST SURGERIES      Current Medications: Outpatient Medications Prior to Visit  Medication Sig Dispense Refill  .  ALPRAZolam (XANAX) 0.25 MG tablet Take 0.25 mg by mouth 3 (three) times daily as needed for anxiety.    . cetirizine (ZYRTEC) 10 MG tablet Take 10 mg by mouth daily.    . cyclobenzaprine (FLEXERIL) 10 MG tablet Take 1 tablet (10 mg total) by mouth 3 (three) times daily as needed for muscle spasms. 20 tablet 0  . HYDROcodone-acetaminophen (NORCO/VICODIN) 5-325 MG tablet Take 1 tablet by mouth every 6 (six) hours as needed for moderate pain. 25 tablet 0  . Multiple Vitamin (MULTIVITAMIN) tablet Take 1 tablet by  mouth daily.    Marland Kitchen losartan (COZAAR) 100 MG tablet Take 100 mg by mouth daily.     . metoprolol tartrate (LOPRESSOR) 25 MG tablet Take 0.5 tablets (12.5 mg total) by mouth 2 (two) times daily as needed (palpitations). (Patient not taking: Reported on 04/24/2020) 30 tablet 0  . ondansetron (ZOFRAN ODT) 4 MG disintegrating tablet Take 1 tablet (4 mg total) by mouth every 8 (eight) hours as needed for nausea or vomiting. (Patient not taking: Reported on 03/19/2020) 30 tablet 0  . tamsulosin (FLOMAX) 0.4 MG CAPS capsule Take 1 capsule (0.4 mg total) by mouth daily. (Patient not taking: Reported on 03/19/2020) 30 capsule 0   No facility-administered medications prior to visit.     Allergies:   Clonidine derivatives, Lisinopril, Protonix [pantoprazole sodium], and Iodine   Social History   Socioeconomic History  . Marital status: Married    Spouse name: Not on file  . Number of children: Not on file  . Years of education: Not on file  . Highest education level: Not on file  Occupational History  . Not on file  Tobacco Use  . Smoking status: Never Smoker  . Smokeless tobacco: Former Systems developer    Types: Snuff  . Tobacco comment: dips snuff for 25 years.cigar once or twicea year  Substance and Sexual Activity  . Alcohol use: No  . Drug use: No  . Sexual activity: Yes    Birth control/protection: None  Other Topics Concern  . Not on file  Social History Narrative  . Not on file   Social Determinants of Health   Financial Resource Strain:   . Difficulty of Paying Living Expenses:   Food Insecurity:   . Worried About Charity fundraiser in the Last Year:   . Arboriculturist in the Last Year:   Transportation Needs:   . Film/video editor (Medical):   Marland Kitchen Lack of Transportation (Non-Medical):   Physical Activity:   . Days of Exercise per Week:   . Minutes of Exercise per Session:   Stress:   . Feeling of Stress :   Social Connections:   . Frequency of Communication with Friends and  Family:   . Frequency of Social Gatherings with Friends and Family:   . Attends Religious Services:   . Active Member of Clubs or Organizations:   . Attends Archivist Meetings:   Marland Kitchen Marital Status:      Family History:  The patient's family history includes Arrhythmia in his maternal grandmother and mother; Cataracts in his mother; Dementia in his paternal grandmother; Diabetes in his brother; Heart attack in his maternal grandmother; Heart attack (age of onset: 69) in his father; Hypertension in his brother and mother; Kidney failure in his brother and mother; Leukemia in his mother; Obesity in his brother; Peripheral Artery Disease in his maternal grandfather and mother; Stroke in his maternal grandfather.   Review of  Systems:   Please see the history of present illness.     General:  No chills, fever, night sweats or weight changes.  Cardiovascular:  No chest pain, dyspnea on exertion, edema, orthopnea, paroxysmal nocturnal dyspnea. Positive for palpitations (now resolved).  Dermatological: No rash, lesions/masses Respiratory: No cough, dyspnea Urologic: No hematuria, dysuria Abdominal:   No nausea, vomiting, diarrhea, bright red blood per rectum, melena, or hematemesis Neurologic:  No visual changes, wkns, changes in mental status. All other systems reviewed and are otherwise negative except as noted above.   Physical Exam:    VS:  BP 118/84   Pulse 60   Ht 5\' 10"  (1.778 m)   Wt 229 lb (103.9 kg)   SpO2 98%   BMI 32.86 kg/m    General: Well developed, well nourished,male appearing in no acute distress. Head: Normocephalic, atraumatic, sclera non-icteric.  Neck: No carotid bruits. JVD not elevated.  Lungs: Respirations regular and unlabored, without wheezes or rales.  Heart: Regular rate and rhythm. No S3 or S4.  No murmur, no rubs, or gallops appreciated. Abdomen: Soft, non-tender, non-distended. No obvious abdominal masses. Msk:  Strength and tone appear normal  for age. No obvious joint deformities or effusions. Extremities: No clubbing or cyanosis. No lower extremity edema.  Distal pedal pulses are 2+ bilaterally. Neuro: Alert and oriented X 3. Moves all extremities spontaneously. No focal deficits noted. Psych:  Responds to questions appropriately with a normal affect. Skin: No rashes or lesions noted  Wt Readings from Last 3 Encounters:  04/24/20 229 lb (103.9 kg)  03/19/20 232 lb (105.2 kg)  12/05/19 233 lb (105.7 kg)     Studies/Labs Reviewed:   EKG:  EKG is not ordered today.  Recent Labs: 03/19/2020: ALT 21; BUN 11; Creatinine, Ser 0.84; Hemoglobin 14.8; Magnesium 2.1; Platelets 211; Potassium 3.6; Sodium 142; TSH 0.523   Lipid Panel No results found for: CHOL, TRIG, HDL, CHOLHDL, VLDL, LDLCALC, LDLDIRECT  Additional studies/ records that were reviewed today include:   Echocardiogram: 05/2018 Study Conclusions   - Left ventricle: The cavity size was normal. Wall thickness was  normal. The estimated ejection fraction was 55%. Wall motion was  normal; there were no regional wall motion abnormalities. Left  ventricular diastolic function parameters were normal for the  patient&'s age.  - Aortic valve: Mildly calcified annulus. Trileaflet.  - Mitral valve: There was mild regurgitation.  - Left atrium: The atrium was mildly dilated.  - Right atrium: The atrium was moderately dilated.  - Tricuspid valve: There was trivial regurgitation.  - Pulmonary arteries: Systolic pressure could not be accurately  estimated.  - Pericardium, extracardiac: There was no pericardial effusion.   Coronary CT: 09/2018 FINDINGS: Non-cardiac: See separate report from [facility] Rad[facility].  Pulmonary veins drained normally to the left atrium.  Calcium Score: 0 Agatston units.  Coronary Arteries: Right dominant with no anomalies  LM: No plaque or stenosis.  LAD system:  No plaque or stenosis.  Circumflex system: No plaque or  stenosis.  RCA system: No plaque or stenosis.  IMPRESSION: 1. Coronary artery calcium score 0 Agatston units, suggesting low risk for future cardiac events.  2.  No significant coronary disease noted   Event Monitor: 02/2020  21 day event monitor  Min HR 37, Max HR 135, Avg HR 58. Min HR in AM hours, potentially while sleeping.  Telemetry tracings show sinus rhythm, rare PVCs. Isolated 4 beat run of NSVT.  Assessment:    1. PAF (paroxysmal atrial fibrillation) ([place])  2. Bradycardia   3. Essential hypertension   4. Ascending aorta enlargement (H[facility])      Plan:   In order of problems listed above:  1. Paroxysmal Atrial Fibrillation - He did have a brief episode of atrial fibrillation in 02/2020 but converted back to normal sinus rhythm without intervention and maintained normal sinus rhythm while in the ED. Recent event monitor showed no recurrence. Electrolytes were within a normal range and thyroid function was normal. He does not consume alcohol and we reviewed monitoring caffeine intake. - His CHA2DS2-VASc Score is 1, therefore he has not required anticoagulation. He does have a prescription for PRN Lopressor and we reviewed scenarios in which to utilize this.  2. Bradycardia - Recent event monitor did show nocturnal bradycardia with an overall average heart rate of 58 bpm while wearing the monitor but no significant high-grade AV block or pauses. Continue to avoid AV nodal blocking agents. He does have a prescription for as needed Lopressor but has not had to utilize this.  3. HTN - BP is well controlled at 118/84 during today's visit. Continue current medication regimen with Losartan 100 mg daily. He does report some fatigue after taking this in the morning and I encouraged him to take the medication at night. If still intolerant, will divide out to 50 mg twice daily.  4. Enlargement of Ascending Aorta - CTA in 11/2019 showed an ectatic ascending aorta measuring 4 cm  with annual imaging recommended. Will need to be obtained next year.    Medication Adjustments/Labs and Tests Ordered: Current medicines are reviewed at length with the patient today.  Concerns regarding medicines are outlined above.  Medication changes, Labs and Tests ordered today are listed in the Patient Instructions below. Patient Instructions  Medication Instructions:  Take losartan at bedtime.  All other medications stay the same.  *If you need a refill on your cardiac medications before your next appointment, please call your pharmacy*   Lab Work: None today If you have labs (blood work) drawn today and your tests are completely normal, you will receive your results only by: Marland Kitchen MyChart Message (if you have MyChart) OR . A paper copy in the mail If you have any lab test that is abnormal or we need to change your treatment, we will call you to review the results.   Testing/Procedures: None today  Follow-Up: At [facility], you and your health needs are our priority.  As part of our continuing mission to provide you with exceptional heart care, we have created designated Provider Care Teams.  These Care Teams include your primary Cardiologist (physician) and Advanced Practice Providers (APPs -  Physician Assistants and Nurse Practitioners) who all work together to provide you with the care you need, when you need it.  We recommend signing up for the patient portal called "MyChart".  Sign up information is provided on this After Visit Summary.  MyChart is used to connect with patients for Virtual Visits (Telemedicine).  Patients are able to view lab/test results, encounter notes, upcoming appointments, etc.  Non-urgent messages can be sent to your provider as well.   To learn more about what you can do with MyChart, go to [email or web address].    Your next appointment:   12 month(s)  The format for your next appointment:   In Person  Provider:   Carlyle Dolly,  MD   Other Instructions None   Thank you for choosing [facility] !  Signed, Erma Heritage, PA-C  04/24/2020 9:17 PM    [place] [address] [place], [place] [address] Phone: [phone] Fax: [phone]

## 2020-04-24 ENCOUNTER — Encounter: Payer: Self-pay | Admitting: Student

## 2020-04-24 ENCOUNTER — Other Ambulatory Visit: Payer: Self-pay

## 2020-04-24 ENCOUNTER — Ambulatory Visit (INDEPENDENT_AMBULATORY_CARE_PROVIDER_SITE_OTHER): Payer: Medicare Other | Admitting: Student

## 2020-04-24 VITALS — BP 118/84 | HR 60 | Ht 70.0 in | Wt 229.0 lb

## 2020-04-24 DIAGNOSIS — R001 Bradycardia, unspecified: Secondary | ICD-10-CM | POA: Diagnosis not present

## 2020-04-24 DIAGNOSIS — I7789 Other specified disorders of arteries and arterioles: Secondary | ICD-10-CM

## 2020-04-24 DIAGNOSIS — I480 Paroxysmal atrial fibrillation: Secondary | ICD-10-CM | POA: Diagnosis not present

## 2020-04-24 DIAGNOSIS — I10 Essential (primary) hypertension: Secondary | ICD-10-CM | POA: Diagnosis not present

## 2020-04-24 MED ORDER — LOSARTAN POTASSIUM 100 MG PO TABS: 100.0000 mg | ORAL_TABLET | Freq: Every day | ORAL | 3 refills | Status: AC

## 2020-04-24 NOTE — Patient Instructions (Signed)
Medication Instructions:  Take losartan at bedtime.  All other medications stay the same.  *If you need a refill on your cardiac medications before your next appointment, please call your pharmacy*   Lab Work: None today If you have labs (blood work) drawn today and your tests are completely normal, you will receive your results only by:  [place] (if you have MyChart) OR  A paper copy in the mail If you have any lab test that is abnormal or we need to change your treatment, we will call you to review the results.   Testing/Procedures: None today    Follow-Up: At [facility], you and your health needs are our priority.  As part of our continuing mission to provide you with exceptional heart care, we have created designated Provider Care Teams.  These Care Teams include your primary Cardiologist (physician) and Advanced Practice Providers (APPs -  Physician Assistants and Nurse Practitioners) who all work together to provide you with the care you need, when you need it.  We recommend signing up for the patient portal called "MyChart".  Sign up information is provided on this After Visit Summary.  MyChart is used to connect with patients for Virtual Visits (Telemedicine).  Patients are able to view lab/test results, encounter notes, upcoming appointments, etc.  Non-urgent messages can be sent to your provider as well.   To learn more about what you can do with MyChart, go to [email or web address].    Your next appointment:   12 month(s)  The format for your next appointment:   In Person  Provider:   Carlyle Dolly, MD   Other Instructions None      Thank you for choosing [facility] !

## 2020-05-22 ENCOUNTER — Ambulatory Visit: Payer: Medicare Other | Admitting: Student

## 2020-11-19 DIAGNOSIS — I10 Essential (primary) hypertension: Secondary | ICD-10-CM | POA: Diagnosis not present

## 2020-11-19 DIAGNOSIS — K219 Gastro-esophageal reflux disease without esophagitis: Secondary | ICD-10-CM | POA: Diagnosis not present

## 2020-11-19 DIAGNOSIS — I480 Paroxysmal atrial fibrillation: Secondary | ICD-10-CM | POA: Diagnosis not present

## 2020-11-19 DIAGNOSIS — M542 Cervicalgia: Secondary | ICD-10-CM | POA: Diagnosis not present

## 2020-11-19 DIAGNOSIS — E669 Obesity, unspecified: Secondary | ICD-10-CM | POA: Diagnosis not present

## 2020-11-19 DIAGNOSIS — Z6833 Body mass index (BMI) 33.0-33.9, adult: Secondary | ICD-10-CM | POA: Diagnosis not present

## 2020-12-09 ENCOUNTER — Ambulatory Visit: Payer: Self-pay | Admitting: *Deleted

## 2020-12-09 NOTE — Telephone Encounter (Signed)
Wife, Ronald Conner calling in but he is in the background. The agent scheduled him for a covid test on Thur.    I went over the care advice for fever and for both of them to quarantine until the covid test result comes back in case he is positive for covid.  I let him know Mucinex DM is good for the congestion.  Wife thanked me for my help.   I let them know to seek medical attention if he develops shortness of breath or chest pain or his symptoms become worse.  She verbalized understanding.   Reason for Disposition . [1] Fever AND [2] no signs of serious infection or localizing symptoms (all other triage questions negative)    Has covid test scheduled for Thursday.  Answer Assessment - Initial Assessment Questions 1. TEMPERATURE: "What is the most recent temperature?"  "How was it measured?"      Ronald Conner calling in.   Husband's fever 103.  He took some Tylenol brought it down to 99. Congestion in chest and sore throat. 2. ONSET: "When did the fever start?"      Last night 3. SYMPTOMS: "Do you have any other symptoms besides the fever?"  (e.g., colds, headache, sore throat, earache, cough, rash, diarrhea, vomiting, abdominal pain)     Chest congestion and a sore throat. 4. CAUSE: If there are no symptoms, ask: "What do you think is causing the fever?"      Covid.   He is set up for a test on Thursday. 5. CONTACTS: "Does anyone else in the family have an infection?"     No 6. TREATMENT: "What have you done so far to treat this fever?" (e.g., medications)     Tylenol which helped. 7. IMMUNOCOMPROMISE: "Do you have of the following: diabetes, HIV positive, splenectomy, cancer chemotherapy, chronic steroid treatment, transplant patient, etc."     Not asked 8. PREGNANCY: "Is there any chance you are pregnant?" "When was your last menstrual period?"     N/A 9. TRAVEL: "Have you traveled out of the country in the last month?" (e.g., travel history, exposures)     No  Protocols used:  FEVER-A-AH

## 2020-12-11 ENCOUNTER — Encounter: Payer: Self-pay | Admitting: Emergency Medicine

## 2020-12-11 ENCOUNTER — Other Ambulatory Visit: Payer: Self-pay

## 2020-12-11 ENCOUNTER — Other Ambulatory Visit: Payer: Medicare Other

## 2020-12-11 ENCOUNTER — Ambulatory Visit
Admission: EM | Admit: 2020-12-11 | Discharge: 2020-12-11 | Disposition: A | Discharge status: Home / Self Care | Payer: Medicare Other | Attending: Emergency Medicine | Admitting: Emergency Medicine

## 2020-12-11 DIAGNOSIS — Z1152 Encounter for screening for COVID-19: Secondary | ICD-10-CM

## 2020-12-11 DIAGNOSIS — J029 Acute pharyngitis, unspecified: Secondary | ICD-10-CM

## 2020-12-11 DIAGNOSIS — J069 Acute upper respiratory infection, unspecified: Secondary | ICD-10-CM

## 2020-12-11 MED ORDER — LIDOCAINE VISCOUS HCL 2 % MT SOLN
15.0000 mL | OROMUCOSAL | 1 refills | Status: DC | PRN
Start: 2020-12-11 — End: 2022-03-19

## 2020-12-11 MED ORDER — DEXAMETHASONE 4 MG PO TABS: 4.0000 mg | ORAL_TABLET | Freq: Every day | ORAL | 0 refills | Status: AC

## 2020-12-11 MED ORDER — BENZONATATE 100 MG PO CAPS
100.0000 mg | ORAL_CAPSULE | Freq: Three times a day (TID) | ORAL | 0 refills | Status: DC | PRN
Start: 2020-12-11 — End: 2022-03-19

## 2020-12-11 MED ORDER — CETIRIZINE HCL 10 MG PO TABS: 10.0000 mg | ORAL_TABLET | Freq: Every day | ORAL | 0 refills | Status: AC

## 2020-12-11 NOTE — Discharge Instructions (Signed)
COVID testing ordered.  It will take between 2-7 days for test results.  Someone will contact you regarding abnormal results.    Tessalon Perles prescribed for cough Zyrtec for nasal congestion, runny nose, and/or sore throat Lidocaine mouthwash was prescribed for sore throat.  Please make sure your food is cold before eating. Decadron prescribed Use medications daily for symptom relief Use OTC medications like ibuprofen or tylenol as needed fever or pain Call or go to the ED if you have any new or worsening symptoms such as fever, worsening cough, shortness of breath, chest tightness, chest pain, turning blue, changes in mental status, etc..Marland Kitchen

## 2020-12-11 NOTE — ED Triage Notes (Signed)
patient has fever, sore throat, cough congestion, body aches x 5 days covid and flu test

## 2020-12-11 NOTE — ED Provider Notes (Signed)
[facility]   [id number] 12/11/20 Arrival Time: 0917   CC: COVID symptoms  SUBJECTIVE: History from: patient and family.  Ronald Conner is a 60 y.o. male who presented to the urgent care with a complaint of fever, cough, nasal congestion, sore throat and body aches for the past 5 days.  Denies sick exposure to COVID, flu or strep.  Denies recent travel.  Has tried OTC medication without relief.  Denies alleviating or aggravating factors.  Denies previous symptoms in the past.   Denies fever, chills, fatigue, sinus pain, rhinorrhea, SOB, wheezing, chest pain, nausea, changes in bowel or bladder habits.     ROS: As per HPI.  All other pertinent ROS negative.      Past Medical History:  Diagnosis Date  . Acid reflux   . Atypical chest pain   . Family history of adverse reaction to anesthesia    MOM- PONV  . GERD (gastroesophageal reflux disease) 10/03/2017  . Hypertension   . Premature atrial contractions   . PVC's (premature ventricular contractions)   . Sinus bradycardia    Past Surgical History:  Procedure Laterality Date  . CHOLECYSTECTOMY N/A 11/07/2017   Procedure: LAPAROSCOPIC CHOLECYSTECTOMY;  Surgeon: Aviva Signs, MD;  Location: [facility] ORS;  Service: General;  Laterality: N/A;  . COLONOSCOPY    . ESOPHAGOGASTRODUODENOSCOPY N/A 10/07/2017   Procedure: ESOPHAGOGASTRODUODENOSCOPY (EGD);  Surgeon: Rogene Houston, MD;  Location: [facility] ENDO SUITE;  Service: Endoscopy;  Laterality: N/A;  12:00pm  . GALLBLADDER SURGERY  11/07/2017  . NO PAST SURGERIES     Allergies  Allergen Reactions  . Clonidine Derivatives Other (See Comments)    Constipation-  . Lisinopril Other (See Comments) and Cough    Severe back pain  . Protonix [Pantoprazole Sodium] Other (See Comments)    Lightheaded, head aches  . Iodine Rash   No current facility-administered medications on file prior to encounter.   Current Outpatient Medications on File Prior to Encounter  Medication Sig  Dispense Refill  . ALPRAZolam (XANAX) 0.25 MG tablet Take 0.25 mg by mouth 3 (three) times daily as needed for anxiety.    . cyclobenzaprine (FLEXERIL) 10 MG tablet Take 1 tablet (10 mg total) by mouth 3 (three) times daily as needed for muscle spasms. 20 tablet 0  . HYDROcodone-acetaminophen (NORCO/VICODIN) 5-325 MG tablet Take 1 tablet by mouth every 6 (six) hours as needed for moderate pain. 25 tablet 0  . losartan (COZAAR) 100 MG tablet Take 1 tablet (100 mg total) by mouth at bedtime. 90 tablet 3  . metoprolol tartrate (LOPRESSOR) 25 MG tablet Take 0.5 tablets (12.5 mg total) by mouth 2 (two) times daily as needed (palpitations). (Patient not taking: Reported on 04/24/2020) 30 tablet 0  . Multiple Vitamin (MULTIVITAMIN) tablet Take 1 tablet by mouth daily.     Social History   Socioeconomic History  . Marital status: Married    Spouse name: Not on file  . Number of children: Not on file  . Years of education: Not on file  . Highest education level: Not on file  Occupational History  . Not on file  Tobacco Use  . Smoking status: Never Smoker  . Smokeless tobacco: Former Systems developer    Types: Snuff  . Tobacco comment: dips snuff for 25 years.cigar once or twicea year  Vaping Use  . Vaping Use: Never used  Substance and Sexual Activity  . Alcohol use: No  . Drug use: No  . Sexual activity: Yes  Birth control/protection: None  Other Topics Concern  . Not on file  Social History Narrative  . Not on file   Social Determinants of Health   Financial Resource Strain: Not on file  Food Insecurity: Not on file  Transportation Needs: Not on file  Physical Activity: Not on file  Stress: Not on file  Social Connections: Not on file  Intimate Partner Violence: Not on file   Family History  Problem Relation Age of Onset  . Arrhythmia Mother        h/o DCCV  . Leukemia Mother   . Hypertension Mother   . Cataracts Mother   . Peripheral Artery Disease Mother   . Kidney failure Mother    . Heart attack Father 11  . Kidney failure Brother        on dialysis  . Hypertension Brother   . Arrhythmia Maternal Grandmother   . Heart attack Maternal Grandmother   . Stroke Maternal Grandfather   . Peripheral Artery Disease Maternal Grandfather        double amputee  . Dementia Paternal Grandmother   . Diabetes Brother   . Obesity Brother     OBJECTIVE:  Vitals:   12/11/20 0956  BP: 130/80  Pulse: 63  Resp: 15  Temp: 98.6 F (37 C)  SpO2: 97%     General appearance: alert; appears fatigued, but nontoxic; speaking in full sentences and tolerating own secretions HEENT: NCAT; Ears: EACs clear, TMs pearly gray; Eyes: PERRL.  EOM grossly intact. Sinuses: nontender; Nose: nares patent without rhinorrhea, Throat: oropharynx clear, tonsils non erythematous or enlarged, uvula midline  Neck: supple without LAD Lungs: unlabored respirations, symmetrical air entry; cough: moderate; no respiratory distress; CTAB Heart: regular rate and rhythm.  Radial pulses 2+ symmetrical bilaterally Skin: warm and dry Psychological: alert and cooperative; normal mood and affect  LABS:  No results found for this or any previous visit (from the past 24 hour(s)).   ASSESSMENT & PLAN:  1. Encounter for screening for COVID-19   2. Sore throat   3. URI with cough and congestion     Meds ordered this encounter  Medications  . benzonatate (TESSALON) 100 MG capsule    Sig: Take 1 capsule (100 mg total) by mouth 3 (three) times daily as needed for cough.    Dispense:  30 capsule    Refill:  0  . cetirizine (ZYRTEC ALLERGY) 10 MG tablet    Sig: Take 1 tablet (10 mg total) by mouth daily.    Dispense:  30 tablet    Refill:  0  . dexamethasone (DECADRON) 4 MG tablet    Sig: Take 1 tablet (4 mg total) by mouth daily for 7 days.    Dispense:  7 tablet    Refill:  0  . lidocaine (XYLOCAINE) 2 % solution    Sig: Use as directed 15 mLs in the mouth or throat as needed for mouth pain.     Dispense:  100 mL    Refill:  1    Discharge Instructions  COVID testing ordered.  It will take between 2-7 days for test results.  Someone will contact you regarding abnormal results.    Tessalon Perles prescribed for cough Zyrtec for nasal congestion, runny nose, and/or sore throat Lidocaine mouthwash was prescribed for sore throat.  Please make sure your food is cold before eating. Decadron prescribed Use medications daily for symptom relief Use OTC medications like ibuprofen or tylenol as needed fever or pain  Call or go to the ED if you have any new or worsening symptoms such as fever, worsening cough, shortness of breath, chest tightness, chest pain, turning blue, changes in mental status, etc...   Reviewed expectations re: course of current medical issues. Questions answered. Outlined signs and symptoms indicating need for more acute intervention. Patient verbalized understanding. After Visit Summary given.         Emerson Monte, FNP 12/11/20 1038

## 2020-12-13 LAB — COVID-19, FLU A+B NAA
Influenza A, NAA: NOT DETECTED
Influenza B, NAA: NOT DETECTED
SARS-CoV-2, NAA: DETECTED — AB

## 2020-12-20 DIAGNOSIS — I959 Hypotension, unspecified: Secondary | ICD-10-CM | POA: Diagnosis not present

## 2020-12-20 DIAGNOSIS — Z743 Need for continuous supervision: Secondary | ICD-10-CM | POA: Diagnosis not present

## 2020-12-20 DIAGNOSIS — R001 Bradycardia, unspecified: Secondary | ICD-10-CM | POA: Diagnosis not present

## 2020-12-20 DIAGNOSIS — R231 Pallor: Secondary | ICD-10-CM | POA: Diagnosis not present

## 2020-12-20 DIAGNOSIS — U071 COVID-19: Secondary | ICD-10-CM | POA: Diagnosis not present

## 2021-04-03 DIAGNOSIS — J019 Acute sinusitis, unspecified: Secondary | ICD-10-CM | POA: Diagnosis not present

## 2021-04-03 DIAGNOSIS — M542 Cervicalgia: Secondary | ICD-10-CM | POA: Diagnosis not present

## 2021-05-04 DIAGNOSIS — I10 Essential (primary) hypertension: Secondary | ICD-10-CM | POA: Diagnosis not present

## 2021-05-07 DIAGNOSIS — T300 Burn of unspecified body region, unspecified degree: Secondary | ICD-10-CM | POA: Diagnosis not present

## 2021-05-07 DIAGNOSIS — E781 Pure hyperglyceridemia: Secondary | ICD-10-CM | POA: Diagnosis not present

## 2021-05-07 DIAGNOSIS — K219 Gastro-esophageal reflux disease without esophagitis: Secondary | ICD-10-CM | POA: Diagnosis not present

## 2021-05-07 DIAGNOSIS — G894 Chronic pain syndrome: Secondary | ICD-10-CM | POA: Diagnosis not present

## 2021-05-07 DIAGNOSIS — I10 Essential (primary) hypertension: Secondary | ICD-10-CM | POA: Diagnosis not present

## 2021-05-07 DIAGNOSIS — F419 Anxiety disorder, unspecified: Secondary | ICD-10-CM | POA: Diagnosis not present

## 2021-06-12 DIAGNOSIS — K219 Gastro-esophageal reflux disease without esophagitis: Secondary | ICD-10-CM | POA: Diagnosis not present

## 2021-06-12 DIAGNOSIS — R109 Unspecified abdominal pain: Secondary | ICD-10-CM | POA: Diagnosis not present

## 2021-06-12 DIAGNOSIS — R110 Nausea: Secondary | ICD-10-CM | POA: Diagnosis not present

## 2021-06-12 DIAGNOSIS — R197 Diarrhea, unspecified: Secondary | ICD-10-CM | POA: Diagnosis not present

## 2021-11-06 DIAGNOSIS — Z23 Encounter for immunization: Secondary | ICD-10-CM | POA: Diagnosis not present

## 2021-11-06 DIAGNOSIS — G894 Chronic pain syndrome: Secondary | ICD-10-CM | POA: Diagnosis not present

## 2021-11-06 DIAGNOSIS — R5383 Other fatigue: Secondary | ICD-10-CM | POA: Diagnosis not present

## 2021-11-06 DIAGNOSIS — E669 Obesity, unspecified: Secondary | ICD-10-CM | POA: Diagnosis not present

## 2021-11-06 DIAGNOSIS — I10 Essential (primary) hypertension: Secondary | ICD-10-CM | POA: Diagnosis not present

## 2021-11-06 DIAGNOSIS — K219 Gastro-esophageal reflux disease without esophagitis: Secondary | ICD-10-CM | POA: Diagnosis not present

## 2021-11-06 DIAGNOSIS — E781 Pure hyperglyceridemia: Secondary | ICD-10-CM | POA: Diagnosis not present

## 2021-11-06 DIAGNOSIS — Z0001 Encounter for general adult medical examination with abnormal findings: Secondary | ICD-10-CM | POA: Diagnosis not present

## 2021-11-06 DIAGNOSIS — R7301 Impaired fasting glucose: Secondary | ICD-10-CM | POA: Diagnosis not present

## 2021-11-06 DIAGNOSIS — F419 Anxiety disorder, unspecified: Secondary | ICD-10-CM | POA: Diagnosis not present

## 2022-02-05 ENCOUNTER — Emergency Department (HOSPITAL_COMMUNITY)
Admission: EM | Admit: 2022-02-05 | Discharge: 2022-02-06 | Disposition: A | Discharge status: Home / Self Care | Payer: Medicare Other | Attending: Emergency Medicine | Admitting: Emergency Medicine

## 2022-02-05 ENCOUNTER — Other Ambulatory Visit: Payer: Self-pay

## 2022-02-05 DIAGNOSIS — R059 Cough, unspecified: Secondary | ICD-10-CM | POA: Diagnosis not present

## 2022-02-05 DIAGNOSIS — Z79899 Other long term (current) drug therapy: Secondary | ICD-10-CM | POA: Insufficient documentation

## 2022-02-05 DIAGNOSIS — R000 Tachycardia, unspecified: Secondary | ICD-10-CM | POA: Diagnosis not present

## 2022-02-05 DIAGNOSIS — I10 Essential (primary) hypertension: Secondary | ICD-10-CM | POA: Diagnosis not present

## 2022-02-05 DIAGNOSIS — I480 Paroxysmal atrial fibrillation: Secondary | ICD-10-CM | POA: Insufficient documentation

## 2022-02-05 DIAGNOSIS — R069 Unspecified abnormalities of breathing: Secondary | ICD-10-CM | POA: Diagnosis not present
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
## 2022-02-06 ENCOUNTER — Encounter (HOSPITAL_COMMUNITY): Payer: Self-pay

## 2022-02-06 LAB — CBC
HCT: 51.1 % (ref 39.0–52.0)
Hemoglobin: 17.3 g/dL — ABNORMAL HIGH (ref 13.0–17.0)
MCH: 30.2 pg (ref 26.0–34.0)
MCHC: 33.9 g/dL (ref 30.0–36.0)
MCV: 89.2 fL (ref 80.0–100.0)
Platelets: 245 10*3/uL (ref 150–400)
RBC: 5.73 MIL/uL (ref 4.22–5.81)
RDW: 12.8 % (ref 11.5–15.5)
WBC: 7 10*3/uL (ref 4.0–10.5)
nRBC: 0 % (ref 0.0–0.2)

## 2022-02-06 LAB — BASIC METABOLIC PANEL
Anion gap: 10 (ref 5–15)
BUN: 9 mg/dL (ref 8–23)
CO2: 27 mmol/L (ref 22–32)
Calcium: 9.3 mg/dL (ref 8.9–10.3)
Chloride: 104 mmol/L (ref 98–111)
Creatinine, Ser: 0.88 mg/dL (ref 0.61–1.24)
GFR, Estimated: >60 mL/min (ref >60)
Glucose, Bld: 106 mg/dL — ABNORMAL HIGH (ref 70–99)
Potassium: 3.5 mmol/L (ref 3.5–5.1)
Sodium: 141 mmol/L (ref 135–145)

## 2022-02-06 LAB — MAGNESIUM: Magnesium: 2.1 mg/dL (ref 1.7–2.4)

## 2022-02-06 MED ORDER — APIXABAN 5 MG PO TABS: 5.0000 mg | ORAL_TABLET | Freq: Two times a day (BID) | ORAL | 0 refills | Status: DC

## 2022-02-06 MED ORDER — APIXABAN 5 MG PO TABS
5.0000 mg | ORAL_TABLET | Freq: Two times a day (BID) | ORAL | 0 refills | Status: DC
Start: 2022-02-06 — End: 2022-03-19

## 2022-02-06 MED ORDER — APIXABAN 5 MG PO TABS
5.0000 mg | ORAL_TABLET | Freq: Two times a day (BID) | ORAL | Status: DC
  Administered 2022-02-06: 5 mg via ORAL
  Filled 2022-02-06: qty 1

## 2022-02-06 NOTE — Discharge Instructions (Signed)
Please follow-up with the atrial fibrillation clinic.  You will need to be on blood thinners for the next month.  Your cardiologist will decide if you need to be on blood thinners beyond that. ?

## 2022-02-06 NOTE — ED Provider Notes (Signed)
?[facility] ?Provider Note ? ? ?CSN: [id number] ?Arrival date & time: 02/05/22  2347 ? ?  ? ?History ? ?Chief Complaint  ?Patient presents with  ? Tachycardia  ? Hypertension  ? ? ?Ronald Conner is a 61 y.o. male. ? ?The history is provided by the patient.  ?Hypertension ?He has history of hypertension, GERD and comes in because of difficulty breathing and rapid heartbeat.  He states that he saw some whitening and he felt like he had a panic attack.  He felt that he was short of breath and when he checked his pulse, his pulse was very fast.  He took an anxiety pill without any benefit.  He denies any chest pain, heaviness, tightness, pressure  Symptoms have resolved after arriving in the emergency department. ?  ?Home Medications ?Prior to Admission medications   ?Medication Sig Start Date End Date Taking? Authorizing Provider  ?ALPRAZolam (XANAX) 0.25 MG tablet Take 0.25 mg by mouth 3 (three) times daily as needed for anxiety.    [provider]  ?benzonatate (TESSALON) 100 MG capsule Take 1 capsule (100 mg total) by mouth 3 (three) times daily as needed for cough. 12/11/20   Emerson Monte, FNP  ?cetirizine (ZYRTEC ALLERGY) 10 MG tablet Take 1 tablet (10 mg total) by mouth daily. 12/11/20   Emerson Monte, FNP  ?cyclobenzaprine (FLEXERIL) 10 MG tablet Take 1 tablet (10 mg total) by mouth 3 (three) times daily as needed for muscle spasms. 03/27/18   Orpah Greek, MD  ?HYDROcodone-acetaminophen (NORCO/VICODIN) 5-325 MG tablet Take 1 tablet by mouth every 6 (six) hours as needed for moderate pain. 11/07/17   Aviva Signs, MD  ?lidocaine (XYLOCAINE) 2 % solution Use as directed 15 mLs in the mouth or throat as needed for mouth pain. 12/11/20   Emerson Monte, FNP  ?losartan (COZAAR) 100 MG tablet Take 1 tablet (100 mg total) by mouth at bedtime. 04/24/20   Strader, Fransisco Hertz, PA-C  ?metoprolol tartrate (LOPRESSOR) 25 MG tablet Take 0.5 tablets (12.5 mg total) by mouth 2  (two) times daily as needed (palpitations). ?Patient not taking: Reported on 04/24/2020 03/19/20   Evalee Jefferson, PA-C  ?Multiple Vitamin (MULTIVITAMIN) tablet Take 1 tablet by mouth daily.    [provider]  ?   ? ?Allergies    ?Clonidine derivatives, Lisinopril, Protonix [pantoprazole sodium], and Iodine   ? ?Review of Systems   ?Review of Systems  ?All other systems reviewed and are negative. ? ?Physical Exam ?Updated Vital Signs ?BP (!) 142/89   Pulse 62   Temp 98.4 ?F (36.9 ?C) (Oral)   Resp (!) 22   Ht '5\' 10"'$  (1.778 m)   Wt 104.3 kg   SpO2 97%   BMI 33.00 kg/m?  ?Physical Exam ?Vitals and nursing note reviewed.  ?61 year old male, resting comfortably and in no acute distress. Vital signs are significant for initial tachycardia and mild elevation of blood pressure and respiratory rate, subsequent normalization of heart rate. Oxygen saturation is 97%, which is normal. ?Head is normocephalic and atraumatic. PERRLA, EOMI. Oropharynx is clear. ?Neck is nontender and supple without adenopathy or JVD. ?Back is nontender and there is no CVA tenderness. ?Lungs are clear without rales, wheezes, or rhonchi. ?Chest is nontender. ?Heart has regular rate and rhythm without murmur. ?Abdomen is soft, flat, nontender. ?Extremities have no cyanosis or edema, full range of motion is present. ?Skin is warm and dry without rash. ?Neurologic: Mental status is normal, cranial  nerves are intact. ? ?ED Results / Procedures / Treatments   ?Labs ?(all labs ordered are listed, but only abnormal results are displayed) ?Labs Reviewed  ?BASIC METABOLIC PANEL - Abnormal; Notable for the following components:  ?    Result Value  ? Glucose, Bld 106 (*)   ? All other components within normal limits  ?CBC - Abnormal; Notable for the following components:  ? Hemoglobin 17.3 (*)   ? All other components within normal limits  ?MAGNESIUM  ? ? ?EKG ?EKG Interpretation ? ?Date/Time:  Friday February 05 2022 23:52:18 EDT ?Ventricular Rate:   140 ?PR Interval:    ?QRS Duration: 96 ?QT Interval:  321 ?QTC Calculation: 509 ?R Axis:   -42 ?Text Interpretation: Atrial fibrillation LAD, consider left anterior fascicular block Repol abnrm suggests ischemia, inferior leads Prolonged QT interval When compared with ECG of 02/2854/2021, Atrial fibrillation with rapid ventricular response has replaced Sinus rhythm QT has lengthened Confirmed by Delora Fuel ([id number]) on 02/06/2022 12:12:59 AM ? ? EKG Interpretation ? ?Date/Time:  Saturday February 06 2022 00:09:43 EDT ?Ventricular Rate:  152 ?PR Interval:    ?QRS Duration: 107 ?QT Interval:  298 ?QTC Calculation: 457 ?R Axis:   -21 ?Text Interpretation: Atrial fibrillation LAD, consider left anterior fascicular block Abnormal R-wave progression, late transition Borderline T abnormalities, lateral leads Artifact in lead(s) I II aVR aVL aVF When compared with ECG of 02/05/2022, QT has shortened Confirmed by Delora Fuel ([id number]) on 02/06/2022 12:16:17 AM ?  ? ?  ? ? EKG Interpretation ? ?Date/Time:  Saturday February 06 2022 00:45:12 EDT ?Ventricular Rate:  59 ?PR Interval:  184 ?QRS Duration: 108 ?QT Interval:  386 ?QTC Calculation: 383 ?R Axis:   -4 ?Text Interpretation: Sinus rhythm Atrial premature complex LAD, consider left anterior fascicular block Consider anterior infarct When compared with ECG of EARLIER SAME DATE Sinus rhythm has replaced Atrial fibrillation with rapid ventricular response Confirmed by Delora Fuel ([id number]) on 02/06/2022 1:01:06 AM ?  ? ?  ? ? ?Procedures ?Procedures  ?Cardiac monitor shows normal sinus rhythm, per my interpretation. ? ?Medications Ordered in ED ?Medications - No data to display ? ?ED Course/ Medical Decision Making/ A&P ?  ?                        ?Medical Decision Making ?Amount and/or Complexity of Data Reviewed ?Labs: ordered. ? ?Risk ?Prescription drug management. ? ? ?Paroxysmal atrial fibrillation.  Initial ECG shows atrial fibrillation with rapid ventricular response.  Before I  could get into the see him, he had converted to sinus rhythm.  Old records are reviewed, showing episode of paroxysmal atrial fibrillation on 03/19/2020, cardiology follow-up noting CHA2DS2-VASc score of 1 opted for no anticoagulation.  We will recheck screening labs.CHA2DS2-VASc score has not changed, but will plan on 30 days of anticoagulation along with referral to atrial fibrillation clinic. ? ?Repeat ECG confirms sinus rhythm.  Labs are unremarkable.  He is discharged with prescriptions for apixaban and referral to atrial fibrillation clinic. ? ? ?CHA2DS2/VAS Stroke Risk Points   ?   N/A >= 2 Points: High Risk  ?1 - 1.99 Points: Medium Risk  ?0 Points: Low Risk  ?  Last Change: N/A   ?  ? This score determines the patient's risk of having a stroke if the  ?patient has atrial fibrillation.  ?   ?The patient's score is 1 (one-point for hypertension).  This score is unchanged from last reported value on  04/24/2020.  ?  ?  ? ? ? ? ?Final Clinical Impression(s) / ED Diagnoses ?Final diagnoses:  ?Paroxysmal atrial fibrillation with rapid ventricular response ([place])  ? ? ?Rx / DC Orders ?ED Discharge Orders   ? ?      Ordered  ?  Amb referral to AFIB Clinic       ? 02/06/22 0024  ?  apixaban (ELIQUIS) 5 MG TABS tablet  2 times daily,   Status:  Discontinued       ? 02/06/22 0102  ?  apixaban (ELIQUIS) 5 MG TABS tablet  2 times daily       ? 02/06/22 0144  ? ?  ?  ? ?  ? ? ?  ?Delora Fuel, MD ?09/11/10 0710 ? ?

## 2022-02-08 DIAGNOSIS — I480 Paroxysmal atrial fibrillation: Secondary | ICD-10-CM | POA: Diagnosis not present

## 2022-02-16 ENCOUNTER — Emergency Department (HOSPITAL_COMMUNITY): Payer: Medicare Other

## 2022-02-16 ENCOUNTER — Encounter (HOSPITAL_COMMUNITY): Payer: Self-pay | Admitting: *Deleted

## 2022-02-16 ENCOUNTER — Encounter (HOSPITAL_COMMUNITY): Payer: Self-pay | Admitting: Nurse Practitioner

## 2022-02-16 ENCOUNTER — Other Ambulatory Visit: Payer: Self-pay

## 2022-02-16 ENCOUNTER — Emergency Department (HOSPITAL_COMMUNITY)
Admission: EM | Admit: 2022-02-16 | Discharge: 2022-02-16 | Disposition: A | Discharge status: Home / Self Care | Payer: Medicare Other | Attending: Student | Admitting: Student

## 2022-02-16 DIAGNOSIS — I10 Essential (primary) hypertension: Secondary | ICD-10-CM | POA: Diagnosis not present

## 2022-02-16 DIAGNOSIS — R002 Palpitations: Secondary | ICD-10-CM | POA: Diagnosis present

## 2022-02-16 DIAGNOSIS — R079 Chest pain, unspecified: Secondary | ICD-10-CM | POA: Diagnosis not present

## 2022-02-16 DIAGNOSIS — I491 Atrial premature depolarization: Secondary | ICD-10-CM | POA: Diagnosis not present

## 2022-02-16 DIAGNOSIS — Z79899 Other long term (current) drug therapy: Secondary | ICD-10-CM | POA: Insufficient documentation

## 2022-02-16 DIAGNOSIS — Z8616 Personal history of COVID-19: Secondary | ICD-10-CM | POA: Insufficient documentation

## 2022-02-16 DIAGNOSIS — Z87891 Personal history of nicotine dependence: Secondary | ICD-10-CM | POA: Diagnosis not present

## 2022-02-16 DIAGNOSIS — Z7901 Long term (current) use of anticoagulants: Secondary | ICD-10-CM | POA: Insufficient documentation

## 2022-02-16 DIAGNOSIS — I4891 Unspecified atrial fibrillation: Secondary | ICD-10-CM | POA: Insufficient documentation

## 2022-02-16 DIAGNOSIS — I499 Cardiac arrhythmia, unspecified: Secondary | ICD-10-CM | POA: Diagnosis not present

## 2022-02-16 DIAGNOSIS — R000 Tachycardia, unspecified: Secondary | ICD-10-CM | POA: Diagnosis not present

## 2022-02-16 DIAGNOSIS — I480 Paroxysmal atrial fibrillation: Secondary | ICD-10-CM | POA: Diagnosis not present

## 2022-02-16 HISTORY — DX: COVID-19: U07.1

## 2022-02-16 HISTORY — DX: Unspecified atrial fibrillation: I48.91

## 2022-02-16 LAB — CBC
HCT: 45.3 % (ref 39.0–52.0)
Hemoglobin: 15.5 g/dL (ref 13.0–17.0)
MCH: 30.5 pg (ref 26.0–34.0)
MCHC: 34.2 g/dL (ref 30.0–36.0)
MCV: 89.2 fL (ref 80.0–100.0)
Platelets: 230 10*3/uL (ref 150–400)
RBC: 5.08 MIL/uL (ref 4.22–5.81)
RDW: 12.7 % (ref 11.5–15.5)
WBC: 7.7 10*3/uL (ref 4.0–10.5)
nRBC: 0 % (ref 0.0–0.2)

## 2022-02-16 LAB — BASIC METABOLIC PANEL
Anion gap: 8 (ref 5–15)
BUN: 11 mg/dL (ref 8–23)
CO2: 23 mmol/L (ref 22–32)
Calcium: 8.7 mg/dL — ABNORMAL LOW (ref 8.9–10.3)
Chloride: 110 mmol/L (ref 98–111)
Creatinine, Ser: 0.91 mg/dL (ref 0.61–1.24)
GFR, Estimated: >60 mL/min (ref >60)
Glucose, Bld: 93 mg/dL (ref 70–99)
Potassium: 3.5 mmol/L (ref 3.5–5.1)
Sodium: 141 mmol/L (ref 135–145)

## 2022-02-16 LAB — TROPONIN I (HIGH SENSITIVITY): Troponin I (High Sensitivity): 6 ng/L (ref <18)

## 2022-02-16 NOTE — ED Notes (Signed)
Pt not able to afford the Eliquis that was prescribed recently.  ?

## 2022-02-16 NOTE — ED Triage Notes (Addendum)
[facility] called out for CP, found pt in AFib in 130's at rest and increased to 190's with getting up.  En route pt 's HR back SR at 72, baby ASA x 4 given en route. IV to lt AC 20 G. Pt with hx of AFIB. Pt denies taking blood thinners.  Pt converted to SR en route.  ?

## 2022-02-16 NOTE — ED Provider Notes (Signed)
?[facility] ?Provider Note ? ?CSN: [id number] ?Arrival date & time: 02/16/22 2043 ? ?Chief Complaint(s) ?Chest Pain ? ?HPI ?Ronald Conner is a 61 y.o. male with PMH paroxysmal A-fib currently not on Eliquis due to being unable to afford it who presents emergency department for evaluation of chest pain and abnormal heart rate.  Patient states that he felt palpitations and chest pain began suddenly tonight and called EMS who found the patient to be in A-fib with RVR.  This rhythm has since self converted and he arrives in sinus bradycardia.  He currently denies chest pain, shortness of breath, abdominal pain, nausea, vomiting or other systemic symptoms. ? ? ?Chest Pain ?Associated symptoms: palpitations   ? ?Past Medical History ?Past Medical History:  ?Diagnosis Date  ? Acid reflux   ? Afib ([place])   ? Atypical chest pain   ? COVID   ? Family history of adverse reaction to anesthesia   ? MOM- PONV  ? GERD (gastroesophageal reflux disease) 10/03/2017  ? Hypertension   ? Premature atrial contractions   ? PVC's (premature ventricular contractions)   ? Sinus bradycardia   ? ?Patient Active Problem List  ? Diagnosis Date Noted  ? Hypertension 12/05/2019  ? Chronic cholecystitis   ? GERD (gastroesophageal reflux disease) 10/03/2017  ? Gastroesophageal reflux disease without esophagitis 10/03/2017  ? ?Home Medication(s) ?Prior to Admission medications   ?Medication Sig Start Date End Date Taking? Authorizing Provider  ?ALPRAZolam (XANAX) 0.25 MG tablet Take 0.25 mg by mouth 3 (three) times daily as needed for anxiety.    [provider]  ?apixaban (ELIQUIS) 5 MG TABS tablet Take 1 tablet (5 mg total) by mouth 2 (two) times daily. 02/13/39   Delora Fuel, MD  ?benzonatate (TESSALON) 100 MG capsule Take 1 capsule (100 mg total) by mouth 3 (three) times daily as needed for cough. 12/11/20   Emerson Monte, FNP  ?cetirizine (ZYRTEC ALLERGY) 10 MG tablet Take 1 tablet (10 mg total) by mouth daily.  12/11/20   Emerson Monte, FNP  ?cyclobenzaprine (FLEXERIL) 10 MG tablet Take 1 tablet (10 mg total) by mouth 3 (three) times daily as needed for muscle spasms. 03/27/18   Orpah Greek, MD  ?HYDROcodone-acetaminophen (NORCO/VICODIN) 5-325 MG tablet Take 1 tablet by mouth every 6 (six) hours as needed for moderate pain. 11/07/17   Aviva Signs, MD  ?lidocaine (XYLOCAINE) 2 % solution Use as directed 15 mLs in the mouth or throat as needed for mouth pain. 12/11/20   Emerson Monte, FNP  ?losartan (COZAAR) 100 MG tablet Take 1 tablet (100 mg total) by mouth at bedtime. 04/24/20   Strader, Fransisco Hertz, PA-C  ?metoprolol tartrate (LOPRESSOR) 25 MG tablet Take 0.5 tablets (12.5 mg total) by mouth 2 (two) times daily as needed (palpitations). ?Patient not taking: Reported on 04/24/2020 03/19/20   Evalee Jefferson, PA-C  ?Multiple Vitamin (MULTIVITAMIN) tablet Take 1 tablet by mouth daily.    [provider]  ?                                                                                                                                  ?  Past Surgical History ?Past Surgical History:  ?Procedure Laterality Date  ? CHOLECYSTECTOMY N/A 11/07/2017  ? Procedure: LAPAROSCOPIC CHOLECYSTECTOMY;  Surgeon: Aviva Signs, MD;  Location: [facility];  Service: General;  Laterality: N/A;  ? COLONOSCOPY    ? ESOPHAGOGASTRODUODENOSCOPY N/A 10/07/2017  ? Procedure: ESOPHAGOGASTRODUODENOSCOPY (EGD);  Surgeon: Rogene Houston, MD;  Location: [facility];  Service: Endoscopy;  Laterality: N/A;  12:00pm  ? GALLBLADDER SURGERY  11/07/2017  ? NO PAST SURGERIES    ? ?Family History ?Family History  ?Problem Relation Age of Onset  ? Arrhythmia Mother   ?     h/o DCCV  ? Leukemia Mother   ? Hypertension Mother   ? Cataracts Mother   ? Peripheral Artery Disease Mother   ? Kidney failure Mother   ? Heart attack Father 37  ? Kidney failure Brother   ?     on dialysis  ? Hypertension Brother   ? Arrhythmia Maternal Grandmother   ? Heart  attack Maternal Grandmother   ? Stroke Maternal Grandfather   ? Peripheral Artery Disease Maternal Grandfather   ?     double amputee  ? Dementia Paternal Grandmother   ? Diabetes Brother   ? Obesity Brother   ? ? ?Social History ?Social History  ? ?Tobacco Use  ? Smoking status: Never  ? Smokeless tobacco: Former  ?  Types: Snuff  ?  Quit date: 11/05/2015  ? Tobacco comments:  ?  dips snuff for 25 years.cigar once or twicea year  ?Vaping Use  ? Vaping Use: Never used  ?Substance Use Topics  ? Alcohol use: No  ? Drug use: No  ? ?Allergies ?Clonidine derivatives, Lisinopril, Protonix [pantoprazole sodium], and Iodine ? ?Review of Systems ?Review of Systems  ?Cardiovascular:  Positive for chest pain and palpitations.  ? ?Physical Exam ?Vital Signs  ?I have reviewed the triage vital signs ?BP (!) 139/98   Pulse (!) 52   Temp 98 ?F (36.7 ?C)   Resp 11   Ht '5\' 10"'$  (1.778 m)   Wt 107 kg   SpO2 97%   BMI 33.86 kg/m?  ? ?Physical Exam ?Vitals and nursing note reviewed.  ?Constitutional:   ?   General: He is not in acute distress. ?   Appearance: He is well-developed.  ?HENT:  ?   Head: Normocephalic and atraumatic.  ?Eyes:  ?   Conjunctiva/sclera: Conjunctivae normal.  ?Cardiovascular:  ?   Rate and Rhythm: Regular rhythm. Bradycardia present.  ?   Heart sounds: No murmur heard. ?Pulmonary:  ?   Effort: Pulmonary effort is normal. No respiratory distress.  ?   Breath sounds: Normal breath sounds.  ?Abdominal:  ?   Palpations: Abdomen is soft.  ?   Tenderness: There is no abdominal tenderness.  ?Musculoskeletal:     ?   General: No swelling.  ?   Cervical back: Neck supple.  ?Skin: ?   General: Skin is warm and dry.  ?   Capillary Refill: Capillary refill takes less than 2 seconds.  ?Neurological:  ?   Mental Status: He is alert.  ?Psychiatric:     ?   Mood and Affect: Mood normal.  ? ? ?ED Results and Treatments ?Labs ?(all labs ordered are listed, but only abnormal results are displayed) ?Labs Reviewed  ?BASIC  METABOLIC PANEL - Abnormal; Notable for the following components:  ?    Result Value  ? Calcium 8.7 (*)   ? All other components within normal limits  ?  CBC  ?TROPONIN I (HIGH SENSITIVITY)  ?                                                                                                                       ? ?Radiology ?DG Chest 2 View ? ?Result Date: 02/16/2022 ?CLINICAL DATA:  Chest pain EXAM: CHEST - 2 VIEW COMPARISON:  Radiograph 03/19/2020 FINDINGS: Unchanged cardiomediastinal silhouette. There is no focal airspace consolidation. There is no large pleural effusion. There is no visible pneumothorax. There is no acute osseous abnormality. Thoracic spondylosis. IMPRESSION: No evidence of acute cardiopulmonary disease. Electronically Signed   By: Maurine Simmering M.D.   On: 02/16/2022 21:36   ? ?Pertinent labs & imaging results that were available during my care of the patient were reviewed by me and considered in my medical decision making (see MDM for details). ? ?Medications Ordered in ED ?Medications - No data to display                                                               ?                                                                    ?Procedures ?Procedures ? ?(including critical care time) ? ?Medical Decision Making / ED Course ? ? ?This patient presents to the ED for concern of paroxysmal A-fib, this involves an extensive number of treatment options, and is a complaint that carries with it a high risk of complications and morbidity.  The differential diagnosis includes paroxysmal A-fib, chronic A-fib, electrolyte abnormality dehydration ? ?MDM: ?Patient seen emergency room for evaluation of palpitations and chest pain.  Physical exam is unremarkable.  Laboratory evaluation unremarkable including high-sensitivity troponin.  ECG was sinus bradycardia.  Chest x-ray negative.  I spoke with the pharmacy and delivered the patient his Eliquis starter pack here in the emergency department and he will need  to follow-up with his PCP or cardiologist to discuss further financial assistance.  I consulted cardiology to discuss whether or not the patient can start a antiarrhythmic here in the emergency department given his

## 2022-02-23 ENCOUNTER — Ambulatory Visit (HOSPITAL_COMMUNITY): Payer: Medicare Other | Admitting: Physician Assistant

## 2022-03-09 ENCOUNTER — Ambulatory Visit (HOSPITAL_COMMUNITY): Payer: Medicare Other | Admitting: Physician Assistant

## 2022-03-19 ENCOUNTER — Encounter: Payer: Self-pay | Admitting: Student

## 2022-03-19 ENCOUNTER — Other Ambulatory Visit: Payer: Self-pay | Admitting: Student

## 2022-03-19 ENCOUNTER — Ambulatory Visit (INDEPENDENT_AMBULATORY_CARE_PROVIDER_SITE_OTHER): Payer: Medicare Other | Admitting: Student

## 2022-03-19 VITALS — BP 138/78 | HR 48 | Ht 69.0 in | Wt 237.0 lb

## 2022-03-19 DIAGNOSIS — I10 Essential (primary) hypertension: Secondary | ICD-10-CM | POA: Diagnosis not present

## 2022-03-19 DIAGNOSIS — I480 Paroxysmal atrial fibrillation: Secondary | ICD-10-CM

## 2022-03-19 MED ORDER — METOPROLOL TARTRATE 25 MG PO TABS
12.5000 mg | ORAL_TABLET | Freq: Two times a day (BID) | ORAL | 1 refills | Status: DC | PRN
Start: 2022-03-19 — End: 2022-03-22

## 2022-03-19 NOTE — Progress Notes (Signed)
? ?Cardiology Office Note   ? ?Date:  03/20/2022  ? ?ID:  Ronald Conner, DOB [birth year], MRN [id number] ? ?PCP:  Celene Squibb, MD  ?Cardiologist: Carlyle Dolly, MD   ? ?Chief Complaint  ?Patient presents with  ? Follow-up  ?  Recent Emergency Dept visits  ? ? ?History of Present Illness:   ? ?Ronald Conner is a 61 y.o. male with past medical history of paroxysmal atrial fibrillation (initial episode in 02/2020 with spontaneous conversion back to normal sinus rhythm), HTN, GERD and baseline bradycardia who presents to the office today for follow-up from recent Emergency Department visits. ? ?He was examined by myself in 04/2020 and had recently been in the ED for evaluation of palpitations and found to be in atrial fibrillation with RVR.  He did convert back to normal sinus rhythm. Was not on anticoagulation given his CHA2DS2-VASc score of 1 but was given a prescription for as needed Lopressor as his baseline bradycardia would not allow for daily dosing of AV nodal blocking agents. ? ?He was evaluated at [facility] ED on 02/06/2022 for worsening palpitations and was found to be in atrial fibrillation with RVR and spontaneously converted to normal sinus rhythm prior to administration of medications. He was started on anticoagulation for at least 30 days and referred to the atrial fibrillation clinic.  He presented back to the ED on 02/16/2022 for evaluation of chest pain and had been in atrial fibrillation with RVR upon EMS arriving to his home but converted back to normal sinus rhythm prior to arrival to the ED. ? ?In talking with the patient today, he feels that both episodes of atrial fibrillation were triggered by him stopping Xanax. Was previously prescribed this by his PCP to help with sleep and did not notice any change in symptoms, therefore he stopped this twice and his episodes of atrial fibrillation happened 2-3 days after discontinuing the medication. He has since been off of it with no recurrent  episodes. He denies any recent chest pain or dyspnea on exertion. No recent orthopnea, PND or pitting edema. He has been consuming several cups of coffee daily along with occasional soda and tea.  ? ? ?Past Medical History:  ?Diagnosis Date  ? Acid reflux   ? Afib ([place])   ? Atypical chest pain   ? COVID   ? Family history of adverse reaction to anesthesia   ? MOM- PONV  ? GERD (gastroesophageal reflux disease) 10/03/2017  ? Hypertension   ? Premature atrial contractions   ? PVC's (premature ventricular contractions)   ? Sinus bradycardia   ? ? ?Past Surgical History:  ?Procedure Laterality Date  ? CHOLECYSTECTOMY N/A 11/07/2017  ? Procedure: LAPAROSCOPIC CHOLECYSTECTOMY;  Surgeon: Aviva Signs, MD;  Location: [facility];  Service: General;  Laterality: N/A;  ? COLONOSCOPY    ? ESOPHAGOGASTRODUODENOSCOPY N/A 10/07/2017  ? Procedure: ESOPHAGOGASTRODUODENOSCOPY (EGD);  Surgeon: Rogene Houston, MD;  Location: [facility];  Service: Endoscopy;  Laterality: N/A;  12:00pm  ? GALLBLADDER SURGERY  11/07/2017  ? NO PAST SURGERIES    ? ? ?Current Medications: ?Outpatient Medications Prior to Visit  ?Medication Sig Dispense Refill  ? cetirizine (ZYRTEC ALLERGY) 10 MG tablet Take 1 tablet (10 mg total) by mouth daily. 30 tablet 0  ? HYDROcodone-acetaminophen (NORCO/VICODIN) 5-325 MG tablet Take 1 tablet by mouth every 6 (six) hours as needed for moderate pain. 25 tablet 0  ? losartan (COZAAR) 100 MG tablet Take 1 tablet (100 mg total)  by mouth at bedtime. 90 tablet 3  ? Multiple Vitamin (MULTIVITAMIN) tablet Take 1 tablet by mouth daily.    ? apixaban (ELIQUIS) 5 MG TABS tablet Take 1 tablet (5 mg total) by mouth 2 (two) times daily. 60 tablet 0  ? aspirin EC 81 MG tablet Take 81 mg by mouth daily. Swallow whole.    ? benzonatate (TESSALON) 100 MG capsule Take 1 capsule (100 mg total) by mouth 3 (three) times daily as needed for cough. 30 capsule 0  ? cyclobenzaprine (FLEXERIL) 10 MG tablet Take 1 tablet (10 mg total) by mouth  3 (three) times daily as needed for muscle spasms. 20 tablet 0  ? lidocaine (XYLOCAINE) 2 % solution Use as directed 15 mLs in the mouth or throat as needed for mouth pain. 100 mL 1  ? ALPRAZolam (XANAX) 0.25 MG tablet Take 0.25 mg by mouth 3 (three) times daily as needed for anxiety. (Patient not taking: Reported on 03/19/2022)    ? metoprolol tartrate (LOPRESSOR) 25 MG tablet Take 0.5 tablets (12.5 mg total) by mouth 2 (two) times daily as needed (palpitations). (Patient not taking: Reported on 03/19/2022) 30 tablet 0  ? ?No facility-administered medications prior to visit.  ?  ? ?Allergies:   Clonidine derivatives, Lisinopril, Protonix [pantoprazole sodium], and Iodine  ? ?Social History  ? ?Socioeconomic History  ? Marital status: Married  ?  Spouse name: Not on file  ? Number of children: Not on file  ? Years of education: Not on file  ? Highest education level: Not on file  ?Occupational History  ? Not on file  ?Tobacco Use  ? Smoking status: Never  ? Smokeless tobacco: Former  ?  Types: Snuff  ?  Quit date: 11/05/2015  ? Tobacco comments:  ?  dips snuff for 25 years.cigar once or twicea year  ?Vaping Use  ? Vaping Use: Never used  ?Substance and Sexual Activity  ? Alcohol use: No  ? Drug use: No  ? Sexual activity: Yes  ?  Birth control/protection: None  ?Other Topics Concern  ? Not on file  ?Social History Narrative  ? Not on file  ? ?Social Determinants of Health  ? ?Financial Resource Strain: Not on file  ?Food Insecurity: Not on file  ?Transportation Needs: Not on file  ?Physical Activity: Not on file  ?Stress: Not on file  ?Social Connections: Not on file  ?  ? ?Family History:  The patient's family history includes Arrhythmia in his maternal grandmother and mother; Cataracts in his mother; Dementia in his paternal grandmother; Diabetes in his brother; Heart attack in his maternal grandmother; Heart attack (age of onset: 78) in his father; Hypertension in his brother and mother; Kidney failure in his  brother and mother; Leukemia in his mother; Obesity in his brother; Peripheral Artery Disease in his maternal grandfather and mother; Stroke in his maternal grandfather.  ? ?Review of Systems:   ? ?Please see the history of present illness.    ? ?All other systems reviewed and are otherwise negative except as noted above. ? ? ?Physical Exam:   ? ?VS:  BP 138/78   Pulse (!) 48   Ht '5\' 9"'$  (1.753 m)   Wt 237 lb (107.5 kg)   SpO2 99%   BMI 35.00 kg/m?    ?General: Well developed, well nourished,male appearing in no acute distress. ?Head: Normocephalic, atraumatic. ?Neck: No carotid bruits. JVD not elevated.  ?Lungs: Respirations regular and unlabored, without wheezes or rales.  ?Heart:  Regular rhythm, bradycardiac rate. No S3 or S4.  No murmur, no rubs, or gallops appreciated. ?Abdomen: Appears non-distended. No obvious abdominal masses. ?Msk:  Strength and tone appear normal for age. No obvious joint deformities or effusions. ?Extremities: No clubbing or cyanosis. No edema.  Distal pedal pulses are 2+ bilaterally. ?Neuro: Alert and oriented X 3. Moves all extremities spontaneously. No focal deficits noted. ?Psych:  Responds to questions appropriately with a normal affect. ?Skin: No rashes or lesions noted ? ?Wt Readings from Last 3 Encounters:  ?03/19/22 237 lb (107.5 kg)  ?02/16/22 236 lb (107 kg)  ?02/05/22 230 lb (104.3 kg)  ? ? ? ? ?Studies/Labs Reviewed:  ? ?EKG:  EKG is ordered today.  The ekg ordered today demonstrates sinus bradycardia, HR 48 with no acute ST changes.  ? ?Recent Labs: ?02/06/2022: Magnesium 2.1 ?02/16/2022: BUN 11; Creatinine, Ser 0.91; Hemoglobin 15.5; Platelets 230; Potassium 3.5; Sodium 141  ? ?Lipid Panel ?No results found for: CHOL, TRIG, HDL, CHOLHDL, VLDL, LDLCALC, LDLDIRECT ? ?Additional studies/ records that were reviewed today include:  ? ?Coronary CT: 09/2018 ?FINDINGS: ?Non-cardiac: See separate report from [facility]. ?  ?Pulmonary veins drained normally to the left  atrium. ?  ?Calcium Score: 0 Agatston units. ?  ?Coronary Arteries: Right dominant with no anomalies ?  ?LM: No plaque or stenosis. ?  ?LAD system:  No plaque or stenosis. ?  ?Circumflex system: No plaque or stenosis.

## 2022-03-19 NOTE — Patient Instructions (Signed)
If you experience palpitations, check your heart rate and blood pressure. If elevated, take 1/2 of a Lopressor tablet. Wait 20-30 minutes and if still having palpitations, check your vitals. If the top number of your blood pressure is over 100 and your heart rate is over 60, can take an additional 1/2 tablet of Lopressor. Wait 20-30 minutes and if no improvement, proceed to the Emergency Department.  ? ?Medication Instructions:  ?Take Lopressor 12.5 mg as needed   ? ?*If you need a refill on your cardiac medications before your next appointment, please call your pharmacy* ? ? ?Lab Work: ?NONE  ? ?If you have labs (blood work) drawn today and your tests are completely normal, you will receive your results only by: ?MyChart Message (if you have MyChart) OR ?A paper copy in the mail ?If you have any lab test that is abnormal or we need to change your treatment, we will call you to review the results. ? ? ?Testing/Procedures: ?NONE  ? ? ?Follow-Up: ?At [facility], you and your health needs are our priority.  As part of our continuing mission to provide you with exceptional heart care, we have created designated Provider Care Teams.  These Care Teams include your primary Cardiologist (physician) and Advanced Practice Providers (APPs -  Physician Assistants and Nurse Practitioners) who all work together to provide you with the care you need, when you need it. ? ?We recommend signing up for the patient portal called "MyChart".  Sign up information is provided on this After Visit Summary.  MyChart is used to connect with patients for Virtual Visits (Telemedicine).  Patients are able to view lab/test results, encounter notes, upcoming appointments, etc.  Non-urgent messages can be sent to your provider as well.   ?To learn more about what you can do with MyChart, go to [email or web address].   ? ?Your next appointment:   ?1 year(s) ? ?The format for your next appointment:   ?In Person ? ?Provider:   ?Carlyle Dolly,  MD or Bernerd Pho, PA-C  ? ? ?Other Instructions ?Thank you for choosing [facility]! ?  ? ?Important Information About Sugar ? ? ? ? ?  ?

## 2022-03-20 ENCOUNTER — Encounter: Payer: Self-pay | Admitting: Student

## 2022-04-30 DIAGNOSIS — R519 Headache, unspecified: Secondary | ICD-10-CM | POA: Diagnosis not present

## 2022-04-30 DIAGNOSIS — J069 Acute upper respiratory infection, unspecified: Secondary | ICD-10-CM | POA: Diagnosis not present

## 2022-04-30 DIAGNOSIS — R220 Localized swelling, mass and lump, head: Secondary | ICD-10-CM | POA: Diagnosis not present

## 2022-04-30 DIAGNOSIS — R0981 Nasal congestion: Secondary | ICD-10-CM | POA: Diagnosis not present

## 2022-05-27 DIAGNOSIS — I10 Essential (primary) hypertension: Secondary | ICD-10-CM | POA: Diagnosis not present

## 2022-05-31 DIAGNOSIS — I8393 Asymptomatic varicose veins of bilateral lower extremities: Secondary | ICD-10-CM | POA: Diagnosis not present

## 2022-05-31 DIAGNOSIS — E781 Pure hyperglyceridemia: Secondary | ICD-10-CM | POA: Diagnosis not present

## 2022-05-31 DIAGNOSIS — G894 Chronic pain syndrome: Secondary | ICD-10-CM | POA: Diagnosis not present

## 2022-05-31 DIAGNOSIS — I10 Essential (primary) hypertension: Secondary | ICD-10-CM | POA: Diagnosis not present

## 2022-05-31 DIAGNOSIS — F419 Anxiety disorder, unspecified: Secondary | ICD-10-CM | POA: Diagnosis not present

## 2022-05-31 DIAGNOSIS — M542 Cervicalgia: Secondary | ICD-10-CM | POA: Diagnosis not present

## 2022-05-31 DIAGNOSIS — Z0001 Encounter for general adult medical examination with abnormal findings: Secondary | ICD-10-CM | POA: Diagnosis not present

## 2022-05-31 DIAGNOSIS — R7301 Impaired fasting glucose: Secondary | ICD-10-CM | POA: Diagnosis not present

## 2022-05-31 DIAGNOSIS — I480 Paroxysmal atrial fibrillation: Secondary | ICD-10-CM | POA: Diagnosis not present

## 2022-05-31 DIAGNOSIS — E669 Obesity, unspecified: Secondary | ICD-10-CM | POA: Diagnosis not present

## 2022-05-31 DIAGNOSIS — K219 Gastro-esophageal reflux disease without esophagitis: Secondary | ICD-10-CM | POA: Diagnosis not present

## 2022-06-07 ENCOUNTER — Ambulatory Visit (INDEPENDENT_AMBULATORY_CARE_PROVIDER_SITE_OTHER): Payer: Medicare Other | Admitting: Cardiology

## 2022-06-07 ENCOUNTER — Encounter: Payer: Self-pay | Admitting: Cardiology

## 2022-06-07 VITALS — BP 136/84 | HR 52 | Ht 70.0 in | Wt 230.4 lb

## 2022-06-07 DIAGNOSIS — I10 Essential (primary) hypertension: Secondary | ICD-10-CM

## 2022-06-07 DIAGNOSIS — I480 Paroxysmal atrial fibrillation: Secondary | ICD-10-CM

## 2022-06-07 DIAGNOSIS — R001 Bradycardia, unspecified: Secondary | ICD-10-CM | POA: Diagnosis not present

## 2022-06-07 MED ORDER — AMLODIPINE BESYLATE 5 MG PO TABS: 5.0000 mg | ORAL_TABLET | Freq: Every day | ORAL | 3 refills | Status: DC

## 2022-06-07 NOTE — Patient Instructions (Signed)
Medication Instructions:  Your physician has recommended you make the following change in your medication:  Start Amlodipine 5 mg tablets once daily   Labwork: None  Testing/Procedures: None  Follow-Up: Follow up with Dr. Harl Bowie in 6 months.   Any Other Special Instructions Will Be Listed Below (If Applicable).     If you need a refill on your cardiac medications before your next appointment, please call your pharmacy.

## 2022-06-07 NOTE — Progress Notes (Signed)
Clinical Summary [name] is a 61 y.o.male seen today for follow up of the following medical problems.    1 . Bradycardia/Dizziness 03/27/18 EKG junctional rhythm 40s 02/2018 Holter: sinus brady, junctional brady 40, sinus pauses with junctional escape. PVCs, rare atach   - no recent symptoms.     2/ PAF - infrequent episodes - low CHADS2Vasc score, has not been on anticoag - just on prn lopressor, avoiding scheduled av nodal agents due to bradycardia  - noticed HR 140 at homes few days ago, some palpitations - took prn lopressor, improved.  - repeat episode yesterday, took lopressor and improved.     2. HTN - off lisinopril, now on losartan due to side effects per his report.  - previous hypokalemia on HCTZ - reports some high bp's at times.   - home bp's typically 135-140/85   3. Atypical chest pain - 05/2018 GXT 80% of THR, did not reach neccesary rate for ischemia evaluation. PVCs with exercise.   - 09/2018 coronary CTA: calcium score of 0, no significant CAD.    - no recent chest pain. He reports since gallbladder removed symptoms have completely resolved.         Past Medical History:  Diagnosis Date   Acid reflux    Afib (HCC)    Atypical chest pain    COVID    Family history of adverse reaction to anesthesia    MOM- PONV   GERD (gastroesophageal reflux disease) 10/03/2017   Hypertension    Premature atrial contractions    PVC's (premature ventricular contractions)    Sinus bradycardia      Allergies  Allergen Reactions   Clonidine Derivatives Other (See Comments)    Constipation-   Lisinopril Other (See Comments) and Cough    Severe back pain   Protonix [Pantoprazole Sodium] Other (See Comments)    Lightheaded, head aches   Iodine Rash     Current Outpatient Medications  Medication Sig Dispense Refill   cetirizine (ZYRTEC ALLERGY) 10 MG tablet Take 1 tablet (10 mg total) by mouth daily. 30 tablet 0   HYDROcodone-acetaminophen  (NORCO/VICODIN) 5-325 MG tablet Take 1 tablet by mouth every 6 (six) hours as needed for moderate pain. 25 tablet 0   losartan (COZAAR) 100 MG tablet Take 1 tablet (100 mg total) by mouth at bedtime. 90 tablet 3   metoprolol tartrate (LOPRESSOR) 25 MG tablet TAKE 1/2 TABLET(12.5 MG) BY MOUTH TWICE DAILY AS NEEDED FOR PALPITATIONS 90 tablet 3   Multiple Vitamin (MULTIVITAMIN) tablet Take 1 tablet by mouth daily.     No current facility-administered medications for this visit.     Past Surgical History:  Procedure Laterality Date   CHOLECYSTECTOMY N/A 11/07/2017   Procedure: LAPAROSCOPIC CHOLECYSTECTOMY;  Surgeon: Aviva Signs, MD;  Location: [facility];  Service: General;  Laterality: N/A;   COLONOSCOPY     ESOPHAGOGASTRODUODENOSCOPY N/A 10/07/2017   Procedure: ESOPHAGOGASTRODUODENOSCOPY (EGD);  Surgeon: Rogene Houston, MD;  Location: [facility];  Service: Endoscopy;  Laterality: N/A;  12:00pm   GALLBLADDER SURGERY  11/07/2017   NO PAST SURGERIES       Allergies  Allergen Reactions   Clonidine Derivatives Other (See Comments)    Constipation-   Lisinopril Other (See Comments) and Cough    Severe back pain   Protonix [Pantoprazole Sodium] Other (See Comments)    Lightheaded, head aches   Iodine Rash      Family History  Problem Relation Age of Onset  Arrhythmia Mother        h/o DCCV   Leukemia Mother    Hypertension Mother    Cataracts Mother    Peripheral Artery Disease Mother    Kidney failure Mother    Heart attack Father 22   Kidney failure Brother        on dialysis   Hypertension Brother    Arrhythmia Maternal Grandmother    Heart attack Maternal Grandmother    Stroke Maternal Grandfather    Peripheral Artery Disease Maternal Grandfather        double amputee   Dementia Paternal Grandmother    Diabetes Brother    Obesity Brother      Social History [name] reports that he has never smoked. He quit smokeless tobacco use about 6 years ago.  His  smokeless tobacco use included snuff. [name] reports no history of alcohol use.   Review of Systems CONSTITUTIONAL: No weight loss, fever, chills, weakness or fatigue.  HEENT: Eyes: No visual loss, blurred vision, double vision or yellow sclerae.No hearing loss, sneezing, congestion, runny nose or sore throat.  SKIN: No rash or itching.  CARDIOVASCULAR: per hpi RESPIRATORY: No shortness of breath, cough or sputum.  GASTROINTESTINAL: No anorexia, nausea, vomiting or diarrhea. No abdominal pain or blood.  GENITOURINARY: No burning on urination, no polyuria NEUROLOGICAL: No headache, dizziness, syncope, paralysis, ataxia, numbness or tingling in the extremities. No change in bowel or bladder control.  MUSCULOSKELETAL: No muscle, back pain, joint pain or stiffness.  LYMPHATICS: No enlarged nodes. No history of splenectomy.  PSYCHIATRIC: No history of depression or anxiety.  ENDOCRINOLOGIC: No reports of sweating, cold or heat intolerance. No polyuria or polydipsia.  Marland Kitchen   Physical Examination Today's Vitals   06/07/22 1019  BP: 136/84  Pulse: (!) 52  SpO2: 100%  Weight: 230 lb 6.4 oz (104.5 kg)  Height: '5\' 10"'$  (1.778 m)   Body mass index is 33.06 kg/m.  Gen: resting comfortably, no acute distress HEENT: no scleral icterus, pupils equal round and reactive, no palptable cervical adenopathy,  CV: Regular, brady, no m/r/g no jvd Resp: Clear to auscultation bilaterally GI: abdomen is soft, non-tender, non-distended, normal bowel sounds, no hepatosplenomegaly MSK: extremities are warm, no edema.  Skin: warm, no rash Neuro:  no focal deficits Psych: appropriate affect   Diagnostic Studies  02/2018 48 hr holter Sinus rhythm and sinus tachycardia with frequent PVCs and rare PACs with rare 3 beat atrial runs. No sustained arrhythmias. Symptoms correlated with sinus rhythm, sinus tachycardia, and PVCs.     05/2018 echo Study Conclusions   - Left ventricle: The cavity size was  normal. Wall thickness was   normal. The estimated ejection fraction was 55%. Wall motion was   normal; there were no regional wall motion abnormalities. Left   ventricular diastolic function parameters were normal for the   patient&'s age. - Aortic valve: Mildly calcified annulus. Trileaflet. - Mitral valve: There was mild regurgitation. - Left atrium: The atrium was mildly dilated. - Right atrium: The atrium was moderately dilated. - Tricuspid valve: There was trivial regurgitation. - Pulmonary arteries: Systolic pressure could not be accurately   estimated. - Pericardium, extracardiac: There was no pericardial effusion.     05/2018 GXT No diagnostic ST segment changes at maximum workload of 10.1 METS and peak heart rate of 130 bpm which represented 80% of the MPHR. This excludes chronotropic incompetence, however is nondiagnostic for ischemia. There were episodes of occasional to frequent PVCs during exercise,  no sustained arrhythmias. Hypertensive response noted. Duke treadmill score not calculated due to failure to achieve target heart rate. Blood pressure demonstrated a hypertensive response to exercise.     09/2018 CTA IMPRESSION: 1. Coronary artery calcium score 0 Agatston units, suggesting low risk for future cardiac events.   2.  No significant coronary disease noted.     Assessment and Plan   1. Bradycardia -chronic bradycardia, sinus bradycardia and junction brady at times. Had normal chronocotropic compeptence by GXT -no symptoms, continue to monitor.  - EKG today sinus brady 53, 2 PVCs     2. HTN - above goal, start norvasc '5mg'$  daily     3.PAF - in general infrequent episodes but recently 2 episodes that resolved with prn lopressor - monitor at this time, if progressing would refer to EP. With chronic brady not able to take daily av nodal agents, would need to consider antiarrhythmic -CHADS2Vasc score is 1 for HTn, not on anticoag. In 2 years at age 48 score  would increase to 2 and anticoag would be indicated    F/u 6 months     Ronald Conner, M.D.

## 2022-06-08 ENCOUNTER — Ambulatory Visit (HOSPITAL_BASED_OUTPATIENT_CLINIC_OR_DEPARTMENT_OTHER): Payer: Medicare Other | Admitting: Family

## 2022-09-11 DIAGNOSIS — J019 Acute sinusitis, unspecified: Secondary | ICD-10-CM | POA: Diagnosis not present

## 2022-09-11 DIAGNOSIS — J302 Other seasonal allergic rhinitis: Secondary | ICD-10-CM | POA: Diagnosis not present

## 2022-10-27 ENCOUNTER — Ambulatory Visit: Payer: Medicare Other | Attending: Cardiology | Admitting: Cardiology

## 2022-10-27 ENCOUNTER — Encounter: Payer: Self-pay | Admitting: Cardiology

## 2022-10-27 VITALS — BP 142/80 | HR 52 | Ht 69.0 in | Wt 226.0 lb

## 2022-10-27 DIAGNOSIS — I10 Essential (primary) hypertension: Secondary | ICD-10-CM | POA: Diagnosis not present

## 2022-10-27 DIAGNOSIS — I480 Paroxysmal atrial fibrillation: Secondary | ICD-10-CM | POA: Diagnosis not present

## 2022-10-27 DIAGNOSIS — R001 Bradycardia, unspecified: Secondary | ICD-10-CM | POA: Diagnosis not present

## 2022-10-27 NOTE — Progress Notes (Signed)
Clinical Summary [name] is a 61 y.o.male seen today for follow up of the following medical problems.    1 . Bradycardia/Dizziness 03/27/18 EKG junctional rhythm 40s 02/2018 Holter: sinus brady, junctional brady 40, sinus pauses with junctional escape. PVCs, rare atach   - no significant dizziness     2. PAF - infrequent episodes - low CHADS2Vasc score, has not been on anticoag - just on prn lopressor, avoiding scheduled av nodal agents due to bradycardia   - noticed HR 140 at homes few days ago, some palpitations - took prn lopressor, improved.  - repeat episode yesterday, took lopressor and improved.   - rare palpitations     2. HTN - off lisinopril, now on losartan due to side effects per his report.  - previous hypokalemia on HCTZ -he is compliant with meds    3. Atypical chest pain - 05/2018 GXT 80% of THR, did not reach neccesary rate for ischemia evaluation. PVCs with exercise.   - 09/2018 coronary CTA: calcium score of 0, no significant CAD.    - no recent chest pain. He reports since gallbladder removed symptoms have completely resolved.      Past Medical History:  Diagnosis Date   Acid reflux    Afib (HCC)    Atypical chest pain    COVID    Family history of adverse reaction to anesthesia    MOM- PONV   GERD (gastroesophageal reflux disease) 10/03/2017   Hypertension    Premature atrial contractions    PVC's (premature ventricular contractions)    Sinus bradycardia      Allergies  Allergen Reactions   Clonidine Derivatives Other (See Comments)    Constipation-   Lisinopril Other (See Comments) and Cough    Severe back pain   Protonix [Pantoprazole Sodium] Other (See Comments)    Lightheaded, head aches   Iodine Rash     Current Outpatient Medications  Medication Sig Dispense Refill   amLODipine (NORVASC) 5 MG tablet Take 1 tablet (5 mg total) by mouth daily. 90 tablet 3   cetirizine (ZYRTEC ALLERGY) 10 MG tablet Take 1 tablet  (10 mg total) by mouth daily. 30 tablet 0   HYDROcodone-acetaminophen (NORCO/VICODIN) 5-325 MG tablet Take 1 tablet by mouth every 6 (six) hours as needed for moderate pain. 25 tablet 0   losartan (COZAAR) 100 MG tablet Take 1 tablet (100 mg total) by mouth at bedtime. 90 tablet 3   metoprolol tartrate (LOPRESSOR) 25 MG tablet TAKE 1/2 TABLET(12.5 MG) BY MOUTH TWICE DAILY AS NEEDED FOR PALPITATIONS 90 tablet 3   Multiple Vitamin (MULTIVITAMIN) tablet Take 1 tablet by mouth daily.     SSD 1 % cream SMARTSIG:0.0625 Inch(es) Topical Twice Daily     No current facility-administered medications for this visit.     Past Surgical History:  Procedure Laterality Date   CHOLECYSTECTOMY N/A 11/07/2017   Procedure: LAPAROSCOPIC CHOLECYSTECTOMY;  Surgeon: Aviva Signs, MD;  Location: [facility];  Service: General;  Laterality: N/A;   COLONOSCOPY     ESOPHAGOGASTRODUODENOSCOPY N/A 10/07/2017   Procedure: ESOPHAGOGASTRODUODENOSCOPY (EGD);  Surgeon: Rogene Houston, MD;  Location: [facility];  Service: Endoscopy;  Laterality: N/A;  12:00pm   GALLBLADDER SURGERY  11/07/2017   NO PAST SURGERIES       Allergies  Allergen Reactions   Clonidine Derivatives Other (See Comments)    Constipation-   Lisinopril Other (See Comments) and Cough    Severe back pain   Protonix [  Pantoprazole Sodium] Other (See Comments)    Lightheaded, head aches   Iodine Rash      Family History  Problem Relation Age of Onset   Arrhythmia Mother        h/o DCCV   Leukemia Mother    Hypertension Mother    Cataracts Mother    Peripheral Artery Disease Mother    Kidney failure Mother    Heart attack Father 65   Kidney failure Brother        on dialysis   Hypertension Brother    Arrhythmia Maternal Grandmother    Heart attack Maternal Grandmother    Stroke Maternal Grandfather    Peripheral Artery Disease Maternal Grandfather        double amputee   Dementia Paternal Grandmother    Diabetes Brother    Obesity  Brother      Social History [name] reports that he has never smoked. He quit smokeless tobacco use about 6 years ago.  His smokeless tobacco use included snuff. [name] reports no history of alcohol use.   Review of Systems CONSTITUTIONAL: No weight loss, fever, chills, weakness or fatigue.  HEENT: Eyes: No visual loss, blurred vision, double vision or yellow sclerae.No hearing loss, sneezing, congestion, runny nose or sore throat.  SKIN: No rash or itching.  CARDIOVASCULAR: per hpi RESPIRATORY: No shortness of breath, cough or sputum.  GASTROINTESTINAL: No anorexia, nausea, vomiting or diarrhea. No abdominal pain or blood.  GENITOURINARY: No burning on urination, no polyuria NEUROLOGICAL: No headache, dizziness, syncope, paralysis, ataxia, numbness or tingling in the extremities. No change in bowel or bladder control.  MUSCULOSKELETAL: No muscle, back pain, joint pain or stiffness.  LYMPHATICS: No enlarged nodes. No history of splenectomy.  PSYCHIATRIC: No history of depression or anxiety.  ENDOCRINOLOGIC: No reports of sweating, cold or heat intolerance. No polyuria or polydipsia.  Marland Kitchen   Physical Examination Today's Vitals   10/27/22 0953  BP: (!) 142/80  Pulse: (!) 52  SpO2: 99%  Weight: 226 lb (102.5 kg)  Height: '5\' 9"'$  (1.753 m)   Body mass index is 33.37 kg/m.  Gen: resting comfortably, no acute distress HEENT: no scleral icterus, pupils equal round and reactive, no palptable cervical adenopathy,  CV: regular, brady, no m/r/g Resp: Clear to auscultation bilaterally GI: abdomen is soft, non-tender, non-distended, normal bowel sounds, no hepatosplenomegaly MSK: extremities are warm, no edema.  Skin: warm, no rash Neuro:  no focal deficits Psych: appropriate affect   Diagnostic Studies   02/2018 48 hr holter Sinus rhythm and sinus tachycardia with frequent PVCs and rare PACs with rare 3 beat atrial runs. No sustained arrhythmias. Symptoms correlated with  sinus rhythm, sinus tachycardia, and PVCs.     05/2018 echo Study Conclusions   - Left ventricle: The cavity size was normal. Wall thickness was   normal. The estimated ejection fraction was 55%. Wall motion was   normal; there were no regional wall motion abnormalities. Left   ventricular diastolic function parameters were normal for the   patient&'s age. - Aortic valve: Mildly calcified annulus. Trileaflet. - Mitral valve: There was mild regurgitation. - Left atrium: The atrium was mildly dilated. - Right atrium: The atrium was moderately dilated. - Tricuspid valve: There was trivial regurgitation. - Pulmonary arteries: Systolic pressure could not be accurately   estimated. - Pericardium, extracardiac: There was no pericardial effusion.     05/2018 GXT No diagnostic ST segment changes at maximum workload of 10.1 METS and peak heart rate  of 130 bpm which represented 80% of the MPHR. This excludes chronotropic incompetence, however is nondiagnostic for ischemia. There were episodes of occasional to frequent PVCs during exercise, no sustained arrhythmias. Hypertensive response noted. Duke treadmill score not calculated due to failure to achieve target heart rate. Blood pressure demonstrated a hypertensive response to exercise.     09/2018 CTA IMPRESSION: 1. Coronary artery calcium score 0 Agatston units, suggesting low risk for future cardiac events.   2.  No significant coronary disease noted.  Assessment and Plan   1. Bradycardia -chronic bradycardia, sinus bradycardia and junction brady at times. Had normal chronocotropic compeptence by GXT -- no symptoms, continue to monitor     2. HTN - at goal, continue current meds     3.PAF - in general infrequent episodes controlledprn lopressor - With chronic brady not able to take daily av nodal agents, would need to consider antiarrhythmic if significant progression of afib in the future.  -CHADS2Vasc score is 1 for HTn, not on  anticoag. In 2 years at age 72 score would increase to 2 and anticoag would be indicated  F/u 6 months   Arnoldo Lenis, M.D.

## 2022-10-27 NOTE — Patient Instructions (Signed)
Medication Instructions:  Your physician recommends that you continue on your current medications as directed. Please refer to the Current Medication list given to you today.   Labwork: None  Testing/Procedures: None  Follow-Up: Follow up with Dr. Branch in 6 months.   Any Other Special Instructions Will Be Listed Below (If Applicable).     If you need a refill on your cardiac medications before your next appointment, please call your pharmacy.  

## 2022-11-25 DIAGNOSIS — I10 Essential (primary) hypertension: Secondary | ICD-10-CM | POA: Diagnosis not present

## 2022-11-25 DIAGNOSIS — E781 Pure hyperglyceridemia: Secondary | ICD-10-CM | POA: Diagnosis not present

## 2022-11-25 DIAGNOSIS — R7301 Impaired fasting glucose: Secondary | ICD-10-CM | POA: Diagnosis not present

## 2022-12-01 DIAGNOSIS — F419 Anxiety disorder, unspecified: Secondary | ICD-10-CM | POA: Diagnosis not present

## 2022-12-01 DIAGNOSIS — M542 Cervicalgia: Secondary | ICD-10-CM | POA: Diagnosis not present

## 2022-12-01 DIAGNOSIS — K219 Gastro-esophageal reflux disease without esophagitis: Secondary | ICD-10-CM | POA: Diagnosis not present

## 2022-12-01 DIAGNOSIS — Z713 Dietary counseling and surveillance: Secondary | ICD-10-CM | POA: Diagnosis not present

## 2022-12-01 DIAGNOSIS — I480 Paroxysmal atrial fibrillation: Secondary | ICD-10-CM | POA: Diagnosis not present

## 2022-12-01 DIAGNOSIS — E781 Pure hyperglyceridemia: Secondary | ICD-10-CM | POA: Diagnosis not present

## 2022-12-01 DIAGNOSIS — Z23 Encounter for immunization: Secondary | ICD-10-CM | POA: Diagnosis not present

## 2022-12-01 DIAGNOSIS — I10 Essential (primary) hypertension: Secondary | ICD-10-CM | POA: Diagnosis not present

## 2022-12-01 DIAGNOSIS — G894 Chronic pain syndrome: Secondary | ICD-10-CM | POA: Diagnosis not present

## 2022-12-01 DIAGNOSIS — E669 Obesity, unspecified: Secondary | ICD-10-CM | POA: Diagnosis not present

## 2022-12-01 DIAGNOSIS — R7303 Prediabetes: Secondary | ICD-10-CM | POA: Diagnosis not present

## 2022-12-01 DIAGNOSIS — I8393 Asymptomatic varicose veins of bilateral lower extremities: Secondary | ICD-10-CM | POA: Diagnosis not present

## 2022-12-30 ENCOUNTER — Encounter (HOSPITAL_COMMUNITY): Payer: Self-pay | Admitting: *Deleted

## 2023-03-02 DIAGNOSIS — I10 Essential (primary) hypertension: Secondary | ICD-10-CM | POA: Diagnosis not present

## 2023-03-02 DIAGNOSIS — Z6834 Body mass index (BMI) 34.0-34.9, adult: Secondary | ICD-10-CM | POA: Diagnosis not present

## 2023-03-02 DIAGNOSIS — I480 Paroxysmal atrial fibrillation: Secondary | ICD-10-CM | POA: Diagnosis not present

## 2023-03-02 DIAGNOSIS — E669 Obesity, unspecified: Secondary | ICD-10-CM | POA: Diagnosis not present

## 2023-03-02 DIAGNOSIS — K219 Gastro-esophageal reflux disease without esophagitis: Secondary | ICD-10-CM | POA: Diagnosis not present

## 2023-03-02 DIAGNOSIS — F419 Anxiety disorder, unspecified: Secondary | ICD-10-CM | POA: Diagnosis not present

## 2023-03-02 DIAGNOSIS — Z79899 Other long term (current) drug therapy: Secondary | ICD-10-CM | POA: Diagnosis not present

## 2023-03-02 DIAGNOSIS — I8393 Asymptomatic varicose veins of bilateral lower extremities: Secondary | ICD-10-CM | POA: Diagnosis not present

## 2023-03-02 DIAGNOSIS — R361 Hematospermia: Secondary | ICD-10-CM | POA: Diagnosis not present

## 2023-03-02 DIAGNOSIS — Z713 Dietary counseling and surveillance: Secondary | ICD-10-CM | POA: Diagnosis not present

## 2023-03-02 DIAGNOSIS — G894 Chronic pain syndrome: Secondary | ICD-10-CM | POA: Diagnosis not present

## 2023-03-02 DIAGNOSIS — M542 Cervicalgia: Secondary | ICD-10-CM | POA: Diagnosis not present

## 2023-03-03 ENCOUNTER — Other Ambulatory Visit: Payer: Self-pay | Admitting: Cardiology

## 2023-05-17 ENCOUNTER — Encounter: Payer: Self-pay | Admitting: Cardiology

## 2023-05-17 ENCOUNTER — Ambulatory Visit: Payer: Medicare Other | Attending: Cardiology | Admitting: Cardiology

## 2023-05-17 VITALS — BP 122/75 | HR 48 | Ht 69.0 in | Wt 226.6 lb

## 2023-05-17 DIAGNOSIS — R001 Bradycardia, unspecified: Secondary | ICD-10-CM | POA: Insufficient documentation

## 2023-05-17 DIAGNOSIS — I480 Paroxysmal atrial fibrillation: Secondary | ICD-10-CM | POA: Diagnosis not present

## 2023-05-17 DIAGNOSIS — I10 Essential (primary) hypertension: Secondary | ICD-10-CM | POA: Diagnosis not present

## 2023-05-17 NOTE — Progress Notes (Signed)
Clinical Summary Ronald Conner is a 62 y.o.male seen today for follow up of the following medical problems.      1 . Bradycardia/Dizziness 03/27/18 EKG junctional rhythm 40s 02/2018 Holter: sinus brady, junctional brady 40, sinus pauses with junctional escape. PVCs, rare atach   - no recent significant dizziness.      2. PAF - infrequent episodes - low CHADS2Vasc score, has not been on anticoag - just on prn lopressor, avoiding scheduled av nodal agents due to bradycardia     -no recent palpitations     3. HTN - off lisinopril, now on losartan due to side effects per his report.  - previous hypokalemia on HCTZ -compliant with meds. Home bp's 130s/70s     4. Atypical chest pain - 05/2018 GXT 80% of THR, did not reach neccesary rate for ischemia evaluation. PVCs with exercise.   - 09/2018 coronary CTA: calcium score of 0, no significant CAD.    - no recent chest pain. He reports since gallbladder removed symptoms have completely resolved.         Past Medical History:  Diagnosis Date   Acid reflux    Afib (HCC)    Atypical chest pain    COVID    Family history of adverse reaction to anesthesia    MOM- PONV   GERD (gastroesophageal reflux disease) 10/03/2017   Hypertension    Premature atrial contractions    PVC's (premature ventricular contractions)    Sinus bradycardia      Allergies  Allergen Reactions   Clonidine Derivatives Other (See Comments)    Constipation-   Lisinopril Other (See Comments) and Cough    Severe back pain   Protonix [Pantoprazole Sodium] Other (See Comments)    Lightheaded, head aches   Iodine Rash     Current Outpatient Medications  Medication Sig Dispense Refill   amLODipine (NORVASC) 5 MG tablet TAKE 1 TABLET(5 MG) BY MOUTH DAILY 90 tablet 1   cetirizine (ZYRTEC ALLERGY) 10 MG tablet Take 1 tablet (10 mg total) by mouth daily. 30 tablet 0   HYDROcodone-acetaminophen (NORCO/VICODIN) 5-325 MG tablet Take 1 tablet by mouth  every 6 (six) hours as needed for moderate pain. 25 tablet 0   losartan (COZAAR) 100 MG tablet Take 1 tablet (100 mg total) by mouth at bedtime. 90 tablet 3   Magnesium 250 MG TABS Take by mouth.     metoprolol tartrate (LOPRESSOR) 25 MG tablet TAKE 1/2 TABLET(12.5 MG) BY MOUTH TWICE DAILY AS NEEDED FOR PALPITATIONS 90 tablet 3   Multiple Vitamin (MULTIVITAMIN) tablet Take 1 tablet by mouth daily.     SSD 1 % cream SMARTSIG:0.0625 Inch(es) Topical Twice Daily     levocetirizine (XYZAL) 5 MG tablet Take 5 mg by mouth daily. (Patient not taking: Reported on 05/17/2023)     No current facility-administered medications for this visit.     Past Surgical History:  Procedure Laterality Date   CHOLECYSTECTOMY N/A 11/07/2017   Procedure: LAPAROSCOPIC CHOLECYSTECTOMY;  Surgeon: Franky Macho, MD;  Location: [facility];  Service: General;  Laterality: N/A;   COLONOSCOPY     ESOPHAGOGASTRODUODENOSCOPY N/A 10/07/2017   Procedure: ESOPHAGOGASTRODUODENOSCOPY (EGD);  Surgeon: Malissa Hippo, MD;  Location: [facility];  Service: Endoscopy;  Laterality: N/A;  12:00pm   GALLBLADDER SURGERY  11/07/2017   NO PAST SURGERIES       Allergies  Allergen Reactions   Clonidine Derivatives Other (See Comments)    Constipation-   Lisinopril  Other (See Comments) and Cough    Severe back pain   Protonix [Pantoprazole Sodium] Other (See Comments)    Lightheaded, head aches   Iodine Rash      Family History  Problem Relation Age of Onset   Arrhythmia Mother        h/o DCCV   Leukemia Mother    Hypertension Mother    Cataracts Mother    Peripheral Artery Disease Mother    Kidney failure Mother    Heart attack Father 54   Kidney failure Brother        on dialysis   Hypertension Brother    Arrhythmia Maternal Grandmother    Heart attack Maternal Grandmother    Stroke Maternal Grandfather    Peripheral Artery Disease Maternal Grandfather        double amputee   Dementia Paternal Grandmother     Diabetes Brother    Obesity Brother      Social History Ronald Conner reports that he has never smoked. He quit smokeless tobacco use about 7 years ago.  His smokeless tobacco use included snuff. Ronald Conner reports no history of alcohol use.   Review of Systems CONSTITUTIONAL: No weight loss, fever, chills, weakness or fatigue.  HEENT: Eyes: No visual loss, blurred vision, double vision or yellow sclerae.No hearing loss, sneezing, congestion, runny nose or sore throat.  SKIN: No rash or itching.  CARDIOVASCULAR: per hpi RESPIRATORY: No shortness of breath, cough or sputum.  GASTROINTESTINAL: No anorexia, nausea, vomiting or diarrhea. No abdominal pain or blood.  GENITOURINARY: No burning on urination, no polyuria NEUROLOGICAL: No headache, dizziness, syncope, paralysis, ataxia, numbness or tingling in the extremities. No change in bowel or bladder control.  MUSCULOSKELETAL: No muscle, back pain, joint pain or stiffness.  LYMPHATICS: No enlarged nodes. No history of splenectomy.  PSYCHIATRIC: No history of depression or anxiety.  ENDOCRINOLOGIC: No reports of sweating, cold or heat intolerance. No polyuria or polydipsia.  Marland Kitchen   Physical Examination Vitals:   05/17/23 0821 05/17/23 0852  BP: 138/76 122/75  Pulse: (!) 48   SpO2: 98%    Filed Weights   05/17/23 0821  Weight: 226 lb 9.6 oz (102.8 kg)    Gen: resting comfortably, no acute distress HEENT: no scleral icterus, pupils equal round and reactive, no palptable cervical adenopathy,  CV: RRR, no m/rg, no jvd Resp: Clear to auscultation bilaterally GI: abdomen is soft, non-tender, non-distended, normal bowel sounds, no hepatosplenomegaly MSK: extremities are warm, no edema.  Skin: warm, no rash Neuro:  no focal deficits Psych: appropriate affect   Diagnostic Studies  02/2018 48 hr holter Sinus rhythm and sinus tachycardia with frequent PVCs and rare PACs with rare 3 beat atrial runs. No sustained arrhythmias. Symptoms  correlated with sinus rhythm, sinus tachycardia, and PVCs.     05/2018 echo Study Conclusions   - Left ventricle: The cavity size was normal. Wall thickness was   normal. The estimated ejection fraction was 55%. Wall motion was   normal; there were no regional wall motion abnormalities. Left   ventricular diastolic function parameters were normal for the   patient&'s age. - Aortic valve: Mildly calcified annulus. Trileaflet. - Mitral valve: There was mild regurgitation. - Left atrium: The atrium was mildly dilated. - Right atrium: The atrium was moderately dilated. - Tricuspid valve: There was trivial regurgitation. - Pulmonary arteries: Systolic pressure could not be accurately   estimated. - Pericardium, extracardiac: There was no pericardial effusion.     05/2018 GXT  No diagnostic ST segment changes at maximum workload of 10.1 METS and peak heart rate of 130 bpm which represented 80% of the MPHR. This excludes chronotropic incompetence, however is nondiagnostic for ischemia. There were episodes of occasional to frequent PVCs during exercise, no sustained arrhythmias. Hypertensive response noted. Duke treadmill score not calculated due to failure to achieve target heart rate. Blood pressure demonstrated a hypertensive response to exercise.     09/2018 CTA IMPRESSION: 1. Coronary artery calcium score 0 Agatston units, suggesting low risk for future cardiac events.   2.  No significant coronary disease noted.   Assessment and Plan  1. Bradycardia -chronic bradycardia, sinus bradycardia and junction brady at times. Had normal chronocotropic compeptence by GXT - no symptoms, continue to monitor at this time     2. HTN - bp is at goal based on manural recheck, continue current meds     3.PAF - no recent symptoms.  - With chronic brady not able to take daily av nodal agents, would need to consider antiarrhythmic if significant progression of afib in the future.  -CHADS2Vasc  score is 1 for HTn, not on anticoag. Will need to start at age 1 next year.      F/u 6 months   Antoine Poche, M.D.

## 2023-05-17 NOTE — Patient Instructions (Signed)
Medication Instructions:  Continue all current medications.   Labwork: none  Testing/Procedures: none  Follow-Up: 6 months   Any Other Special Instructions Will Be Listed Below (If Applicable).   If you need a refill on your cardiac medications before your next appointment, please call your pharmacy.  

## 2023-05-30 DIAGNOSIS — E781 Pure hyperglyceridemia: Secondary | ICD-10-CM | POA: Diagnosis not present

## 2023-05-30 DIAGNOSIS — Z125 Encounter for screening for malignant neoplasm of prostate: Secondary | ICD-10-CM | POA: Diagnosis not present

## 2023-05-30 DIAGNOSIS — R7303 Prediabetes: Secondary | ICD-10-CM | POA: Diagnosis not present

## 2023-06-06 DIAGNOSIS — Z6834 Body mass index (BMI) 34.0-34.9, adult: Secondary | ICD-10-CM | POA: Diagnosis not present

## 2023-06-06 DIAGNOSIS — G894 Chronic pain syndrome: Secondary | ICD-10-CM | POA: Diagnosis not present

## 2023-06-06 DIAGNOSIS — M542 Cervicalgia: Secondary | ICD-10-CM | POA: Diagnosis not present

## 2023-06-06 DIAGNOSIS — R7303 Prediabetes: Secondary | ICD-10-CM | POA: Diagnosis not present

## 2023-06-06 DIAGNOSIS — Z7182 Exercise counseling: Secondary | ICD-10-CM | POA: Diagnosis not present

## 2023-06-06 DIAGNOSIS — Z713 Dietary counseling and surveillance: Secondary | ICD-10-CM | POA: Diagnosis not present

## 2023-06-06 DIAGNOSIS — I480 Paroxysmal atrial fibrillation: Secondary | ICD-10-CM | POA: Diagnosis not present

## 2023-06-06 DIAGNOSIS — I10 Essential (primary) hypertension: Secondary | ICD-10-CM | POA: Diagnosis not present

## 2023-06-06 DIAGNOSIS — F419 Anxiety disorder, unspecified: Secondary | ICD-10-CM | POA: Diagnosis not present

## 2023-06-06 DIAGNOSIS — K219 Gastro-esophageal reflux disease without esophagitis: Secondary | ICD-10-CM | POA: Diagnosis not present

## 2023-06-06 DIAGNOSIS — E781 Pure hyperglyceridemia: Secondary | ICD-10-CM | POA: Diagnosis not present

## 2023-06-06 DIAGNOSIS — E669 Obesity, unspecified: Secondary | ICD-10-CM | POA: Diagnosis not present

## 2023-06-07 ENCOUNTER — Encounter: Payer: Self-pay | Admitting: Internal Medicine

## 2023-08-08 DIAGNOSIS — Z6835 Body mass index (BMI) 35.0-35.9, adult: Secondary | ICD-10-CM | POA: Diagnosis not present

## 2023-08-08 DIAGNOSIS — J302 Other seasonal allergic rhinitis: Secondary | ICD-10-CM | POA: Diagnosis not present

## 2023-08-08 DIAGNOSIS — Z713 Dietary counseling and surveillance: Secondary | ICD-10-CM | POA: Diagnosis not present

## 2023-08-08 DIAGNOSIS — J069 Acute upper respiratory infection, unspecified: Secondary | ICD-10-CM | POA: Diagnosis not present

## 2023-09-06 DIAGNOSIS — F419 Anxiety disorder, unspecified: Secondary | ICD-10-CM | POA: Diagnosis not present

## 2023-09-06 DIAGNOSIS — I10 Essential (primary) hypertension: Secondary | ICD-10-CM | POA: Diagnosis not present

## 2023-09-06 DIAGNOSIS — R5383 Other fatigue: Secondary | ICD-10-CM | POA: Diagnosis not present

## 2023-09-06 DIAGNOSIS — J302 Other seasonal allergic rhinitis: Secondary | ICD-10-CM | POA: Diagnosis not present

## 2023-09-06 DIAGNOSIS — Z23 Encounter for immunization: Secondary | ICD-10-CM | POA: Diagnosis not present

## 2023-09-06 DIAGNOSIS — E669 Obesity, unspecified: Secondary | ICD-10-CM | POA: Diagnosis not present

## 2023-09-06 DIAGNOSIS — I480 Paroxysmal atrial fibrillation: Secondary | ICD-10-CM | POA: Diagnosis not present

## 2023-09-06 DIAGNOSIS — G894 Chronic pain syndrome: Secondary | ICD-10-CM | POA: Diagnosis not present

## 2023-09-06 DIAGNOSIS — M542 Cervicalgia: Secondary | ICD-10-CM | POA: Diagnosis not present

## 2023-10-28 ENCOUNTER — Ambulatory Visit: Payer: Medicare Other | Admitting: Cardiology

## 2023-11-26 ENCOUNTER — Other Ambulatory Visit: Payer: Self-pay | Admitting: Cardiology

## 2023-11-28 ENCOUNTER — Other Ambulatory Visit: Payer: Self-pay | Admitting: Cardiology

## 2023-12-02 DIAGNOSIS — R7303 Prediabetes: Secondary | ICD-10-CM | POA: Diagnosis not present

## 2023-12-02 DIAGNOSIS — E781 Pure hyperglyceridemia: Secondary | ICD-10-CM | POA: Diagnosis not present

## 2023-12-07 DIAGNOSIS — I480 Paroxysmal atrial fibrillation: Secondary | ICD-10-CM | POA: Diagnosis not present

## 2023-12-07 DIAGNOSIS — F419 Anxiety disorder, unspecified: Secondary | ICD-10-CM | POA: Diagnosis not present

## 2023-12-07 DIAGNOSIS — I10 Essential (primary) hypertension: Secondary | ICD-10-CM | POA: Diagnosis not present

## 2023-12-07 DIAGNOSIS — E669 Obesity, unspecified: Secondary | ICD-10-CM | POA: Diagnosis not present

## 2023-12-07 DIAGNOSIS — R7303 Prediabetes: Secondary | ICD-10-CM | POA: Diagnosis not present

## 2023-12-07 DIAGNOSIS — E781 Pure hyperglyceridemia: Secondary | ICD-10-CM | POA: Diagnosis not present

## 2023-12-07 DIAGNOSIS — R5383 Other fatigue: Secondary | ICD-10-CM | POA: Diagnosis not present

## 2023-12-07 DIAGNOSIS — K219 Gastro-esophageal reflux disease without esophagitis: Secondary | ICD-10-CM | POA: Diagnosis not present

## 2023-12-07 DIAGNOSIS — M542 Cervicalgia: Secondary | ICD-10-CM | POA: Diagnosis not present

## 2023-12-07 DIAGNOSIS — I8393 Asymptomatic varicose veins of bilateral lower extremities: Secondary | ICD-10-CM | POA: Diagnosis not present

## 2023-12-07 DIAGNOSIS — J302 Other seasonal allergic rhinitis: Secondary | ICD-10-CM | POA: Diagnosis not present

## 2023-12-07 DIAGNOSIS — G894 Chronic pain syndrome: Secondary | ICD-10-CM | POA: Diagnosis not present

## 2024-03-07 DIAGNOSIS — E669 Obesity, unspecified: Secondary | ICD-10-CM | POA: Diagnosis not present

## 2024-03-07 DIAGNOSIS — J302 Other seasonal allergic rhinitis: Secondary | ICD-10-CM | POA: Diagnosis not present

## 2024-03-07 DIAGNOSIS — G894 Chronic pain syndrome: Secondary | ICD-10-CM | POA: Diagnosis not present

## 2024-03-07 DIAGNOSIS — F419 Anxiety disorder, unspecified: Secondary | ICD-10-CM | POA: Diagnosis not present

## 2024-03-07 DIAGNOSIS — I10 Essential (primary) hypertension: Secondary | ICD-10-CM | POA: Diagnosis not present

## 2024-05-30 DIAGNOSIS — E781 Pure hyperglyceridemia: Secondary | ICD-10-CM | POA: Diagnosis not present

## 2024-05-30 DIAGNOSIS — Z125 Encounter for screening for malignant neoplasm of prostate: Secondary | ICD-10-CM | POA: Diagnosis not present

## 2024-05-30 DIAGNOSIS — R7303 Prediabetes: Secondary | ICD-10-CM | POA: Diagnosis not present

## 2024-06-05 DIAGNOSIS — M542 Cervicalgia: Secondary | ICD-10-CM | POA: Diagnosis not present

## 2024-06-05 DIAGNOSIS — F419 Anxiety disorder, unspecified: Secondary | ICD-10-CM | POA: Diagnosis not present

## 2024-06-05 DIAGNOSIS — Z0000 Encounter for general adult medical examination without abnormal findings: Secondary | ICD-10-CM | POA: Diagnosis not present

## 2024-06-05 DIAGNOSIS — R7303 Prediabetes: Secondary | ICD-10-CM | POA: Diagnosis not present

## 2024-06-05 DIAGNOSIS — E781 Pure hyperglyceridemia: Secondary | ICD-10-CM | POA: Diagnosis not present

## 2024-06-05 DIAGNOSIS — J069 Acute upper respiratory infection, unspecified: Secondary | ICD-10-CM | POA: Diagnosis not present

## 2024-06-05 DIAGNOSIS — I480 Paroxysmal atrial fibrillation: Secondary | ICD-10-CM | POA: Diagnosis not present

## 2024-06-05 DIAGNOSIS — J302 Other seasonal allergic rhinitis: Secondary | ICD-10-CM | POA: Diagnosis not present

## 2024-06-05 DIAGNOSIS — I10 Essential (primary) hypertension: Secondary | ICD-10-CM | POA: Diagnosis not present

## 2024-06-05 DIAGNOSIS — K219 Gastro-esophageal reflux disease without esophagitis: Secondary | ICD-10-CM | POA: Diagnosis not present

## 2024-06-05 DIAGNOSIS — R5383 Other fatigue: Secondary | ICD-10-CM | POA: Diagnosis not present

## 2024-06-05 DIAGNOSIS — G894 Chronic pain syndrome: Secondary | ICD-10-CM | POA: Diagnosis not present

## 2024-09-05 DIAGNOSIS — I10 Essential (primary) hypertension: Secondary | ICD-10-CM | POA: Diagnosis not present

## 2024-09-05 DIAGNOSIS — Z6835 Body mass index (BMI) 35.0-35.9, adult: Secondary | ICD-10-CM | POA: Diagnosis not present

## 2024-09-05 DIAGNOSIS — E669 Obesity, unspecified: Secondary | ICD-10-CM | POA: Diagnosis not present

## 2024-09-05 DIAGNOSIS — G894 Chronic pain syndrome: Secondary | ICD-10-CM | POA: Diagnosis not present

## 2024-11-07 DIAGNOSIS — G894 Chronic pain syndrome: Secondary | ICD-10-CM | POA: Diagnosis not present

## 2024-11-07 DIAGNOSIS — R319 Hematuria, unspecified: Secondary | ICD-10-CM | POA: Diagnosis not present

## 2024-11-07 DIAGNOSIS — Z6835 Body mass index (BMI) 35.0-35.9, adult: Secondary | ICD-10-CM | POA: Diagnosis not present

## 2024-11-07 DIAGNOSIS — E669 Obesity, unspecified: Secondary | ICD-10-CM | POA: Diagnosis not present

## 2024-11-07 DIAGNOSIS — R350 Frequency of micturition: Secondary | ICD-10-CM | POA: Diagnosis not present

## 2024-11-07 DIAGNOSIS — I10 Essential (primary) hypertension: Secondary | ICD-10-CM | POA: Diagnosis not present
